# Patient Record
Sex: Male | Born: 1945 | Race: White | Hispanic: No | Marital: Married | Smoking: Former smoker
Health system: Southern US, Community
[De-identification: ages and names within clinical notes are randomized; demographics above are authoritative.]

## PROBLEM LIST (undated history)

## (undated) DIAGNOSIS — H269 Unspecified cataract: Secondary | ICD-10-CM

## (undated) DIAGNOSIS — R911 Solitary pulmonary nodule: Secondary | ICD-10-CM

## (undated) DIAGNOSIS — J189 Pneumonia, unspecified organism: Secondary | ICD-10-CM

## (undated) DIAGNOSIS — J439 Emphysema, unspecified: Secondary | ICD-10-CM

## (undated) DIAGNOSIS — E785 Hyperlipidemia, unspecified: Secondary | ICD-10-CM

## (undated) DIAGNOSIS — D126 Benign neoplasm of colon, unspecified: Secondary | ICD-10-CM

## (undated) DIAGNOSIS — N529 Male erectile dysfunction, unspecified: Secondary | ICD-10-CM

## (undated) DIAGNOSIS — I739 Peripheral vascular disease, unspecified: Secondary | ICD-10-CM

## (undated) DIAGNOSIS — I10 Essential (primary) hypertension: Secondary | ICD-10-CM

## (undated) HISTORY — PX: COLONOSCOPY: SHX174

## (undated) HISTORY — DX: Hyperlipidemia, unspecified: E78.5

## (undated) HISTORY — DX: Male erectile dysfunction, unspecified: N52.9

## (undated) HISTORY — DX: Emphysema, unspecified: J43.9

## (undated) HISTORY — DX: Peripheral vascular disease, unspecified: I73.9

## (undated) HISTORY — DX: Unspecified cataract: H26.9

## (undated) HISTORY — DX: Essential (primary) hypertension: I10

## (undated) HISTORY — DX: Pneumonia, unspecified organism: J18.9

## (undated) HISTORY — DX: Benign neoplasm of colon, unspecified: D12.6

## (undated) HISTORY — DX: Solitary pulmonary nodule: R91.1

---

## 2001-02-07 ENCOUNTER — Encounter: Admission: RE | Admit: 2001-02-07 | Discharge: 2001-03-16 | Payer: Self-pay | Admitting: Internal Medicine

## 2002-06-14 ENCOUNTER — Emergency Department (HOSPITAL_COMMUNITY): Admission: EM | Admit: 2002-06-14 | Discharge: 2002-06-14 | Payer: Self-pay

## 2002-06-19 ENCOUNTER — Ambulatory Visit (HOSPITAL_COMMUNITY): Admission: RE | Admit: 2002-06-19 | Discharge: 2002-06-19 | Payer: Self-pay | Admitting: Vascular Surgery

## 2002-06-19 ENCOUNTER — Encounter: Payer: Self-pay | Admitting: Vascular Surgery

## 2002-06-21 ENCOUNTER — Inpatient Hospital Stay (HOSPITAL_COMMUNITY): Admission: RE | Admit: 2002-06-21 | Discharge: 2002-06-26 | Payer: Self-pay | Admitting: Vascular Surgery

## 2002-06-21 ENCOUNTER — Encounter: Payer: Self-pay | Admitting: Vascular Surgery

## 2002-06-21 ENCOUNTER — Encounter (INDEPENDENT_AMBULATORY_CARE_PROVIDER_SITE_OTHER): Payer: Self-pay | Admitting: *Deleted

## 2002-06-22 ENCOUNTER — Encounter: Payer: Self-pay | Admitting: Vascular Surgery

## 2002-08-17 HISTORY — PX: ABDOMINAL AORTIC ANEURYSM REPAIR: SHX42

## 2006-08-30 ENCOUNTER — Ambulatory Visit: Payer: Self-pay | Admitting: Internal Medicine

## 2006-09-08 ENCOUNTER — Ambulatory Visit: Payer: Self-pay | Admitting: Internal Medicine

## 2007-11-22 ENCOUNTER — Ambulatory Visit (HOSPITAL_BASED_OUTPATIENT_CLINIC_OR_DEPARTMENT_OTHER): Admission: RE | Admit: 2007-11-22 | Discharge: 2007-11-22 | Payer: Self-pay | Admitting: Otolaryngology

## 2007-11-22 ENCOUNTER — Encounter (INDEPENDENT_AMBULATORY_CARE_PROVIDER_SITE_OTHER): Payer: Self-pay | Admitting: Otolaryngology

## 2009-09-05 DIAGNOSIS — H9190 Unspecified hearing loss, unspecified ear: Secondary | ICD-10-CM | POA: Insufficient documentation

## 2009-09-05 DIAGNOSIS — N200 Calculus of kidney: Secondary | ICD-10-CM | POA: Insufficient documentation

## 2009-09-05 DIAGNOSIS — M5136 Other intervertebral disc degeneration, lumbar region: Secondary | ICD-10-CM | POA: Insufficient documentation

## 2009-09-05 DIAGNOSIS — M1990 Unspecified osteoarthritis, unspecified site: Secondary | ICD-10-CM | POA: Insufficient documentation

## 2009-09-05 DIAGNOSIS — E119 Type 2 diabetes mellitus without complications: Secondary | ICD-10-CM | POA: Insufficient documentation

## 2009-09-20 DIAGNOSIS — E538 Deficiency of other specified B group vitamins: Secondary | ICD-10-CM | POA: Insufficient documentation

## 2010-04-22 ENCOUNTER — Ambulatory Visit: Payer: Self-pay | Admitting: Cardiovascular Disease

## 2010-12-30 NOTE — Op Note (Signed)
NAMERONON, FERGER               ACCOUNT NO.:  [id number]   MEDICAL RECORD NO.:  [id number]          PATIENT TYPE:  AMB   LOCATION:  DSC                          FACILITY:  [facility]   PHYSICIAN:  Christopher E. Ezzard Standing, M.D.DATE OF BIRTH:  Jun 28, 1946   DATE OF PROCEDURE:  11/22/2007  DATE OF DISCHARGE:                               OPERATIVE REPORT   PREOPERATIVE DIAGNOSIS:  Squamous cell carcinoma, left lateral tongue.   POSTOPERATIVE DIAGNOSIS:  Squamous cell carcinoma, left lateral tongue.   OPERATION:  Wide excision of squamous cell carcinoma, left lateral  tongue, with CO2 laser.   SURGEON:  Narda Bonds, MD   ANESTHESIA:  General endotracheal.   COMPLICATIONS:  None.   CLINICAL NOTE:  Geoffrey Wood is a 65 year old gentleman who apparently  had abnormality noted on the left lateral tongue, noted by his dentist.  He was seen by Dr. Monia Pouch, who did a wide biopsy of this which revealed  well-differentiated squamous cell carcinoma with clear anterosuperior  margins, but a questionable positive posterior margin.  On examination,  Geoffrey Wood has a well-healed scar on the left lateral tongue, but has some  superficial areas of leukoplakia posterior to the scar in the more  posterior tongue.  He had a few small areas leukoplakia just above the  scar on the left lateral tongue.  He is taken to operating room at this  time for a wide excision of previous biopsy site as well as excision of  remaining of leukoplakia and adenopathy in the posterior left lateral  tongue.   DESCRIPTION OF PROCEDURE:  After adequate endotracheal anesthesia, the  left lateral tongue was evaluated with the microscope and the  abnormalities were marked out with a marking pen and then using CO2  laser at 5 and 6 watts, the mucosa down to the muscle layer was excised,  encompassing the previous scar tissue as well as the area of leukoplakia  posterior to the scar tissue.  A small suture was used to mark the  anterior portion of the excision site and a longer suture was used to  mark the posterior portion of the excision site.  Specimen was sent to  Pathology.  Hemostasis was obtained with electrocautery.  He was awoken  from anesthesia and transferred to the recovery room postop doing well.   DISPOSITION:  Geoffrey Wood is discharged home later this morning on amoxicillin  500 mg b.i.d. for 1 week, Tylenol and Vicodin p.r.n. pain as well as  topical anesthetic for pain control.  We will have him follow up in my  office in 7-10 days for recheck and to review pathology.           ______________________________  Kristine Garbe Ezzard Standing, M.D.     CEN/MEDQ  D:  11/22/2007  T:  11/22/2007  Job:  [id number]   cc:   Dora Sims, M.D.  Redge Gainer. Perini, M.D.

## 2011-01-02 NOTE — Op Note (Signed)
NAMECULLAN, LAUNER                           ACCOUNT NO.:  [id number]   MEDICAL RECORD NO.:  [id number]                   PATIENT TYPE:  INP   LOCATION:  NA                                   FACILITY:  [facility]   PHYSICIAN:  Di Kindle. Edilia Bo, M.D.        DATE OF BIRTH:  03-Apr-1946   DATE OF PROCEDURE:  06/21/2002  DATE OF DISCHARGE:                                 OPERATIVE REPORT   PREOPERATIVE DIAGNOSES:  Inflammatory abdominal aortic aneurysm.   POSTOPERATIVE DIAGNOSES:  Inflammatory abdominal aortic aneurysm.   OPERATION PERFORMED:  Repair of infrarenal inflammatory abdominal aortic  aneurysm with aortobi-iliac bypass graft (16 x 8 graft).   SURGEON:  Di Kindle. Edilia Bo, M.D.   ASSISTANT:  Maple Mirza, P.A.   ANESTHESIA:  General.   INDICATIONS FOR PROCEDURE:  The patient is a 65 year old gentleman who  presented to the emergency department with abdominal pain.  CAT scan showed  evidence of an inflammatory aneurysm but no evidence of leak.  He underwent  an arteriogram and preoperative cardiac evaluation and elective repair was  scheduled.  The patient was not felt to be a good candidate for endovascular  repair as the neck was fairly short and the patient was quite young.  The  procedure and potential complications were discussed with the patient in  detail preoperatively.   DESCRIPTION OF PROCEDURE:  The patient was taken to the operating room and  received a general anesthetic. A Swann-Ganz catheter and arterial line had  been placed by anesthesia.  The abdomen and groins were then prepped and  draped in the usual sterile fashion.  The abdomen was entered through a  midline incision and upon exploration, no other intra-abdominal pathology  was noted with the exception of the inflammatory aneurysm.  I reflected the  transverse colon superiorly and the small bowel to the right.  The duodenum  was carefully mobilized off of the inflammatory aneurysm.   Dissection was  difficult.  In dissecting out to the neck of the aneurysm, the inflammatory  component of the aneurysm was actually stuck to the inferior border of the  left renal vein making dissection difficult.  I was able to eventually  dissect this off and identify the neck clearly.  The vein was fully  mobilized and controlled with a blue vessel loop.  Distally both common  iliac arteries were exposed and umbilical tapes were passed around them such  that clamps could be placed.  The patient was then heparinized with 9000  units of IV heparin.  He also received 25 gm of mannitol.  Of note the  majority of the inflammatory component was quite firm.  There was one area  that was somewhat soft and to be safe, we did sent a frozen section which  showed no evidence of malignancy.  This was on the lateral aspect of the  aorta.   Next the common iliac arteries were clamped  and the infrarenal aorta  clamped.  I then Bovied through this thick inflammatory component of his  aneurysm.  This was approximately 2 cm thick.  The aneurysm was then opened  and lumbars were oversewn with 2-0 silk ties.  The aneurysm was divided  circumferentially proximally.  Distally, the right common iliac artery was  divided for anastomosis.  I then preserved the sympathetic plexus on the  left so I could tunnel the graft underneath this to the left common iliac  artery.  The graft was cut to the appropriate length and using a felt cuff,  the graft was sewn end-to-end to the infrarenal aorta using continuous 3-0  Prolene suture.  At completion, the anastomosis was tested and was  hemostatic.  The right limb of the graft was then pulled down for  anastomosis to the right common iliac artery.  The graft was cut to the  appropriate length, spatulated and sewn end-to-end to the artery using  continuous 5-0 Prolene suture.  Prior to completing the anastomosis, the  vessels were backbled and flushed and the anastomosis  completed.  Flow was  re-established to the right leg which the patient tolerated from the  hemodynamic standpoint.  Next, the graft was brought down beneath the  sympathetic plexus for anastomosis to the left common iliac artery which was  divided circumferentially.  The graft was cut to the appropriate length,  spatulated and sewn end-to-end to the left common iliac artery using  continuous 5-0 Prolene suture.  Prior to completing the anastomosis, the  vessels were backbled and flushed appropriately and the anastomosis was  completed.  Flow was re-established to the left leg which the patient  tolerated from the hemodynamic standpoint.  There were good femoral pulses  at the completion.  Hemostasis was obtained in the wounds.  The anastomosis  were hemostatic.  The thick inflammatory peel was closed over the aortic  graft with a running 2-0 Vicryl.  The retroperitoneal tissue was closed with  running 2-0 Vicryl.  The abdominal contents were returned to their normal  position and then the fascial layer was closed with two interrupted #1 PDS  sutures.  The skin was closed with staples.  Sterile dressing was applied.  The patient tolerated the procedure well and was transferred to the recovery  room in satisfactory condition.  All sponge and needle counts were correct.                                               Di Kindle. Edilia Bo, M.D.    CSD/MEDQ  D:  06/21/2002  T:  06/21/2002  Job:  [id number]   cc:   Loraine Leriche A. Perini, M.D.

## 2011-01-02 NOTE — Consult Note (Signed)
NAMEGENEVIEVE, RITZEL                           ACCOUNT NO.:  [id number]   MEDICAL RECORD NO.:  [id number]                   PATIENT TYPE:  EMS   LOCATION:  ED                                   FACILITY[facility]  [facility]   PHYSICIAN:  Di Kindle. Edilia Bo, M.D.        DATE OF BIRTH:  [birth year]   DATE OF CONSULTATION:  06/14/2002  DATE OF DISCHARGE:                                   CONSULTATION   VASCULAR SURGERY CONSULTATION:   REASON FOR CONSULTATION:  Inflammatory abdominal aortic aneurysm with back  pain.   HISTORY:  The patient is a pleasant 65 year old gentleman who has had  chronic low back pain since he was a child.  He apparently has significant  degenerative disk disease.  Over the last month his back pain has gradually  increased.  This morning he noted that his pain had also continued to  increase and was now radiating somewhat to his scrotum.  He has had no  abdominal pain.  He has had kidney stones in the past but did not note any  hematuria.  The pain is fairly constant and is gradually alleviated some.  He has had no associated symptoms.  Specifically he denies any nausea,  vomiting, diarrhea, constipation, melena, hematochezia, or other GI  symptoms.   The patient was seen in the emergency department at [facility] and a CAT  scan was obtained which showed an inflammatory abdominal aortic aneurysm.  There was no evidence of leak.   PAST MEDICAL HISTORY:  The patient's past medical history is significant for  type 2 diabetes (diet controlled), and hyperlipidemia.  He denies any  history of hypertension, history of previous myocardial infarction, or  history of congestive heart failure.   FAMILY HISTORY:  There is no history of aneurysmal disease that he is aware  of.  His mother died with congestive heart failure at age 78.  He is unaware  of any other history of premature cardiovascular disease.   SOCIAL HISTORY:  He works as a Transport planner and remains quite  active.  He  plays golf 2-3 times a week.  He remains fairly active.  He is married.  He  does not have any children.  He smokes a pack per day of cigarettes and has  been smoking for over 40 years.   ALLERGIES:  The patient has no known drug allergies.   MEDICATIONS:  1. Lipitor 10 mg p.o. q.d.  2. Aspirin 325 mg p.o. q.d.  3. Niacin 500 mg p.o. q.i.d.  4. Vitamin E 400 international units p.o. q.d.  5. Vitamin C 250 mg p.o. q.d.   REVIEW OF SYSTEMS:  The patient has had no recent weight loss, weight gain,  or problems with his appetite.  He has had no fever, night sweats, or  chills.  He has had no chest pain, chest pressure, shortness of breath,  orthopnea or dyspnea on exertion.  He  has had no history of bronchitis,  asthma, or wheezing.  He does have a history of kidney stones in the past  but has had no dysuria or frequency.  He has had no history of arthritis.  He does have degenerative joint disease as described above.  He has had no  history of claudication, rest pain, or nonhealing ulcers.  He has had no  history of DVT or pulmonary embolus.  He has had no headaches, seizures, or  blackouts.   PHYSICAL EXAMINATION:  GENERAL: The patient is in no obvious distress.  VITAL SIGNS: Temperature is 96.8, blood pressure 148/94, heart rate 94,  respiratory rate 20.  NECK: There is no cervical lymphadenopathy.  I do not detect any carotid  bruits.  LUNGS: Clear bilaterally to auscultation.  CARDIAC: Regular rate and rhythm.  ABDOMEN: Soft and nontender.  His aneurysm is palpable and nontender.  EXTREMITIES: He has palpable femoral, popliteal, dorsalis pedis and  posterior tibial pulses bilaterally.  There is no evidence of atheroembolic  disease.  He has no significant lower extremity swelling.  NEUROLOGIC: Nonfocal.   DIAGNOSTIC STUDIES:  I reviewed his CT scan which does show a small  inflammatory aneurysm.  When I measure the aneurysm, the maximum luminal  diameter is 3.0  cm.  If I include the inflammatory peel, the aneurysm  measures 5 cm in maximum diameter.  There is no evidence of leak.  He  appears to have an adequate length neck for endovascular repair of his  aneurysm if this is considered.  No other intra-abdominal pathology is  noted.  There is no hydronephrosis.   EKG shows a normal sinus rhythm.  Chest x-ray is unremarkable on my  interpretation.   LABORATORY EVALUATION:  No blood in the urine.  White count 7.7, H&H 14.3  and 40.8, platelet count 286.  Prothrombin time is 13.3 seconds with an INR  of 1.0.  PTT is 32 seconds.  Sodium 139, potassium 3.9, chloride 107 CO2 27,  glucose 136, BUN 9, creatinine 0.9.  AST and ALT are normal.  Alk phos is 94  and normal.  Total bili 0.06 and normal.  Amylase is 67, lipase is 50 - all  of which within normal limits.   IMPRESSION:  The patient does have a small infrarenal inflammatory abdominal  aortic aneurysm.  I explained to the patient and his wife that these  aneurysms do typically tend to be more likely to cause symptoms.  On the  other hand they are also less likely to rupture.  This aneurysm is fairly  small but given that this may be causing symptoms I think it does need to be  addressed electively.  There is currently no evidence of it leaking and I do  not think it needs to be repaired urgently.  I plan on proceeding with a  Cardiolite and arteriogram to plan elective repair.  We discussed the  options of operative repair versus an endovascular repair.  Specifically we  addressed the advantages and disadvantages of each.  Based on his CAT scan  it appears that he could potentially be a candidate for endovascular repair  although this will depend on the results of his arteriogram.  He tends to be  leaning in that direction.  He does understand that this would require  lifelong followup as there is a small risk of continued aneurysm enlargement with endovascular repair.  Given the inflammatory  nature of the aneurysm  however, I think this  would be reasonable.  We will make further  recommendations pending the results of his Cardiolite and arteriogram and  plan elective repair in the near future.  He knows to call immediately if he  has any increasing abdominal symptoms or back pain.  I have given him a  prescription for 30 Darvocet.                                               Di Kindle. Edilia Bo, M.D.    CSD/MEDQ  D:  06/14/2002  T:  06/14/2002  Job:  [id number]   cc:   Loraine Leriche A. Perini, M.D.   [facility]

## 2011-01-02 NOTE — Discharge Summary (Signed)
Geoffrey Wood, Geoffrey Wood                           ACCOUNT NO.:  [id number]   MEDICAL RECORD NO.:  [id number]                   PATIENT TYPE:  INP   LOCATION:  2037                                 FACILITY:  [facility]   PHYSICIAN:  Geoffrey Wood, M.D.        DATE OF BIRTH:  1946/04/23   DATE OF ADMISSION:  06/21/2002  DATE OF DISCHARGE:  06/26/2002                                 DISCHARGE SUMMARY   ADMISSION DIAGNOSIS:  Inflammatory abdominal aortic aneurysm.   DISCHARGE DIAGNOSES:  1. Inflammatory abdominal aortic aneurysm.  2. History of diet controlled diabetes mellitus.  3. Hyperlipidemia.  4. 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   PROCEDURE:  Repair of infrarenal inflammatory abdominal aortic aneurysm with  an aorto bi-iliac 16 x 8 bypass graft.   HISTORY OF PRESENT ILLNESS:  The patient is a 65 year old white male who  presented to the ER at [facility] approximately one week prior to  this admission complaining of abdominal pain which radiated into his back. A  CT scan was performed at that time which revealed an inflammatory abdominal  aortic aneurysm with no evidence of leakage. He was seen at that time by Dr.  Waverly Ferrari. The aneurysm measures around 5 cm in maximum  diameter.  It was felt that despite the small size of the aneurysm, it could  potentially be causing  his symptoms. It was felt that  he would benefit  from surgical  repair electively.  It was initially felt that he might be a  candidate for intravascular stent grafting, but upon further evaluation, it  was noted that the neck of the aneurysm was fairly short, therefore, making  him a poor candidate. He was referred for cardiac clearance and was seen at  [facility]. He was cleared to proceed with operative  intervention.   HOSPITAL COURSE:  He was admitted to [facility] on June 21, 2002, and was taken to the operating room where he underwent repair   of his  abdominal aortic aneurysm as described above. He tolerated the procedure  well and was transferred to the [facility] in stable condition. He was extubated  after surgery. He was kept n.p.o. initially until his bowel function began  to improve. He had  an  NG tube  overnight the first night postoperatively  but then this was removed on postoperative day #1. His pain control was well  maintained on a PCA pump.   By postoperative day #2 he was able to be transferred to the floor. Overall  he has done well. By postoperative day #2 he was  passing flatus. By  postoperative day #3 he had a small bowel movement. At that time he was  started on a clear liquid diet. He has tolerated this well and has continued  to have good bowel function, and by postoperative day #4, he has been  advanced to a soft regular  diet.   Overall his abdominal incision is healing well. His abdomen has remained  soft, nontender and nondistended. He has maintained good peripheral pulses  postoperatively. His potassium was noted to be a little low on postoperative  day #4 at 2.6. This is currently being replaced and will be reevaluated with  a basic metabolic panel on the day of discharge.   Otherwise he has done well. He has remained afebrile and vital signs have  been stable. He is ambulating in the halls without difficulty. He has been  weaned off supplemental oxygen and is maintaining oxygen sats of 95% on room  air. His IV fluids have been discontinued. He has been switched to p.o. pain  medication and this is controlling his pain quite well. It is felt that if  he continues to remain stable and tolerates a soft diet well he will be  ready for discharge home on June 26, 2002.   DISCHARGE MEDICATIONS:  1. Tylox 1 to 2 q.4h. p.r.n. pain.  2. Enteric coated aspirin 325 mg q.d.  3. Lipitor 10 mg q.d.  4. Vitamin E 400 IU q.d.  5. Niacin 2000 mg q.d.  6. Vitamin C 250 mg q.d.   DISCHARGE INSTRUCTIONS:  He is  to refrain from driving, heavy lifting or  strenuous activity. He may continue daily walking using his incentive  spirometer. He is asked to shower daily and clean his incisions in soap and  water.   FOLLOW UP:  He will be seen in the CVTS office in one week for staple  removal with one of the nurses. He will then follow up with Dr. Waverly Ferrari in three weeks and have ankle-brachial indices repeated at that  time. He is reminded to call  our office if he experiences any fever greater  than 101, purulent drainage from the incision or redness of the incision  site.     Coral Ceo, P.A.                        Geoffrey Wood, M.D.    GC/MEDQ  D:  06/25/2002  T:  06/26/2002  Job:  [id number]   cc:   Loraine Leriche A. Perini, M.D.   [facility]

## 2011-01-02 NOTE — Op Note (Signed)
NAMEMARON, STANZIONE                           ACCOUNT NO.:  [id number]   MEDICAL RECORD NO.:  [id number]                   PATIENT TYPE:  OIB   LOCATION:  2899                                 FACILITY:  [facility]   PHYSICIAN:  Di Kindle. Edilia Bo, M.D.        DATE OF BIRTH:  1945/12/04   DATE OF PROCEDURE:  06/19/2002  DATE OF DISCHARGE:                                 OPERATIVE REPORT   PREOPERATIVE DIAGNOSES:  Inflammatory abdominal aortic aneurysm.   POSTOPERATIVE DIAGNOSES:  Inflammatory abdominal aortic aneurysm.   OPERATION PERFORMED:  1. Aortogram.  2. Bilateral iliac arteriogram.  3. Bilateral lower extremity runoff.   SURGEON:  Di Kindle. Edilia Bo, M.D.   ANESTHESIA:  Local with sedation.   DESCRIPTION OF PROCEDURE:  The patient was taken to the operating room and  sedated with 1 mg of Versed and 50 mcg of fentanyl.  Both groins were  prepped and draped in the usual sterile fashion.  After the skin was  infiltrated with 1% lidocaine, the right common femoral artery was  cannulated and a guidewire introduced into the infrarenal aorta under  fluoroscopic control.  A 5 French sheath was passed over the wire and the  dilator was removed.  A marker pigtail catheter was positioned at the L1  vertebral body and flush aortogram obtained.  A lateral projection was also  obtained.  The catheter was then repositioned about the aortic bifurcation  and oblique iliac projections were obtained.  Bilateral lower extremity  runoff films were then obtained.   FINDINGS:  There were single renal arteries bilaterally and no significant  renal artery stenosis identified.  The superior mesenteric artery was widely  patent as was the inferior mesenteric artery.  There was an AP angulation of  the neck and on the lateral projection it appears that the length of the  neck from the lowest renal artery to the beginning of the aneurysm was  approximately 2 cm.  It only looks to be  approximately 1 cm on the AP  projection; however, on the lateral projection because of the AP angulation  of the neck, the neck is actually slightly longer.  The aneurysm ends at the  bifurcation.  Both common iliac, external iliac and hypogastric arteries are  widely patent.  The common femoral, superficial femoral and deep femoral  arteries are patent bilaterally.  The popliteal arteries are patent and  there is three-vessel runoff bilaterally.   CONCLUSION:  Infrarenal abdominal aortic aneurysm.  The size of the aneurysm  could not be determined by the study.  No evidence of significant aortoiliac  or infrainguinal arterial occlusive disease.  Widely patent celiac axis,  SMA, and inferior mesenteric arteries.  Di Kindle. Edilia Bo, M.D.   CSD/MEDQ  D:  06/19/2002  T:  06/19/2002  Job:  [id number]

## 2011-02-09 ENCOUNTER — Other Ambulatory Visit: Payer: Self-pay | Admitting: *Deleted

## 2011-02-09 MED ORDER — RAMIPRIL 5 MG PO CAPS: 5.0000 mg | ORAL_CAPSULE | Freq: Every day | ORAL | Status: DC

## 2011-02-09 NOTE — Telephone Encounter (Signed)
Fax received from pharmacy. Refill completed. Jodette Ruthmary Occhipinti RN  

## 2011-04-13 ENCOUNTER — Encounter: Payer: Self-pay | Admitting: Cardiovascular Disease

## 2011-04-21 ENCOUNTER — Encounter: Payer: Self-pay | Admitting: Cardiovascular Disease

## 2011-04-21 ENCOUNTER — Ambulatory Visit (INDEPENDENT_AMBULATORY_CARE_PROVIDER_SITE_OTHER): Payer: 59 | Admitting: Cardiovascular Disease

## 2011-04-21 VITALS — BP 118/72 | HR 63 | Ht 70.0 in | Wt 197.6 lb

## 2011-04-21 DIAGNOSIS — E785 Hyperlipidemia, unspecified: Secondary | ICD-10-CM | POA: Insufficient documentation

## 2011-04-21 DIAGNOSIS — I10 Essential (primary) hypertension: Secondary | ICD-10-CM

## 2011-04-21 NOTE — Assessment & Plan Note (Addendum)
Stable.  We'll continue with the same medications. We'll recheck his fasting lipid profile in one year unless it is checked by general medical Dr.

## 2011-04-21 NOTE — Assessment & Plan Note (Signed)
Stable. Continue his current meds

## 2011-04-21 NOTE — Progress Notes (Signed)
Hardie Shackleton Date of Birth  January 17, 1946 [facility] / [facility] [address]     [address] [place], [place]  [address] [phone]  Fax  [phone]  History of Present Illness:  Geoffrey Wood is a 65 year old gentleman with a history of hypertension, hyperlipidemia, and peripheral vascular disease. He's done well since I saw him a year ago. Unfortunately, he continues to smoke. He's not had any episodes of chest pain or shortness of breath.  Current Outpatient Prescriptions on File Prior to Visit  Medication Sig Dispense Refill  . Ascorbic Acid (VITAMIN C PO) Take by mouth daily.        Marland Kitchen aspirin 81 MG tablet Take 81 mg by mouth daily.        Marland Kitchen atorvastatin (LIPITOR) 40 MG tablet Take 80 mg by mouth daily.       . Cholecalciferol (VITAMIN D PO) Take by mouth daily.        . metFORMIN (GLUMETZA) 1000 MG (MOD) 24 hr tablet Take 1,000 mg by mouth daily with breakfast.        . niacin 500 MG tablet Take 500 mg by mouth 3 (three) times daily.        . Omega-3-acid Ethyl Esters (LOVAZA PO) Take by mouth 2 (two) times daily.        . ramipril (ALTACE) 5 MG capsule Take 1 capsule (5 mg total) by mouth daily.  90 capsule  0    No Known Allergies  Past Medical History  Diagnosis Date  . Hypertension   . Peripheral vascular disease   . Erectile dysfunction   . Hyperlipidemia   . Diabetes mellitus   . Emphysema     Past Surgical History  Procedure Date  . Abdominal aortic aneurysm repair 2004    History  Smoking status  . Current Everyday Smoker  Smokeless tobacco  . Not on file    History  Alcohol Use  . Yes    Family History  Problem Relation Age of Onset  . Heart failure Mother   . Diabetes Mother   . Cancer Father     lung    Reviw of Systems:  Reviewed in the HPI.  All other systems are negative.  Physical Exam: BP 118/72  Pulse 63  Ht 5\' 10"  (1.778 m)  Wt 197 lb 9.6 oz (89.631 kg)  BMI 28.35 kg/m2 The patient is alert and  oriented x 3.  The mood and affect are normal.   Skin: warm and dry.  Color is normal.    HEENT:   the sclera are nonicteric.  The mucous membranes are moist.  The carotids are 2+ without bruits.  There is no thyromegaly.  There is no JVD.    Lungs: clear.  The chest wall is non tender.    Heart: regular rate with a normal S1 and S2.  There are no murmurs, gallops, or rubs. The PMI is not displaced.     Abdomen: good bowel sounds.  There is no guarding or rebound.  There is no hepatosplenomegaly or tenderness.  There are no masses.   Extremities:  no clubbing, cyanosis, or edema.  The legs are without rashes.  The distal pulses are intact.   Neuro:  Cranial nerves II - XII are intact.  Motor and sensory functions are intact.    The gait is normal.  ECG: Normal sinus rhythm.  Assessment / Plan:

## 2011-05-12 LAB — BASIC METABOLIC PANEL
BUN: 8
Calcium: 9.3
GFR calc non Af Amer: >60
Glucose, Bld: 211 — ABNORMAL HIGH
Potassium: 4.1

## 2011-09-11 ENCOUNTER — Encounter: Payer: Self-pay | Admitting: Internal Medicine

## 2011-12-02 ENCOUNTER — Encounter: Payer: Self-pay | Admitting: Internal Medicine

## 2011-12-10 ENCOUNTER — Ambulatory Visit (AMBULATORY_SURGERY_CENTER): Payer: 59 | Admitting: *Deleted

## 2011-12-10 ENCOUNTER — Encounter: Payer: Self-pay | Admitting: Internal Medicine

## 2011-12-10 VITALS — Ht 72.0 in | Wt 200.0 lb

## 2011-12-10 DIAGNOSIS — Z1211 Encounter for screening for malignant neoplasm of colon: Secondary | ICD-10-CM

## 2011-12-10 MED ORDER — PEG-KCL-NACL-NASULF-NA ASC-C 100 G PO SOLR: ORAL | Status: DC

## 2011-12-24 ENCOUNTER — Encounter: Payer: Self-pay | Admitting: Internal Medicine

## 2011-12-24 ENCOUNTER — Ambulatory Visit (AMBULATORY_SURGERY_CENTER): Payer: 59 | Admitting: Internal Medicine

## 2011-12-24 VITALS — BP 136/77 | HR 65 | Temp 97.5°F | Resp 19 | Ht 72.0 in | Wt 200.0 lb

## 2011-12-24 DIAGNOSIS — D126 Benign neoplasm of colon, unspecified: Secondary | ICD-10-CM

## 2011-12-24 DIAGNOSIS — K648 Other hemorrhoids: Secondary | ICD-10-CM

## 2011-12-24 DIAGNOSIS — Z860100 Personal history of colon polyps, unspecified: Secondary | ICD-10-CM | POA: Insufficient documentation

## 2011-12-24 DIAGNOSIS — Z1211 Encounter for screening for malignant neoplasm of colon: Secondary | ICD-10-CM

## 2011-12-24 DIAGNOSIS — K5730 Diverticulosis of large intestine without perforation or abscess without bleeding: Secondary | ICD-10-CM

## 2011-12-24 MED ORDER — SODIUM CHLORIDE 0.9 % IV SOLN: 500.0000 mL | INTRAVENOUS | Status: DC

## 2011-12-24 NOTE — Op Note (Signed)
 [facility] [address] [place], [place]  [address]  COLONOSCOPY PROCEDURE REPORT  PATIENT:  Geoffrey Wood, Geoffrey Wood  MR#:  [id number] BIRTHDATE:  [birth year], 66 yrs. old  GENDER:  male ENDOSCOPIST:  Iva Boop, MD, [facility]  PROCEDURE DATE:  12/24/2011 PROCEDURE:  Colonoscopy with biopsy and snare polypectomy ASA CLASS:  Class II INDICATIONS:  surveillance and high-risk screening, history of pre-cancerous (adenomatous) colon polyps 4 adenomas largest 12 mm 2004 1 diminutive adenoma 2005 no polyps 2008 MEDICATIONS:   These medications were titrated to patient response per physician's verbal order, Fentanyl 50 mcg IV, Versed 8 mg IV  DESCRIPTION OF PROCEDURE:   After the risks benefits and alternatives of the procedure were thoroughly explained, informed consent was obtained.  Digital rectal exam was performed and revealed no abnormalities and normal prostate.   The LB [id number] endoscope was introduced through the anus and advanced to the cecum, which was identified by both the appendix and ileocecal valve, without limitations.  The quality of the prep was excellent, using MoviPrep.  The instrument was then slowly withdrawn as the colon was fully examined. <<PROCEDUREIMAGES>>  FINDINGS:  Four polyps were found. Cecum (2 mm cold biopsy removal), transverse (4mm cold snare not recovered), sigmoid (2 4-5 mm cold snare).  Moderate diverticulosis was found in the sigmoid colon.  This was otherwise a normal examination of the colon. Includes right colon retroflexion.   Retroflexed views in the rectum revealed internal hemorrhoids.    The time to cecum = 4:40 minutes. The scope was then withdrawn in 13:34 minutes from the cecum and the procedure completed. COMPLICATIONS:  None ENDOSCOPIC IMPRESSION: 1) Four polyps removed, all diminutive. One not recovered. 2) Moderate diverticulosis in the sigmoid colon 3) Internal hemorrhoids 4) Otherwise normal examination,  excellent prep 5) Personal history of adenomatous polyp removal  REPEAT EXAM:  In for Colonoscopy, pending biopsy results.  Iva Boop, MD, [facility]  CC:  The Patient and Rodrigo Ran, MD  n. Rosalie Doctor:   Iva Boop at 12/24/2011 12:33 PM  Rae Lips, [phone]

## 2011-12-24 NOTE — Patient Instructions (Signed)
YOU HAD AN ENDOSCOPIC PROCEDURE TODAY AT THE [facility]: Refer to the procedure report that was given to you for any specific questions about what was found during the examination.  If the procedure report does not answer your questions, please call your gastroenterologist to clarify.  If you requested that your care partner not be given the details of your procedure findings, then the procedure report has been included in a sealed envelope for you to review at your convenience later.  YOU SHOULD EXPECT: Some feelings of bloating in the abdomen. Passage of more gas than usual.  Walking can help get rid of the air that was put into your GI tract during the procedure and reduce the bloating. If you had a lower endoscopy (such as a colonoscopy or flexible sigmoidoscopy) you may notice spotting of blood in your stool or on the toilet paper. If you underwent a bowel prep for your procedure, then you may not have a normal bowel movement for a few days.  DIET: Your first meal following the procedure should be a light meal and then it is ok to progress to your normal diet.  A half-sandwich or bowl of soup is an example of a good first meal.  Heavy or fried foods are harder to digest and may make you feel nauseous or bloated.  Likewise meals heavy in dairy and vegetables can cause extra gas to form and this can also increase the bloating.  Drink plenty of fluids but you should avoid alcoholic beverages for 24 hours.  ACTIVITY: Your care partner should take you home directly after the procedure.  You should plan to take it easy, moving slowly for the rest of the day.  You can resume normal activity the day after the procedure however you should NOT DRIVE or use heavy machinery for 24 hours (because of the sedation medicines used during the test).    SYMPTOMS TO REPORT IMMEDIATELY: A gastroenterologist can be reached at any hour.  During normal business hours, 8:30 AM to 5:00 PM Monday through Friday,  call [phone].  After hours and on weekends, please call the GI answering service at [phone] who will take a message and have the physician on call contact you.   Following lower endoscopy (colonoscopy or flexible sigmoidoscopy):  Excessive amounts of blood in the stool  Significant tenderness or worsening of abdominal pains  Swelling of the abdomen that is new, acute  Fever of 100F or higher  Following upper endoscopy (EGD)  Vomiting of blood or coffee ground material  New chest pain or pain under the shoulder blades  Painful or persistently difficult swallowing  New shortness of breath  Fever of 100F or higher  Black, tarry-looking stools  FOLLOW UP: If any biopsies were taken you will be contacted by phone or by letter within the next 1-3 weeks.  Call your gastroenterologist if you have not heard about the biopsies in 3 weeks.  Our staff will call the home number listed on your records the next business day following your procedure to check on you and address any questions or concerns that you may have at that time regarding the information given to you following your procedure. This is a courtesy call and so if there is no answer at the home number and we have not heard from you through the emergency physician on call, we will assume that you have returned to your regular daily activities without incident.  SIGNATURES/CONFIDENTIALITY: You and/or your care   partner have signed paperwork which will be entered into your electronic medical record.  These signatures attest to the fact that that the information above on your After Visit Summary has been reviewed and is understood.  Full responsibility of the confidentiality of this discharge information lies with you and/or your care-partner.  

## 2011-12-24 NOTE — Progress Notes (Signed)
Patient did not have preoperative order for IV antibiotic SSI prophylaxis. (G8918)  Patient did not experience any of the following events: a burn prior to discharge; a fall within the facility; wrong site/side/patient/procedure/implant event; or a hospital transfer or hospital admission upon discharge from the facility. (G8907)  

## 2011-12-25 ENCOUNTER — Telehealth: Payer: Self-pay

## 2011-12-25 NOTE — Telephone Encounter (Signed)
  Follow up Call-  Call back number 12/24/2011  Post procedure Call Back phone  # [phone]  Permission to leave phone message Yes     Patient questions:  Do you have a fever, pain , or abdominal swelling? no Pain Score  0 *  Have you tolerated food without any problems? yes  Have you been able to return to your normal activities? yes  Do you have any questions about your discharge instructions: Diet   no Medications  no Follow up visit  no  Do you have questions or concerns about your Care? no  Actions: * If pain score is 4 or above: No action needed, pain <4.

## 2011-12-28 LAB — GLUCOSE, CAPILLARY

## 2011-12-31 ENCOUNTER — Encounter: Payer: Self-pay | Admitting: Internal Medicine

## 2011-12-31 NOTE — Progress Notes (Signed)
Quick Note:  2 diminutive adenomas Repeat colonoscopy 5 years about 12/2016 ______

## 2012-06-20 ENCOUNTER — Ambulatory Visit (INDEPENDENT_AMBULATORY_CARE_PROVIDER_SITE_OTHER): Payer: 59 | Admitting: Cardiovascular Disease

## 2012-06-20 ENCOUNTER — Encounter: Payer: Self-pay | Admitting: Cardiovascular Disease

## 2012-06-20 VITALS — BP 126/88 | HR 76 | Ht 72.0 in | Wt 202.0 lb

## 2012-06-20 DIAGNOSIS — I10 Essential (primary) hypertension: Secondary | ICD-10-CM

## 2012-06-20 MED ORDER — RAMIPRIL 5 MG PO CAPS: 5.0000 mg | ORAL_CAPSULE | Freq: Every day | ORAL | Status: DC

## 2012-06-20 NOTE — Assessment & Plan Note (Signed)
Geoffrey Wood is doing well.  His BP has been well controlled.  I have asked him to exercise more regularly.  Will see him in a month.

## 2012-06-20 NOTE — Patient Instructions (Signed)
Your physician wants you to follow-up in: 1 year  You will receive a reminder letter in the mail two months in advance. If you don't receive a letter, please call our office to schedule the follow-up appointment.   Your physician recommends that you return for a FASTING lipid profile: 1 year   

## 2012-06-20 NOTE — Progress Notes (Signed)
Geoffrey Wood Date of Birth  October 23, 1945 [facility] [address]     [address] [place], [place]  [address] [phone]  Fax  [phone]  Problem List 1. HTN 2. Hyperlipidemia 3. PVD 4. Diabetes Mellitus   History of Present Illness:  Geoffrey Wood is a 66 year old gentleman with a history of hypertension, hyperlipidemia, and peripheral vascular disease. He's done well since I saw him a year ago. Unfortunately, he continues to smoke. He's not had any episodes of chest pain or shortness of breath.  Playing lots of golf.  He still works - Building services engineer.  He still smokes - he's trying the new E-cigarettes.   Current Outpatient Prescriptions on File Prior to Visit  Medication Sig Dispense Refill  . aspirin 81 MG tablet Take 81 mg by mouth daily.        . Cholecalciferol (VITAMIN D PO) Take 1,000 Units by mouth daily.       . Cyanocobalamin (VITAMIN B 12 PO) Take 500 mcg by mouth daily.       Marland Kitchen LIPITOR 80 MG tablet Take 1 tablet by mouth Daily.      Marland Kitchen LOVAZA 1 G capsule Take 1 capsule by mouth 4 times daily.      . metFORMIN (GLUMETZA) 1000 MG (MOD) 24 hr tablet Take 1,000 mg by mouth 2 (two) times daily before lunch and supper.       . niacin 500 MG tablet Take 500 mg by mouth 4 (four) times daily.        Current Facility-Administered Medications on File Prior to Visit  Medication Dose Route Frequency Provider Last Rate Last Dose  . 0.9 %  sodium chloride infusion  500 mL Intravenous Continuous Iva Boop, MD        No Known Allergies  Past Medical History  Diagnosis Date  . Hypertension   . Peripheral vascular disease   . Erectile dysfunction   . Hyperlipidemia   . Diabetes mellitus   . Emphysema   . Adenomatous colon polyp     Past Surgical History  Procedure Date  . Abdominal aortic aneurysm repair 2004  . Colonoscopy multiple since 2004    History  Smoking status  . Current Every Day Smoker -- 1.0 packs/day    Smokeless tobacco  . Never Used    History  Alcohol Use  . Yes    Comment: not even 1 drink a week    Family History  Problem Relation Age of Onset  . Heart failure Mother   . Diabetes Mother   . Cancer Father     lung    Reviw of Systems:  Reviewed in the HPI.  All other systems are negative.  Physical Exam: BP 126/88  Pulse 76  Ht 6' (1.829 m)  Wt 202 lb (91.627 kg)  BMI 27.40 kg/m2  SpO2 96% The patient is alert and oriented x 3.  The mood and affect are normal.   Skin: warm and dry.  Color is normal.    HEENT:   the sclera are nonicteric.  The mucous membranes are moist.  The carotids are 2+ without bruits.  There is no thyromegaly.  There is no JVD.    Lungs: clear.  The chest wall is non tender.    Heart: regular rate with a normal S1 and S2.  There are no murmurs, gallops, or rubs. The PMI is not displaced.     Abdomen: good bowel  sounds.  There is no guarding or rebound.  There is no hepatosplenomegaly or tenderness.  There are no masses.   Extremities:  no clubbing, cyanosis, or edema.  The legs are without rashes.  The distal pulses are intact.   Neuro:  Cranial nerves II - XII are intact.  Motor and sensory functions are intact.    The gait is normal.  ECG:  Assessment / Plan:

## 2013-02-23 DIAGNOSIS — N529 Male erectile dysfunction, unspecified: Secondary | ICD-10-CM | POA: Insufficient documentation

## 2013-06-09 ENCOUNTER — Other Ambulatory Visit: Payer: Self-pay | Admitting: Dermatology

## 2013-06-20 ENCOUNTER — Encounter: Payer: Self-pay | Admitting: Cardiovascular Disease

## 2013-06-29 ENCOUNTER — Ambulatory Visit (INDEPENDENT_AMBULATORY_CARE_PROVIDER_SITE_OTHER): Payer: 59 | Admitting: Cardiovascular Disease

## 2013-06-29 ENCOUNTER — Encounter: Payer: Self-pay | Admitting: Cardiovascular Disease

## 2013-06-29 ENCOUNTER — Encounter (INDEPENDENT_AMBULATORY_CARE_PROVIDER_SITE_OTHER): Payer: Self-pay

## 2013-06-29 VITALS — BP 131/84 | HR 80 | Ht 72.0 in | Wt 192.8 lb

## 2013-06-29 DIAGNOSIS — Z23 Encounter for immunization: Secondary | ICD-10-CM

## 2013-06-29 DIAGNOSIS — I10 Essential (primary) hypertension: Secondary | ICD-10-CM

## 2013-06-29 MED ORDER — RAMIPRIL 5 MG PO CAPS: 5.0000 mg | ORAL_CAPSULE | Freq: Every day | ORAL | Status: DC

## 2013-06-29 NOTE — Progress Notes (Signed)
Hardie Shackleton Date of Birth  07-26-1946 [facility] [address]     [address] [place], [place]  [address] [phone]  Fax  [phone]  Problem List 1. HTN 2. Hyperlipidemia 3. PVD 4. Diabetes Mellitus   History of Present Illness:  Geoffrey Wood is a 67 year old gentleman with a history of hypertension, hyperlipidemia, and peripheral vascular disease. He's done well since I saw him a year ago. Unfortunately, he continues to smoke. He's not had any episodes of chest pain or shortness of breath.  Playing lots of golf.  He still works - Building services engineer.  He still smokes - he's trying the new E-cigarettes.   Nov. 13, 2014:  Not much new.  Still smoiking  Current Outpatient Prescriptions on File Prior to Visit  Medication Sig Dispense Refill  . aspirin 81 MG tablet Take 81 mg by mouth daily.        . Cholecalciferol (VITAMIN D PO) Take 1,000 Units by mouth daily.       . Cyanocobalamin (VITAMIN B 12 PO) Take 500 mcg by mouth daily.       Marland Kitchen LIPITOR 80 MG tablet Take 1 tablet by mouth Daily.      Marland Kitchen LOVAZA 1 G capsule Take 1 capsule by mouth 4 times daily.      . metFORMIN (GLUMETZA) 1000 MG (MOD) 24 hr tablet Take 1,000 mg by mouth 2 (two) times daily before lunch and supper.       . niacin 500 MG tablet Take 500 mg by mouth 4 (four) times daily.       . ramipril (ALTACE) 5 MG capsule Take 1 capsule (5 mg total) by mouth daily.  30 capsule  11   No current facility-administered medications on file prior to visit.    No Known Allergies  Past Medical History  Diagnosis Date  . Hypertension   . Peripheral vascular disease   . Erectile dysfunction   . Hyperlipidemia   . Diabetes mellitus   . Emphysema   . Adenomatous colon polyp     Past Surgical History  Procedure Laterality Date  . Abdominal aortic aneurysm repair  2004  . Colonoscopy  multiple since 2004    History  Smoking status  . Current Every Day Smoker -- 1.00  packs/day  Smokeless tobacco  . Never Used    History  Alcohol Use  . Yes    Comment: not even 1 drink a week    Family History  Problem Relation Age of Onset  . Heart failure Mother   . Diabetes Mother   . Cancer Father     lung    Reviw of Systems:  Reviewed in the HPI.  All other systems are negative.  Physical Exam: BP 131/84  Pulse 80  Ht 6' (1.829 m)  Wt 192 lb 12.8 oz (87.454 kg)  BMI 26.14 kg/m2 The patient is alert and oriented x 3.  The mood and affect are normal.   Skin: warm and dry.  Color is normal.    HEENT:   the sclera are nonicteric.  The mucous membranes are moist.  The carotids are 2+ without bruits.  There is no thyromegaly.  There is no JVD.    Lungs: clear.  The chest wall is non tender.    Heart: regular rate with a normal S1 and S2.  There are no murmurs, gallops, or rubs. The PMI is not displaced.  Abdomen: good bowel sounds.  There is no guarding or rebound.  There is no hepatosplenomegaly or tenderness.  There are no masses.   Extremities:  no clubbing, cyanosis, or edema.  The legs are without rashes.  The distal pulses are intact.   Neuro:  Cranial nerves II - XII are intact.  Motor and sensory functions are intact.    The gait is normal.  ECG: Nov. 13, 2014:  NSR at 63; LVH  Assessment / Plan:

## 2013-06-29 NOTE — Patient Instructions (Signed)
Your physician wants you to follow-up in: 1 year  You will receive a reminder letter in the mail two months in advance. If you don't receive a letter, please call our office to schedule the follow-up appointment.  Your physician recommends that you continue on your current medications as directed. Please refer to the Current Medication list given to you today.  

## 2013-06-29 NOTE — Assessment & Plan Note (Signed)
Geoffrey Wood is doing well.  Unfortunately, he is still smoking.   Will continue his current meds   Ill see him in 1 year

## 2013-08-28 ENCOUNTER — Other Ambulatory Visit: Payer: Self-pay | Admitting: Internal Medicine

## 2013-08-28 DIAGNOSIS — J438 Other emphysema: Secondary | ICD-10-CM

## 2013-08-29 ENCOUNTER — Other Ambulatory Visit: Payer: Self-pay | Admitting: Internal Medicine

## 2013-08-29 DIAGNOSIS — F17200 Nicotine dependence, unspecified, uncomplicated: Secondary | ICD-10-CM

## 2013-08-30 ENCOUNTER — Other Ambulatory Visit: Payer: 59

## 2013-08-30 ENCOUNTER — Inpatient Hospital Stay: Admission: RE | Admit: 2013-08-30 | Payer: 59 | Source: Ambulatory Visit

## 2013-09-05 ENCOUNTER — Ambulatory Visit
Admission: RE | Admit: 2013-09-05 | Discharge: 2013-09-05 | Disposition: A | Discharge status: Home / Self Care | Payer: No Typology Code available for payment source | Source: Ambulatory Visit | Attending: Internal Medicine | Admitting: Internal Medicine

## 2013-09-05 DIAGNOSIS — R918 Other nonspecific abnormal finding of lung field: Secondary | ICD-10-CM | POA: Insufficient documentation

## 2013-09-05 DIAGNOSIS — F17200 Nicotine dependence, unspecified, uncomplicated: Secondary | ICD-10-CM

## 2013-09-07 ENCOUNTER — Other Ambulatory Visit (HOSPITAL_COMMUNITY): Payer: Self-pay | Admitting: Internal Medicine

## 2013-09-07 DIAGNOSIS — R918 Other nonspecific abnormal finding of lung field: Secondary | ICD-10-CM

## 2013-09-20 ENCOUNTER — Encounter (HOSPITAL_COMMUNITY): Payer: 59

## 2013-10-02 ENCOUNTER — Encounter (HOSPITAL_COMMUNITY)
Admission: RE | Admit: 2013-10-02 | Discharge: 2013-10-02 | Disposition: A | Discharge status: Home / Self Care | Payer: 59 | Source: Ambulatory Visit | Attending: Internal Medicine | Admitting: Internal Medicine

## 2013-10-02 DIAGNOSIS — I7090 Unspecified atherosclerosis: Secondary | ICD-10-CM | POA: Insufficient documentation

## 2013-10-02 DIAGNOSIS — R918 Other nonspecific abnormal finding of lung field: Secondary | ICD-10-CM | POA: Insufficient documentation

## 2013-10-02 DIAGNOSIS — Z87891 Personal history of nicotine dependence: Secondary | ICD-10-CM | POA: Insufficient documentation

## 2013-10-02 DIAGNOSIS — J438 Other emphysema: Secondary | ICD-10-CM | POA: Insufficient documentation

## 2013-10-02 DIAGNOSIS — J984 Other disorders of lung: Secondary | ICD-10-CM | POA: Insufficient documentation

## 2013-10-02 LAB — GLUCOSE, CAPILLARY: GLUCOSE-CAPILLARY: 168 mg/dL — AB (ref 70–99)

## 2013-10-02 MED ORDER — FLUDEOXYGLUCOSE F - 18 (FDG) INJECTION
9.9000 | Freq: Once | INTRAVENOUS | Status: AC | PRN
  Administered 2013-10-02: 9.9 via INTRAVENOUS

## 2014-01-16 ENCOUNTER — Encounter: Payer: Self-pay | Admitting: Cardiovascular Disease

## 2014-01-16 ENCOUNTER — Ambulatory Visit (INDEPENDENT_AMBULATORY_CARE_PROVIDER_SITE_OTHER): Payer: 59 | Admitting: Cardiovascular Disease

## 2014-01-16 VITALS — BP 120/78 | HR 85 | Ht 72.0 in | Wt 192.8 lb

## 2014-01-16 DIAGNOSIS — E785 Hyperlipidemia, unspecified: Secondary | ICD-10-CM

## 2014-01-16 DIAGNOSIS — I10 Essential (primary) hypertension: Secondary | ICD-10-CM

## 2014-01-16 NOTE — Assessment & Plan Note (Signed)
Stable.  Followed by Dr. Joylene Draft

## 2014-01-16 NOTE — Progress Notes (Signed)
[name] Date of Birth  01-Mar-1946 [facility] [address]     [place], [place]  [address] [phone]  Fax  [phone]  Problem List 1. HTN 2. Hyperlipidemia 3. PVD 4. Diabetes Mellitus 5. AAA repair -  Gae Gallop, MD ( 2003)     History of Present Illness:  Argelio is a 68 year old gentleman with a history of hypertension, hyperlipidemia, and peripheral vascular disease. He's done well since I saw him a year ago. Unfortunately, he continues to smoke. He's not had any episodes of chest pain or shortness of breath.  Playing lots of golf.  He still works - Passenger transport manager.  He still smokes - he's trying the new E-cigarettes.   Nov. 13, 2014:  Not much new.  Still smoiking  January 16, 2014:  Ezana is doing well.  He has stopped smoking and is using an e-cig.  Starting an exercise program.   No CP so far.  Walking on the treadmill - up to 15-20 minutes a day.    Still working.       Current Outpatient Prescriptions on File Prior to Visit  Medication Sig Dispense Refill  . aspirin 81 MG tablet Take 81 mg by mouth daily.        . Cholecalciferol (VITAMIN D PO) Take 1,000 Units by mouth daily.       . Cyanocobalamin (VITAMIN B 12 PO) Take 500 mcg by mouth daily.       Marland Kitchen LIPITOR 80 MG tablet Take 1 tablet by mouth Daily.      Marland Kitchen LOVAZA 1 G capsule Take 1 capsule by mouth 4 times daily.      . metFORMIN (GLUMETZA) 1000 MG (MOD) 24 hr tablet Take 1,000 mg by mouth 2 (two) times daily before lunch and supper.       . NESINA 25 MG TABS Take 1 tablet by mouth daily.      . niacin 500 MG tablet Take 500 mg by mouth 4 (four) times daily.       . ramipril (ALTACE) 5 MG capsule Take 1 capsule (5 mg total) by mouth daily.  30 capsule  11  . ZETIA 10 MG tablet Take 1/2 tablet once a day       No current facility-administered medications on file prior to visit.    No Known Allergies  Past Medical History  Diagnosis Date   . Hypertension   . Peripheral vascular disease   . Erectile dysfunction   . Hyperlipidemia   . Diabetes mellitus   . Emphysema   . Adenomatous colon polyp     Past Surgical History  Procedure Laterality Date  . Abdominal aortic aneurysm repair  2004  . Colonoscopy  multiple since 2004    History  Smoking status  . Current Every Day Smoker -- 1.00 packs/day  Smokeless tobacco  . Never Used    History  Alcohol Use  . Yes    Comment: not even 1 drink a week    Family History  Problem Relation Age of Onset  . Heart failure Mother   . Diabetes Mother   . Cancer Father     lung    Reviw of Systems:  Reviewed in the HPI.  All other systems are negative.  Physical Exam: BP 120/78  Pulse 85  Ht 6' (1.829 m)  Wt 192 lb 12.8 oz (87.454 kg)  BMI 26.14 kg/m2  SpO2 95%  The patient is alert and oriented x 3.  The mood and affect are normal.   Skin: warm and dry.  Color is normal.    HEENT:   the sclera are nonicteric.  The mucous membranes are moist.  The carotids are 2+ without bruits.  There is no thyromegaly.  There is no JVD.    Lungs: clear.  The chest wall is non tender.    Heart: regular rate with a normal S1 and S2.  There are no murmurs, gallops, or rubs. The PMI is not displaced.     Abdomen: good bowel sounds.  There is no guarding or rebound.  There is no hepatosplenomegaly or tenderness.  There are no masses.   Extremities:  no clubbing, cyanosis, or edema.  The legs are without rashes.  The distal pulses are intact.   Neuro:  Cranial nerves II - XII are intact.  Motor and sensory functions are intact.    The gait is normal.  ECG: Nov. 13, 2014:  NSR at 54; LVH  Assessment / Plan:

## 2014-01-16 NOTE — Assessment & Plan Note (Signed)
His BP is well controlled.  He has stopped smoking.

## 2014-01-16 NOTE — Patient Instructions (Signed)
Your physician recommends that you continue on your current medications as directed. Please refer to the Current Medication list given to you today.  Your physician wants you to follow-up in: 1 year with Dr. Nahser.  You will receive a reminder letter in the mail two months in advance. If you don't receive a letter, please call our office to schedule the follow-up appointment.  

## 2014-01-22 ENCOUNTER — Encounter: Payer: Self-pay | Admitting: Pulmonary Disease

## 2014-01-22 ENCOUNTER — Ambulatory Visit (INDEPENDENT_AMBULATORY_CARE_PROVIDER_SITE_OTHER): Payer: 59 | Admitting: Pulmonary Disease

## 2014-01-22 VITALS — BP 120/84 | HR 76 | Temp 98.2°F | Ht 72.0 in | Wt 193.4 lb

## 2014-01-22 DIAGNOSIS — R918 Other nonspecific abnormal finding of lung field: Secondary | ICD-10-CM

## 2014-01-22 NOTE — Assessment & Plan Note (Signed)
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 He has had a recent PET scan with no increased metabolic activity, however nodules less than 10 mm that represent cancer often do not have uptake. I suspect these are either scars versus subpleural lymph nodes, but he will need surveillance per Fleischner criteria.   This calls for a followup CT in approximately 6 months from his previous, and the patient is agreeable to this approach. Apparently, this has RE been set up by his primary physician, and the patient will contact me otherwise. I will be happy to call him once I see the results of his study.  Finally, I have stressed to him the importance of total smoking cessation, and the patient is working on this.

## 2014-01-22 NOTE — Patient Instructions (Signed)
You will need to have a noncontrasted ct chest in august of this year to followup your "spots". Let me know if this is already scheduled, or if we need to schedule Will call you with results once I review the scan. Continue with total smoking cessation.

## 2014-01-22 NOTE — Progress Notes (Signed)
   Subjective:    Patient ID: Geoffrey Wood, male    DOB: 07-25-1946, 68 y.o.   MRN: [id number]  HPI The patient is a 68 year old male who I've been asked to see for management of pulmonary nodules. He has a long history of tobacco abuse, but did quit in February after having a surveillance CT chest. This showed multiple nodules in the right lung up to 9 mm, most of which were subpleural in nature. The patient subsequently had a PET scan which showed no increased metabolic activity. He denies any recent hemoptysis or unexplained weight loss, and his appetite has remained normal. He has never lived in the [place] or [place], and denies any TB exposure in the past. He thinks he has had a negative PPD before. He has never worked in a significant environment with Forensic psychologist.   Review of Systems  Constitutional: Negative for fever and unexpected weight change.  HENT: Positive for dental problem. Negative for congestion, ear pain, nosebleeds, postnasal drip, rhinorrhea, sinus pressure, sneezing, sore throat and trouble swallowing.   Eyes: Negative for redness and itching.  Respiratory: Positive for cough. Negative for chest tightness, shortness of breath and wheezing.   Cardiovascular: Negative for palpitations and leg swelling.  Gastrointestinal: Negative for nausea and vomiting.  Genitourinary: Negative for dysuria.  Musculoskeletal: Negative for joint swelling.  Skin: Negative for rash.  Neurological: Negative for headaches.  Hematological: Does not bruise/bleed easily.  Psychiatric/Behavioral: Negative for dysphoric mood. The patient is not nervous/anxious.        Objective:   Physical Exam Constitutional:  Well developed, no acute distress  HENT:  Nares patent without discharge  Oropharynx without exudate, palate and uvula are normal  Eyes:  Perrla, eomi, no scleral icterus  Neck:  No JVD, no TMG  Cardiovascular:  Normal rate, regular rhythm, no rubs or gallops.  No  murmurs        Intact distal pulses  Pulmonary :  Normal breath sounds, no stridor or respiratory distress   No rales, rhonchi, or wheezing  Abdominal:  Soft, nondistended, bowel sounds present.  No tenderness noted.   Musculoskeletal:  minimal lower extremity edema noted.  Lymph Nodes:  No cervical lymphadenopathy noted  Skin:  No cyanosis noted  Neurologic:  Alert, appropriate, moves all 4 extremities without obvious deficit.         Assessment & Plan:

## 2014-03-06 ENCOUNTER — Other Ambulatory Visit: Payer: Self-pay | Admitting: Internal Medicine

## 2014-03-06 DIAGNOSIS — R911 Solitary pulmonary nodule: Secondary | ICD-10-CM

## 2014-07-09 ENCOUNTER — Ambulatory Visit
Admission: RE | Admit: 2014-07-09 | Discharge: 2014-07-09 | Disposition: A | Discharge status: Home / Self Care | Payer: 59 | Source: Ambulatory Visit | Attending: Internal Medicine | Admitting: Internal Medicine

## 2014-07-09 DIAGNOSIS — R911 Solitary pulmonary nodule: Secondary | ICD-10-CM

## 2014-10-09 ENCOUNTER — Other Ambulatory Visit: Payer: Self-pay | Admitting: Cardiovascular Disease

## 2014-11-01 ENCOUNTER — Ambulatory Visit: Payer: Self-pay | Admitting: Cardiovascular Disease

## 2015-02-07 ENCOUNTER — Encounter: Payer: Self-pay | Admitting: Internal Medicine

## 2015-02-21 ENCOUNTER — Ambulatory Visit: Payer: Self-pay | Admitting: Cardiovascular Disease

## 2015-03-14 DIAGNOSIS — D692 Other nonthrombocytopenic purpura: Secondary | ICD-10-CM | POA: Insufficient documentation

## 2015-04-16 ENCOUNTER — Encounter: Payer: Self-pay | Admitting: Cardiovascular Disease

## 2015-04-16 ENCOUNTER — Ambulatory Visit (INDEPENDENT_AMBULATORY_CARE_PROVIDER_SITE_OTHER): Payer: PRIVATE HEALTH INSURANCE | Admitting: Cardiovascular Disease

## 2015-04-16 VITALS — BP 120/68 | HR 74 | Ht 72.0 in | Wt 195.8 lb

## 2015-04-16 DIAGNOSIS — E785 Hyperlipidemia, unspecified: Secondary | ICD-10-CM

## 2015-04-16 DIAGNOSIS — I10 Essential (primary) hypertension: Secondary | ICD-10-CM

## 2015-04-16 NOTE — Patient Instructions (Signed)
Medication Instructions:  Your physician recommends that you continue on your current medications as directed. Please refer to the Current Medication list given to you today.   Labwork: None Ordered   Testing/Procedures: None Ordered   Follow-Up: Your physician wants you to follow-up in: 1 year with Dr. Nahser.  You will receive a reminder letter in the mail two months in advance. If you don't receive a letter, please call our office to schedule the follow-up appointment.      

## 2015-04-16 NOTE — Progress Notes (Signed)
Geoffrey Wood Date of Birth  Feb 20, 1946 [facility] / [facility] [address]     Geoffrey Wood [place], [place]  [address] [phone]  Fax  [phone]  Problem List 1. HTN 2. Hyperlipidemia 3. PVD 4. Diabetes Mellitus 5. AAA repair -  Geoffrey Gallop, MD ( 2003)     History of Present Illness:  Geoffrey Wood is a 69 year old gentleman with a history of hypertension, hyperlipidemia, and peripheral vascular disease. He's done well since I saw him a year ago. Unfortunately, he continues to smoke. He's not had any episodes of chest pain or shortness of breath.  Playing lots of golf.  He still works - Geoffrey Wood.  He still smokes - he's trying the new E-cigarettes.   Nov. 13, 2014:  Not much new.  Still smoiking  January 16, 2014:  Geoffrey Wood is doing well.  He has stopped smoking and is using an e-cig.  Starting an exercise program.   No CP so far.  Walking on the treadmill - up to 15-20 minutes a day.    Still working.      Aug. 30, 2016  Still not smoking  Lipids followed by Geoffrey Wood No CP, no dyspne. Still works - Engineer, maintenance (IT) . Plays golf 3-4 days a week .    Current Outpatient Prescriptions on File Prior to Visit  Medication Sig Dispense Refill  . aspirin 81 MG tablet Take 81 mg by mouth daily.      . Cholecalciferol (VITAMIN D PO) Take 1,000 Units by mouth daily.     . Cyanocobalamin (VITAMIN B 12 PO) Take 500 mcg by mouth daily.     Marland Kitchen LIPITOR 80 MG tablet Take 1 tablet by mouth Daily.    Marland Kitchen LOVAZA 1 G capsule Take 1 capsule by mouth 4 times daily.    . metFORMIN (GLUMETZA) 1000 MG (MOD) 24 hr tablet Take 1,000 mg by mouth 2 (two) times daily before lunch and supper.     . niacin 500 MG tablet Take 500 mg by mouth 4 (four) times daily.     . ramipril (ALTACE) 5 MG capsule TAKE (1) CAPSULE DAILY. 30 capsule 6   No current facility-administered medications on file prior to visit.    No Known Allergies  Past  Medical History  Diagnosis Date  . Hypertension   . Peripheral vascular disease   . Erectile dysfunction   . Hyperlipidemia   . Diabetes mellitus   . Emphysema   . Adenomatous colon polyp     Past Surgical History  Procedure Laterality Date  . Abdominal aortic aneurysm repair  2004  . Colonoscopy  multiple since 2004    History  Smoking status  . Former Smoker -- 1.00 packs/day for 50 years  . Types: Cigarettes  . Quit date: 09/17/2013  Smokeless tobacco  . Never Used    Comment: pt is using e-cigs daily    History  Alcohol Use  . Yes    Comment: not even 1 drink a week    Family History  Problem Relation Age of Onset  . Heart failure Mother   . Diabetes Mother   . Cancer Father     lung    Reviw of Systems:  Reviewed in the HPI.  All other systems are negative.  Physical Exam: BP 120/68 mmHg  Pulse 74  Ht 6' (1.829 m)  Wt 88.814 kg (195 lb 12.8 oz)  BMI 26.55 kg/m2 The  patient is alert and oriented x 3.  The mood and affect are normal.   Skin: warm and dry.  Color is normal.    HEENT:   the sclera are nonicteric.  The mucous membranes are moist.  The carotids are 2+ without bruits.  There is no thyromegaly.  There is no JVD.    Lungs: clear.  The chest wall is non tender.    Heart: regular rate with a normal S1 and S2.  There are no murmurs, gallops, or rubs. The PMI is not displaced.     Abdomen: good bowel sounds.  There is no guarding or rebound.  There is no hepatosplenomegaly or tenderness.  There are no masses.   Extremities:  no clubbing, cyanosis, or edema.  The legs are without rashes.  The distal pulses are intact.   Neuro:  Cranial nerves II - XII are intact.  Motor and sensory functions are intact.    The gait is normal.  ECG: 04/16/2015:  Normal sinus rhythm at 74.  Assessment / Plan:   1. HTN-  blood pressure is well-controlled. Continue current medications. 2. Hyperlipidemia - his lipids are managed by his primary medical  doctor. Continue current medications. 3. PVD 4. Diabetes Mellitus 5. AAA repair -  Geoffrey Gallop, MD ( 2003)      Geoffrey Wood, Geoffrey Cheng, MD  04/16/2015 7:56 AM    [place] [address],  [place], [place]  [address] Pager [phone] Phone: [phone]; Fax: [phone]   [facility]  [address] [place],   [address] [phone]   Fax [phone]

## 2015-07-17 ENCOUNTER — Other Ambulatory Visit: Payer: Self-pay | Admitting: Internal Medicine

## 2015-07-17 DIAGNOSIS — R911 Solitary pulmonary nodule: Secondary | ICD-10-CM

## 2015-07-18 ENCOUNTER — Other Ambulatory Visit: Payer: Self-pay | Admitting: Internal Medicine

## 2015-07-18 DIAGNOSIS — R911 Solitary pulmonary nodule: Secondary | ICD-10-CM

## 2015-07-26 ENCOUNTER — Ambulatory Visit
Admission: RE | Admit: 2015-07-26 | Discharge: 2015-07-26 | Disposition: A | Discharge status: Home / Self Care | Payer: Managed Care, Other (non HMO) | Source: Ambulatory Visit | Attending: Internal Medicine | Admitting: Internal Medicine

## 2015-07-26 ENCOUNTER — Other Ambulatory Visit: Payer: PRIVATE HEALTH INSURANCE

## 2015-07-26 DIAGNOSIS — R911 Solitary pulmonary nodule: Secondary | ICD-10-CM

## 2015-11-28 ENCOUNTER — Other Ambulatory Visit: Payer: Self-pay | Admitting: Cardiovascular Disease

## 2016-04-02 ENCOUNTER — Encounter: Payer: Self-pay | Admitting: Cardiovascular Disease

## 2016-04-10 DIAGNOSIS — E1121 Type 2 diabetes mellitus with diabetic nephropathy: Secondary | ICD-10-CM | POA: Insufficient documentation

## 2016-04-10 DIAGNOSIS — R809 Proteinuria, unspecified: Secondary | ICD-10-CM | POA: Insufficient documentation

## 2016-04-16 ENCOUNTER — Ambulatory Visit (INDEPENDENT_AMBULATORY_CARE_PROVIDER_SITE_OTHER): Payer: Managed Care, Other (non HMO) | Admitting: Cardiovascular Disease

## 2016-04-16 ENCOUNTER — Encounter: Payer: Self-pay | Admitting: Cardiovascular Disease

## 2016-04-16 VITALS — BP 134/84 | HR 78 | Ht 72.0 in | Wt 198.0 lb

## 2016-04-16 DIAGNOSIS — Z9889 Other specified postprocedural states: Secondary | ICD-10-CM

## 2016-04-16 DIAGNOSIS — E785 Hyperlipidemia, unspecified: Secondary | ICD-10-CM

## 2016-04-16 DIAGNOSIS — I10 Essential (primary) hypertension: Secondary | ICD-10-CM

## 2016-04-16 DIAGNOSIS — Z8679 Personal history of other diseases of the circulatory system: Secondary | ICD-10-CM

## 2016-04-16 NOTE — Progress Notes (Signed)
Geoffrey Wood Date of Birth  08-Nov-1945 [facility] / [facility] [id number]     [place], [place]  [address] [phone]  Fax  [phone]  Problem List 1. HTN 2. Hyperlipidemia 3. PVD 4. Diabetes Mellitus 5. AAA repair -  Gae Gallop, MD ( 2003)     History of Present Illness:  Geoffrey Wood is a 70 year old gentleman with a history of hypertension, hyperlipidemia, and peripheral vascular disease. He's done well since I saw him a year ago. Unfortunately, he continues to smoke. He's not had any episodes of chest pain or shortness of breath.  Playing lots of golf.  He still works - Passenger transport manager.  He still smokes - he's trying the new E-cigarettes.   Nov. 13, 2014:  Not much new.  Still smoiking  January 16, 2014:  Geoffrey Wood is doing well.  He has stopped smoking and is using an e-cig.  Starting an exercise program.   No CP so far.  Walking on the treadmill - up to 15-20 minutes a day.    Still working.      Aug. 30, 2016  Still not smoking  Lipids followed by Perini No CP, no dyspnea. Still works - Engineer, maintenance (IT) . Plays golf 3-4 days a week .    Aug. 31, 2017: Geoffrey Wood is seen today for follow up of his HTN and hyperlipidemia Plays golf at the [employer] and [place]  Some shortness of breath walking up hills, no CP    Current Outpatient Prescriptions on File Prior to Visit  Medication Sig Dispense Refill  . Alogliptin Benzoate (NESINA) 25 MG TABS Take 25 mg by mouth daily.    Marland Kitchen aspirin 81 MG tablet Take 81 mg by mouth daily.      . Cholecalciferol (VITAMIN D PO) Take 1,000 Units by mouth daily.     . Cyanocobalamin (VITAMIN B 12 PO) Take 500 mcg by mouth daily.     Marland Kitchen ezetimibe (ZETIA) 10 MG tablet Take 5 mg by mouth daily. Pt takes 1/2 tablet by mouth daily    . LIPITOR 80 MG tablet Take 1 tablet by mouth Daily.    Marland Kitchen LOVAZA 1 G capsule Take 1 capsule by mouth 4 times daily.    . metFORMIN (GLUMETZA)  1000 MG (MOD) 24 hr tablet Take 1,000 mg by mouth 2 (two) times daily before lunch and supper.     . niacin 500 MG tablet Take 500 mg by mouth 4 (four) times daily.     . ramipril (ALTACE) 5 MG capsule TAKE (1) CAPSULE DAILY. 30 capsule 6   No current facility-administered medications on file prior to visit.     No Known Allergies  Past Medical History:  Diagnosis Date  . Adenomatous colon polyp   . Diabetes mellitus   . Emphysema   . Erectile dysfunction   . Hyperlipidemia   . Hypertension   . Peripheral vascular disease [facility])     Past Surgical History:  Procedure Laterality Date  . ABDOMINAL AORTIC ANEURYSM REPAIR  2004  . COLONOSCOPY  multiple since 2004    History  Smoking Status  . Former Smoker  . Packs/day: 1.00  . Years: 50.00  . Types: Cigarettes  . Quit date: 09/17/2013  Smokeless Tobacco  . Never Used    Comment: pt is using e-cigs daily    History  Alcohol Use  . Yes    Comment: not even 1 drink  a week    Family History  Problem Relation Age of Onset  . Heart failure Mother   . Diabetes Mother   . Cancer Father     lung    Reviw of Systems:  Reviewed in the HPI.  All other systems are negative.  Physical Exam: BP 134/84   Pulse 78   Ht 6' (1.829 m)   Wt 198 lb (89.8 kg)   BMI 26.85 kg/m  The patient is alert and oriented x 3.  The mood and affect are normal.   Skin: warm and dry.  Color is normal.    HEENT:   the sclera are nonicteric.  The mucous membranes are moist.  The carotids are 2+ without bruits.  There is no thyromegaly.  There is no JVD.    Lungs: clear.  The chest wall is non tender.    Heart: regular rate with a normal S1 and S2.  There are no murmurs, gallops, or rubs. The PMI is not displaced.     Abdomen: good bowel sounds.  There is no guarding or rebound.  There is no hepatosplenomegaly or tenderness.  There are no masses.   Extremities:  no clubbing, cyanosis, or edema.  The legs are without rashes.  The distal  pulses are intact.   Neuro:  Cranial nerves II - XII are intact.  Motor and sensory functions are intact.    The gait is normal.  ECG: Aug. 31, 2017:   NSR at 79.   Normal ECG  Assessment / Plan:   1. HTN-  blood pressure is well-controlled. Continue current medications. 2. Hyperlipidemia - his lipids are managed by his primary medical doctor. Continue current medications. 3. PVD 4. Diabetes Mellitus 5. AAA repair -  Gae Gallop, MD ( 2003)      Mertie Moores, MD  04/16/2016 8:15 AM    [address] [address],  [place], [place]  [address] Pager [phone] Phone: [phone]; Fax: [phone]   [facility]  [address] [place], [place]  [address] [phone]   Fax [phone]

## 2016-04-16 NOTE — Patient Instructions (Signed)
Medication Instructions:  Your physician recommends that you continue on your current medications as directed. Please refer to the Current Medication list given to you today.   Labwork: None Ordered   Testing/Procedures: None Ordered   Follow-Up: Your physician wants you to follow-up in: 1 year with Dr. Nahser.  You will receive a reminder letter in the mail two months in advance. If you don't receive a letter, please call our office to schedule the follow-up appointment.   If you need a refill on your cardiac medications before your next appointment, please call your pharmacy.   Thank you for choosing [facility]! Molli Gethers, RN [phone]    

## 2016-07-15 ENCOUNTER — Other Ambulatory Visit: Payer: Self-pay | Admitting: Internal Medicine

## 2016-07-15 DIAGNOSIS — R918 Other nonspecific abnormal finding of lung field: Secondary | ICD-10-CM

## 2016-07-23 ENCOUNTER — Other Ambulatory Visit: Payer: Managed Care, Other (non HMO)

## 2016-07-27 ENCOUNTER — Ambulatory Visit
Admission: RE | Admit: 2016-07-27 | Discharge: 2016-07-27 | Disposition: A | Discharge status: Home / Self Care | Payer: Managed Care, Other (non HMO) | Source: Ambulatory Visit | Attending: Internal Medicine | Admitting: Internal Medicine

## 2016-07-27 DIAGNOSIS — R918 Other nonspecific abnormal finding of lung field: Secondary | ICD-10-CM

## 2016-07-28 DIAGNOSIS — I77819 Aortic ectasia, unspecified site: Secondary | ICD-10-CM | POA: Insufficient documentation

## 2016-12-04 DIAGNOSIS — R079 Chest pain, unspecified: Secondary | ICD-10-CM | POA: Insufficient documentation

## 2016-12-16 ENCOUNTER — Other Ambulatory Visit: Payer: Self-pay | Admitting: Cardiovascular Disease

## 2017-04-21 ENCOUNTER — Other Ambulatory Visit: Payer: Self-pay | Admitting: Cardiovascular Disease

## 2017-05-04 ENCOUNTER — Encounter: Payer: Self-pay | Admitting: Internal Medicine

## 2017-06-29 ENCOUNTER — Other Ambulatory Visit: Payer: Self-pay | Admitting: Cardiovascular Disease

## 2017-08-23 ENCOUNTER — Encounter: Payer: Self-pay | Admitting: Cardiovascular Disease

## 2017-08-23 ENCOUNTER — Ambulatory Visit (INDEPENDENT_AMBULATORY_CARE_PROVIDER_SITE_OTHER): Payer: Managed Care, Other (non HMO) | Admitting: Cardiovascular Disease

## 2017-08-23 VITALS — BP 114/72 | HR 68 | Ht 72.0 in | Wt 185.8 lb

## 2017-08-23 DIAGNOSIS — E782 Mixed hyperlipidemia: Secondary | ICD-10-CM

## 2017-08-23 DIAGNOSIS — I10 Essential (primary) hypertension: Secondary | ICD-10-CM

## 2017-08-23 DIAGNOSIS — I119 Hypertensive heart disease without heart failure: Secondary | ICD-10-CM

## 2017-08-23 MED ORDER — RAMIPRIL 5 MG PO CAPS: 5.0000 mg | ORAL_CAPSULE | Freq: Every day | ORAL | 3 refills | Status: DC

## 2017-08-23 NOTE — Patient Instructions (Signed)
Medication Instructions:  Your physician recommends that you continue on your current medications as directed. Please refer to the Current Medication list given to you today.   Labwork: None Ordered   Testing/Procedures: Your physician has requested that you have an echocardiogram. Echocardiography is a painless test that uses sound waves to create images of your heart. It provides your doctor with information about the size and shape of your heart and how well your heart's chambers and valves are working. This procedure takes approximately one hour. There are no restrictions for this procedure.   Follow-Up: Your physician wants you to follow-up in: 1 year with Dr. Nahser. You will receive a reminder letter in the mail two months in advance. If you don't receive a letter, please call our office to schedule the follow-up appointment.   If you need a refill on your cardiac medications before your next appointment, please call your pharmacy.   Thank you for choosing [facility]! Michelle Swinyer, RN [phone]    

## 2017-08-23 NOTE — Progress Notes (Signed)
Geoffrey Wood Date of Birth  10-12-1945 [facility] / [facility] [address]     [place], [place]  [address] [phone]  Fax  [phone]  Problem List 1. HTN 2. Hyperlipidemia 3. PVD 4. Diabetes Mellitus 5. AAA repair -  Gae Gallop, MD ( 2003)     History of Present Illness:  Geoffrey Wood is a 72 year old gentleman with a history of hypertension, hyperlipidemia, and peripheral vascular disease. He's done well since I saw him a year ago. Unfortunately, he continues to smoke. He's not had any episodes of chest pain or shortness of breath.  Playing lots of golf.  He still works - Passenger transport manager.  He still smokes - he's trying the new E-cigarettes.   Nov. 13, 2014:  Not much new.  Still smoiking  January 16, 2014:  Geoffrey Wood is doing well.  He has stopped smoking and is using an e-cig.  Starting an exercise program.   No CP so far.  Walking on the treadmill - up to 15-20 minutes a day.    Still working.      Aug. 30, 2016  Still not smoking  Lipids followed by Perini No CP, no dyspnea. Still works - Engineer, maintenance (IT) . Plays golf 3-4 days a week .    Aug. 31, 2017: Geoffrey Wood is seen today for follow up of his HTN and hyperlipidemia Plays golf at the [place] and [place]  Some shortness of breath walking up hills, no CP   Jan. 7, 2019  Doing well Still smoking - wants to quit -    Current Outpatient Medications on File Prior to Visit  Medication Sig Dispense Refill  . Alogliptin Benzoate (NESINA) 25 MG TABS Take 25 mg by mouth daily.    [name] 62.5-25 MCG/INH AEPB Take 1 puff by mouth daily.    Marland Kitchen aspirin 81 MG tablet Take 81 mg by mouth daily.      . Cholecalciferol (VITAMIN D PO) Take 1,000 Units by mouth daily.     . Cyanocobalamin (VITAMIN B 12 PO) Take 500 mcg by mouth daily.     Marland Kitchen ezetimibe (ZETIA) 10 MG tablet Take 5 mg by mouth daily. Pt takes 1/2 tablet by mouth daily    . LIPITOR 80  MG tablet Take 1 tablet by mouth Daily.    Marland Kitchen LOVAZA 1 G capsule Take 1 capsule by mouth 4 times daily.    . metFORMIN (GLUMETZA) 1000 MG (MOD) 24 hr tablet Take 1,000 mg by mouth 2 (two) times daily before lunch and supper.     . niacin 500 MG tablet Take 500 mg by mouth 4 (four) times daily.     Marland Kitchen omeprazole (PRILOSEC) 20 MG capsule Take 1 capsule by mouth daily.    . ramipril (ALTACE) 5 MG capsule Take 1 capsule (5 mg total) daily by mouth. Please keep 08/23/17 appointment for further refills 30 capsule 1  . SYNJARDY 12.12-998 MG TABS Take 1 tablet by mouth 2 (two) times daily.     No current facility-administered medications on file prior to visit.     No Known Allergies  Past Medical History:  Diagnosis Date  . Adenomatous colon polyp   . Diabetes mellitus   . Emphysema   . Erectile dysfunction   . Hyperlipidemia   . Hypertension   . Peripheral vascular disease [facility])     Past Surgical History:  Procedure Laterality Date  . ABDOMINAL  AORTIC ANEURYSM REPAIR  2004  . COLONOSCOPY  multiple since 2004    Social History   Tobacco Use  Smoking Status Former Smoker  . Packs/day: 1.00  . Years: 50.00  . Pack years: 50.00  . Types: Cigarettes  . Last attempt to quit: 09/17/2013  . Years since quitting: 3.9  Smokeless Tobacco Never Used  Tobacco Comment   pt is using e-cigs daily    Social History   Substance and Sexual Activity  Alcohol Use Yes   Comment: not even 1 drink a week    Family History  Problem Relation Age of Onset  . Heart failure Mother   . Diabetes Mother   . Cancer Father        lung    Reviw of Systems:  Reviewed in the HPI.  All other systems are negative.  Physical Exam: Blood pressure 114/72, pulse 68, height 6' (1.829 m), weight 185 lb 12.8 oz (84.3 kg), SpO2 98 %.  GEN:  Well nourished, well developed in no acute distress HEENT: Normal NECK: No JVD; No carotid bruits LYMPHATICS: No lymphadenopathy CARDIAC: RR , no murmurs, rubs,  gallops RESPIRATORY:  Clear to auscultation without rales, wheezing or rhonchi  ABDOMEN: Soft, non-tender, non-distended.  Midline scar  MUSCULOSKELETAL:  No edema;  , + clubbing of fingers.   SKIN: Warm and dry NEUROLOGIC:  Alert and oriented x 3 .  ECG: August 23, 2017: Normal sinus rhythm at 60.  Normal EKG.  Assessment / Plan:   1. HTN-   -his blood pressure is well controlled.  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 Hyperlipidemia -    .  His lipid levels are followed by his primary medical doctor Perini .  Continue current medications.  3. PVD -status post abdominal aortic aneurysm repair in 2003.  4. Diabetes Mellitus 5. AAA repair -  Gae Gallop, MD ( 2003)   6.  Clubbing of fingers - will get an echo to eval pressures in his  RV .    Mertie Moores, MD  08/23/2017 8:58 AM    [place] [address],  [place], [place]  [address] Pager [phone] Phone: [phone]; Fax: [phone]

## 2017-08-26 ENCOUNTER — Other Ambulatory Visit: Payer: Self-pay

## 2017-08-26 ENCOUNTER — Ambulatory Visit (HOSPITAL_COMMUNITY): Payer: Managed Care, Other (non HMO) | Attending: Cardiology

## 2017-08-26 DIAGNOSIS — I119 Hypertensive heart disease without heart failure: Secondary | ICD-10-CM | POA: Diagnosis not present

## 2017-08-27 ENCOUNTER — Telehealth: Payer: Self-pay | Admitting: Cardiovascular Disease

## 2017-08-27 NOTE — Telephone Encounter (Signed)
Reviewed results of echo with patient who verbalized understanding. He thanked me for the call.

## 2017-08-27 NOTE — Telephone Encounter (Signed)
Follow Up:    Returning your call from today,concerning his Echo results.i.

## 2017-08-28 ENCOUNTER — Other Ambulatory Visit: Payer: Self-pay | Admitting: Internal Medicine

## 2017-08-28 DIAGNOSIS — I77810 Thoracic aortic ectasia: Secondary | ICD-10-CM

## 2017-08-30 ENCOUNTER — Other Ambulatory Visit: Payer: Self-pay | Admitting: Internal Medicine

## 2017-08-30 DIAGNOSIS — I77810 Thoracic aortic ectasia: Secondary | ICD-10-CM

## 2017-09-02 ENCOUNTER — Encounter: Payer: Self-pay | Admitting: Internal Medicine

## 2017-09-06 ENCOUNTER — Ambulatory Visit
Admission: RE | Admit: 2017-09-06 | Discharge: 2017-09-06 | Disposition: A | Discharge status: Home / Self Care | Payer: Managed Care, Other (non HMO) | Source: Ambulatory Visit | Attending: Internal Medicine | Admitting: Internal Medicine

## 2017-09-06 DIAGNOSIS — I77810 Thoracic aortic ectasia: Secondary | ICD-10-CM

## 2017-09-06 MED ORDER — IOPAMIDOL (ISOVUE-370) INJECTION 76%
75.0000 mL | Freq: Once | INTRAVENOUS | Status: AC | PRN
  Administered 2017-09-06: 75 mL via INTRAVENOUS

## 2017-10-26 ENCOUNTER — Ambulatory Visit (AMBULATORY_SURGERY_CENTER): Payer: Self-pay

## 2017-10-26 ENCOUNTER — Other Ambulatory Visit: Payer: Self-pay

## 2017-10-26 VITALS — Ht 72.0 in | Wt 187.2 lb

## 2017-10-26 DIAGNOSIS — Z86010 Personal history of colonic polyps: Secondary | ICD-10-CM

## 2017-10-26 NOTE — Progress Notes (Signed)
Denies allergies to eggs or soy products. Denies complication of anesthesia or sedation. Denies use of weight loss medication. Denies use of O2.   Emmi instructions declined.  

## 2017-10-27 ENCOUNTER — Encounter: Payer: Self-pay | Admitting: Internal Medicine

## 2017-11-09 ENCOUNTER — Ambulatory Visit (AMBULATORY_SURGERY_CENTER): Payer: Managed Care, Other (non HMO) | Admitting: Internal Medicine

## 2017-11-09 ENCOUNTER — Encounter: Payer: Self-pay | Admitting: Internal Medicine

## 2017-11-09 ENCOUNTER — Other Ambulatory Visit: Payer: Self-pay

## 2017-11-09 VITALS — BP 124/73 | HR 58 | Temp 97.8°F | Resp 14 | Ht 72.0 in | Wt 187.0 lb

## 2017-11-09 DIAGNOSIS — Z86010 Personal history of colonic polyps: Secondary | ICD-10-CM | POA: Diagnosis present

## 2017-11-09 MED ORDER — SODIUM CHLORIDE 0.9 % IV SOLN
500.0000 mL | Freq: Once | INTRAVENOUS | Status: DC
Start: 2017-11-09 — End: 2020-12-04

## 2017-11-09 NOTE — Patient Instructions (Addendum)
No polyps today but given your history of polyps you should be considered for a repeat colonoscopy in 5 years - 2024.  I appreciate the opportunity to care for you. Gatha Mayer, MD, FACG      YOU HAD AN ENDOSCOPIC PROCEDURE TODAY AT [facility]:   Refer to the procedure report that was given to you for any specific questions about what was found during the examination.  If the procedure report does not answer your questions, please call your gastroenterologist to clarify.  If you requested that your care partner not be given the details of your procedure findings, then the procedure report has been included in a sealed envelope for you to review at your convenience later.  YOU SHOULD EXPECT: Some feelings of bloating in the abdomen. Passage of more gas than usual.  Walking can help get rid of the air that was put into your GI tract during the procedure and reduce the bloating. If you had a lower endoscopy (such as a colonoscopy or flexible sigmoidoscopy) you may notice spotting of blood in your stool or on the toilet paper. If you underwent a bowel prep for your procedure, you may not have a normal bowel movement for a few days.  Please Note:  You might notice some irritation and congestion in your nose or some drainage.  This is from the oxygen used during your procedure.  There is no need for concern and it should clear up in a day or so.  SYMPTOMS TO REPORT IMMEDIATELY:   Following lower endoscopy (colonoscopy or flexible sigmoidoscopy):  Excessive amounts of blood in the stool  Significant tenderness or worsening of abdominal pains  Swelling of the abdomen that is new, acute  Fever of 100F or higher    For urgent or emergent issues, a gastroenterologist can be reached at any hour by calling [phone].   DIET:  We do recommend a small meal at first, but then you may proceed to your regular diet.  Drink plenty of fluids but you should avoid alcoholic  beverages for 24 hours.  ACTIVITY:  You should plan to take it easy for the rest of today and you should NOT DRIVE or use heavy machinery until tomorrow (because of the sedation medicines used during the test).    FOLLOW UP: Our staff will call the number listed on your records the next business day following your procedure to check on you and address any questions or concerns that you may have regarding the information given to you following your procedure. If we do not reach you, we will leave a message.  However, if you are feeling well and you are not experiencing any problems, there is no need to return our call.  We will assume that you have returned to your regular daily activities without incident.  If any biopsies were taken you will be contacted by phone or by letter within the next 1-3 weeks.  Please call us at [phone] if you have not heard about the biopsies in 3 weeks.    SIGNATURES/CONFIDENTIALITY: You and/or your care partner have signed paperwork which will be entered into your electronic medical record.  These signatures attest to the fact that that the information above on your After Visit Summary has been reviewed and is understood.  Full responsibility of the confidentiality of this discharge information lies with you and/or your care-partner.    Handout was given to your care partner on Diverticulosis. Your  blood sugar was 158 in the recovery room. You may resume your current medications today. Repeat surveillance colonoscopy in 5 years. Please call if any questions or concerns.

## 2017-11-09 NOTE — Progress Notes (Signed)
No problems noted in the recovery room. maw 

## 2017-11-09 NOTE — Progress Notes (Signed)
Pt's states no medical or surgical changes since previsit or office visit. 

## 2017-11-09 NOTE — Progress Notes (Signed)
Spontaneous respirations throughout. VSS. Resting comfortably. To PACU on room air. Report to  RN. 

## 2017-11-09 NOTE — Op Note (Signed)
[facility] Patient Name: Geoffrey Wood Procedure Date: 11/09/2017 9:37 AM MRN: [id number] Endoscopist: Gatha Mayer , MD Age: 72 Referring MD:  Date of Birth: 1946-03-11 Gender: Male Account #: [id number] Procedure:                Colonoscopy Indications:              Surveillance: Personal history of adenomatous                            polyps on last colonoscopy > 5 years ago Medicines:                Propofol per Anesthesia, Monitored Anesthesia Care Procedure:                Pre-Anesthesia Assessment:                           - Prior to the procedure, a History and Physical                            was performed, and patient medications and                            allergies were reviewed. The patient's tolerance of                            previous anesthesia was also reviewed. The risks                            and benefits of the procedure and the sedation                            options and risks were discussed with the patient.                            All questions were answered, and informed consent                            was obtained. Prior Anticoagulants: The patient has                            taken no previous anticoagulant or antiplatelet                            agents. ASA Grade Assessment: III - A patient with                            severe systemic disease. After reviewing the risks                            and benefits, the patient was deemed in                            satisfactory condition to undergo the procedure.  After obtaining informed consent, the colonoscope                            was passed under direct vision. Throughout the                            procedure, the patient's blood pressure, pulse, and                            oxygen saturations were monitored continuously. The                            Colonoscope was introduced through the anus and   advanced to the the cecum, identified by                            appendiceal orifice and ileocecal valve. The                            colonoscopy was performed without difficulty. The                            patient tolerated the procedure well. The quality                            of the bowel preparation was good. The bowel                            preparation used was Miralax. The ileocecal valve,                            appendiceal orifice, and rectum were photographed. Scope In: 9:45:13 AM Scope Out: 9:56:56 AM Scope Withdrawal Time: 0 hours 9 minutes 24 seconds  Total Procedure Duration: 0 hours 11 minutes 43 seconds  Findings:                 The perianal and digital rectal examinations were                            normal. Pertinent negatives include normal prostate                            (size, shape, and consistency).                           Multiple diverticula were found in the sigmoid                            colon.                           The exam was otherwise without abnormality on                            direct and retroflexion views. Complications:  No immediate complications. Estimated Blood Loss:     Estimated blood loss: none. Impression:               - Diverticulosis in the sigmoid colon.                           - The examination was otherwise normal on direct                            and retroflexion views.                           - No specimens collected. Recommendation:           - Patient has a contact number available for                            emergencies. The signs and symptoms of potential                            delayed complications were discussed with the                            patient. Return to normal activities tomorrow.                            Written discharge instructions were provided to the                            patient.                           - Resume previous diet.                            - Continue present medications.                           - Repeat colonoscopy in 5 years for surveillance.                            does not need annual hemocccults in between Gatha Mayer, MD 11/09/2017 10:01:24 AM This report has been signed electronically.

## 2017-11-10 ENCOUNTER — Telehealth: Payer: Self-pay | Admitting: *Deleted

## 2017-11-10 NOTE — Telephone Encounter (Signed)
  Follow up Call-  Call back number 11/09/2017  Post procedure Call Back phone  # [phone] ext [phone]  Permission to leave phone message Yes  Some recent data might be hidden     Patient questions:  Do you have a fever, pain , or abdominal swelling? No. Pain Score  0 *  Have you tolerated food without any problems? Yes.    Have you been able to return to your normal activities? Yes.    Do you have any questions about your discharge instructions: Diet   No. Medications  No. Follow up visit  No.  Do you have questions or concerns about your Care? No.  Actions: * If pain score is 4 or above: No action needed, pain <4.

## 2017-11-29 ENCOUNTER — Other Ambulatory Visit (HOSPITAL_COMMUNITY): Payer: Self-pay | Admitting: Internal Medicine

## 2017-11-29 DIAGNOSIS — R911 Solitary pulmonary nodule: Secondary | ICD-10-CM

## 2017-12-07 ENCOUNTER — Encounter (HOSPITAL_COMMUNITY): Payer: Managed Care, Other (non HMO)

## 2017-12-13 ENCOUNTER — Encounter (HOSPITAL_COMMUNITY)
Admission: RE | Admit: 2017-12-13 | Discharge: 2017-12-13 | Disposition: A | Discharge status: Home / Self Care | Payer: Managed Care, Other (non HMO) | Source: Ambulatory Visit | Attending: Internal Medicine | Admitting: Internal Medicine

## 2017-12-13 DIAGNOSIS — R911 Solitary pulmonary nodule: Secondary | ICD-10-CM | POA: Diagnosis present

## 2017-12-13 LAB — GLUCOSE, CAPILLARY
Glucose-Capillary: 193 mg/dL — ABNORMAL HIGH (ref 65–99)
Glucose-Capillary: 172 mg/dL — ABNORMAL HIGH (ref 65–99)

## 2017-12-13 MED ORDER — FLUDEOXYGLUCOSE F - 18 (FDG) INJECTION
9.2800 | Freq: Once | INTRAVENOUS | Status: AC | PRN
  Administered 2017-12-13: 9.28 via INTRAVENOUS

## 2018-03-22 DIAGNOSIS — IMO0001 Reserved for inherently not codable concepts without codable children: Secondary | ICD-10-CM | POA: Insufficient documentation

## 2018-06-30 ENCOUNTER — Other Ambulatory Visit: Payer: Self-pay | Admitting: Internal Medicine

## 2018-06-30 DIAGNOSIS — R911 Solitary pulmonary nodule: Secondary | ICD-10-CM

## 2018-09-20 ENCOUNTER — Ambulatory Visit
Admission: RE | Admit: 2018-09-20 | Discharge: 2018-09-20 | Disposition: A | Discharge status: Home / Self Care | Payer: Managed Care, Other (non HMO) | Source: Ambulatory Visit | Attending: Internal Medicine | Admitting: Internal Medicine

## 2018-09-20 DIAGNOSIS — R911 Solitary pulmonary nodule: Secondary | ICD-10-CM

## 2018-09-20 MED ORDER — IOPAMIDOL (ISOVUE-300) INJECTION 61%
75.0000 mL | Freq: Once | INTRAVENOUS | Status: AC | PRN
  Administered 2018-09-20: 75 mL via INTRAVENOUS

## 2018-09-22 ENCOUNTER — Encounter: Payer: Self-pay | Admitting: Cardiovascular Disease

## 2018-09-22 NOTE — Telephone Encounter (Signed)
This encounter was created in error - please disregard.

## 2018-09-22 NOTE — Telephone Encounter (Signed)
New message   Patient's wife is wanting to see if patient can get a prescription for anxiety. Please call to discuss.

## 2018-09-22 NOTE — Telephone Encounter (Signed)
Follow up   Please disregard previous message in error

## 2018-11-10 ENCOUNTER — Telehealth: Payer: Self-pay

## 2018-11-10 NOTE — Telephone Encounter (Signed)
Pt would like to have a virtual visit for his appt.

## 2018-11-14 ENCOUNTER — Telehealth: Payer: Self-pay | Admitting: Cardiovascular Disease

## 2018-11-14 NOTE — Telephone Encounter (Signed)
Spoke with patient who said that he knows how to [place] webex; that he has used it before.  Confirmed all demographics, e-mail and My Chart activation.

## 2018-11-14 NOTE — Telephone Encounter (Signed)
YOUR CARDIOLOGY TEAM HAS ARRANGED FOR AN E-VISIT FOR YOUR APPOINTMENT - PLEASE REVIEW IMPORTANT INFORMATION BELOW SEVERAL DAYS PRIOR TO YOUR APPOINTMENT  Due to the recent COVID-19 pandemic, we are transitioning in-person office visits to tele-medicine visits in an effort to decrease unnecessary exposure to our patients and staff. Medicare and most insurances are covering these visits without a copay needed. You will need a working email and a smartphone or computer with a camera and microphone. For patients that do not have these items, we can still complete the visit using a telephone but do prefer video when possible. If possible, we also ask that you have a blood pressure cuff and scale at home to measure your blood pressure, heart rate and weight prior to your scheduled appointment. Patients with clinical needs that need an in-person evaluation and testing will still be able to come to the office if absolutely necessary. If you have any questions, feel free to call our office.     DOWNLOADING THE SOFTWARE  Download the [employer] WebEx app to enable video and telephone visits with your [facility] Provider.   Instructions for downloading [employer] WebEx: - Go to https://www.webex.com/downloads.html and follow the instructions, or download the app on your smartphone ([employer] Webex Meetings). - If you have technical difficulties with downloading WebEx, please call WebEx at [phone]. - Once the app is downloaded (can be done on either mobile or desktop computer), go to Settings in the upper left hand corner.  Be sure that camera and audio are enabled.  - You will receive an email message with a link to the meeting with a time to join for your tele-health visit.  - Please download the app and have settings configured prior to the appointment time.      2-3 DAYS BEFORE YOUR APPOINTMENT  One of our staff will call you to confirm that you have been able to set up your WebEx account. We will remind  you check your blood pressure, heart rate and weight prior to your scheduled appointment. If you have an Apple Watch or Kardia, please upload any pertinent ECG strips the day before or morning of your appointment to MyChart. Our staff will also make sure you have reviewed the consent and agree to move forward with your scheduled tele-health visit.    THE DAY OF YOUR APPOINTMENT  Approximately 15-20 minutes prior to your scheduled appointment, you will receive an e-mail directly from one of our staff member's [email or web address] e-mail accounts inviting you to join a WebEx meeting.  Please do not reply to that email - simply join the WebEx meeting.  Upon joining, a member of the office staff will speak with you initially through the WebEx platform to confirm medications, vital signs for the day and any symptoms you may be experiencing.  Please have this information available prior to the time of visit start.      CONSENT FOR TELE-HEALTH VISIT - PLEASE RVIEW  I hereby voluntarily request, consent and authorize [facility] and its employed or contracted physicians, physician assistants, nurse practitioners or other licensed health care professionals (the Practitioner), to provide me with telemedicine health care services (the "Services") as deemed necessary by the treating Practitioner. I acknowledge and consent to receive the Services by the Practitioner via telemedicine. I understand that the telemedicine visit will involve communicating with the Practitioner through live audiovisual communication technology and the disclosure of certain medical information by electronic transmission. I acknowledge that I have been given the opportunity to   request an in-person assessment or other available alternative prior to the telemedicine visit and am voluntarily participating in the telemedicine visit.  I understand that I have the right to withhold or withdraw my consent to the use of telemedicine in the course of  my care at any time, without affecting my right to future care or treatment, and that the Practitioner or I may terminate the telemedicine visit at any time. I understand that I have the right to inspect all information obtained and/or recorded in the course of the telemedicine visit and may receive copies of available information for a reasonable fee.  I understand that some of the potential risks of receiving the Services via telemedicine include:  . Delay or interruption in medical evaluation due to technological equipment failure or disruption; . Information transmitted may not be sufficient (e.g. poor resolution of images) to allow for appropriate medical decision making by the Practitioner; and/or  . In rare instances, security protocols could fail, causing a breach of personal health information.  Furthermore, I acknowledge that it is my responsibility to provide information about my medical history, conditions and care that is complete and accurate to the best of my ability. I acknowledge that Practitioner's advice, recommendations, and/or decision may be based on factors not within their control, such as incomplete or inaccurate data provided by me or distortions of diagnostic images or specimens that may result from electronic transmissions. I understand that the practice of medicine is not an exact science and that Practitioner makes no warranties or guarantees regarding treatment outcomes. I acknowledge that I will receive a copy of this consent concurrently upon execution via email to the email address I last provided but may also request a printed copy by calling the office of [facility].    I understand that my insurance will be billed for this visit.   I have read or had this consent read to me. . I understand the contents of this consent, which adequately explains the benefits and risks of the Services being provided via telemedicine.  . I have been provided ample opportunity to ask  questions regarding this consent and the Services and have had my questions answered to my satisfaction. . I give my informed consent for the services to be provided through the use of telemedicine in my medical care  By participating in this telemedicine visit I agree to the above.  

## 2018-11-14 NOTE — Telephone Encounter (Signed)
Pt has given consent to have a Virtual visit with Dr. Acie Fredrickson on 11/16/2018 at 4pm. Pt has been advised to have his BP, HR, and weight ready for appt.

## 2018-11-16 ENCOUNTER — Other Ambulatory Visit: Payer: Self-pay

## 2018-11-16 ENCOUNTER — Encounter: Payer: Self-pay | Admitting: Cardiovascular Disease

## 2018-11-16 ENCOUNTER — Telehealth (INDEPENDENT_AMBULATORY_CARE_PROVIDER_SITE_OTHER): Payer: Managed Care, Other (non HMO) | Admitting: Cardiovascular Disease

## 2018-11-16 VITALS — BP 127/76 | HR 74 | Ht 72.0 in | Wt 174.2 lb

## 2018-11-16 DIAGNOSIS — I2510 Atherosclerotic heart disease of native coronary artery without angina pectoris: Secondary | ICD-10-CM | POA: Diagnosis not present

## 2018-11-16 DIAGNOSIS — I2584 Coronary atherosclerosis due to calcified coronary lesion: Secondary | ICD-10-CM | POA: Diagnosis not present

## 2018-11-16 DIAGNOSIS — E782 Mixed hyperlipidemia: Secondary | ICD-10-CM | POA: Diagnosis not present

## 2018-11-16 DIAGNOSIS — I10 Essential (primary) hypertension: Secondary | ICD-10-CM | POA: Diagnosis not present

## 2018-11-16 DIAGNOSIS — Z87891 Personal history of nicotine dependence: Secondary | ICD-10-CM

## 2018-11-16 MED ORDER — RAMIPRIL 5 MG PO CAPS: 5.0000 mg | ORAL_CAPSULE | Freq: Every day | ORAL | 3 refills | Status: DC

## 2018-11-16 NOTE — Patient Instructions (Signed)
Medication Instructions:  Your physician recommends that you continue on your current medications as directed. Please refer to the Current Medication list given to you today. A refill of your Ramipril was sent to your pharmacy   Lab work: None Ordered    Testing/Procedures: None Ordered   Follow-Up: At [facility], you and your health needs are our priority.  As part of our continuing mission to provide you with exceptional heart care, we have created designated Provider Care Teams.  These Care Teams include your primary Cardiologist (physician) and Advanced Practice Providers (APPs -  Physician Assistants and Nurse Practitioners) who all work together to provide you with the care you need, when you need it. You will need a follow up appointment in:  6 months.  Please call our office 2 months in advance to schedule this appointment.  You may see Mertie Moores, MD or one of the following Advanced Practice Providers on your designated Care Team: Richardson Dopp, PA-C [place], [place] . Daune Perch, NP

## 2018-11-16 NOTE — Progress Notes (Signed)
Virtual Visit via Video Note    Evaluation Performed:  Follow-up visit  This visit type was conducted due to national recommendations for restrictions regarding the COVID-19 Pandemic (e.g. social distancing).  This format is felt to be most appropriate for this patient at this time.  All issues noted in this document were discussed and addressed.  No physical exam was performed (except for noted visual exam findings with Video Visits).  Please refer to the patient's chart (MyChart message for video visits and phone note for telephone visits) for the patient's consent to telehealth for [facility].  Date:  11/16/2018   ID:  Geoffrey Wood, DOB 03-08-1946, MRN [id number]  Patient Location:  Home   Provider location:   [place], [place], [place]   PCP:  Crist Infante, MD  Cardiologist:  Mertie Moores, MD  Electrophysiologist:  None   Chief Complaint:  Follow up HTN, hyperlipidemia, diabetes mellitus and area abdominal aortic aneurysm repair.  History of Present Illness:    Geoffrey Wood is a 73 y.o. male who presents via audio/video conferencing for a telehealth visit today.    Luc is a 73 year old gentleman with a history of hypertension, hyperlipidemia and peripheral vascular disease.  He has a history of abdominal aortic aneurysm repair by Dr. Gae Gallop.   Has stopped smoking 1 year ago.  He enjoys playing golf.  Walks 1.5 miles a day  Watching his diet  Had a CT chest  - showed 3 V Coronary calcification Walks every day without angina   Has is lipids managed by Dr. Joylene Draft.   November 29, 2017 Chol =  125 HDL =  35 LDL  =  60 Trigs  = 152   The patient does not have symptoms concerning for COVID-19 infection (fever, chills, cough, or new shortness of breath).    Prior CV studies:   The following studies were reviewed today:    Past Medical History:  Diagnosis Date  . Adenomatous colon polyp   . Cataract   . Diabetes mellitus   . Emphysema   .  Emphysema of lung ([place])   . Erectile dysfunction   . Hyperlipidemia   . Hypertension   . Peripheral vascular disease [facility])    Past Surgical History:  Procedure Laterality Date  . ABDOMINAL AORTIC ANEURYSM REPAIR  2004  . COLONOSCOPY  multiple since 2004     Current Meds  Medication Sig  . ANORO ELLIPTA 62.5-25 MCG/INH AEPB Take 1 puff by mouth daily.  Marland Kitchen aspirin 81 MG tablet Take 81 mg by mouth daily.    . Cholecalciferol (VITAMIN D PO) Take 1,000 Units by mouth daily.   . Cyanocobalamin (VITAMIN B 12 PO) Take 500 mcg by mouth daily.   Marland Kitchen ezetimibe (ZETIA) 10 MG tablet Take 10 mg by mouth daily.   Marland Kitchen LIPITOR 80 MG tablet Take 1 tablet by mouth Daily.  . niacin 500 MG tablet Take 500 mg by mouth 4 (four) times daily.   Marland Kitchen omeprazole (PRILOSEC) 20 MG capsule Take 1 capsule by mouth 2 (two) times a week.   Glory Rosebush VERIO test strip   . ramipril (ALTACE) 5 MG capsule Take 1 capsule (5 mg total) by mouth daily. Please keep 08/23/17 appointment for further refills  . SYNJARDY 12.12-998 MG TABS Take 1 tablet by mouth 2 (two) times daily.   Current Facility-Administered Medications for the 11/16/18 encounter (Telemedicine) with , Wonda Cheng, MD  Medication  . 0.9 %  sodium  chloride infusion     Allergies:   Patient has no known allergies.   Social History   Tobacco Use  . Smoking status: Smoker, Current Status Unknown    Packs/day: 1.00    Years: 50.00    Pack years: 50.00    Types: Cigarettes    Last attempt to quit: 09/17/2013    Years since quitting: 5.1  . Smokeless tobacco: Never Used  Substance Use Topics  . Alcohol use: Yes    Comment: not even 1 drink a week  . Drug use: No     Family Hx: The patient's family history includes Cancer in his father; Diabetes in his mother; Heart failure in his mother. There is no history of Colon cancer, Esophageal cancer, Liver cancer, Pancreatic cancer, Rectal cancer, or Stomach cancer.  ROS:   Please see the history of present  illness.     All other systems reviewed and are negative.   Labs/Other Tests and Data Reviewed:    Recent Labs: No results found for requested labs within last 8760 hours.   Recent Lipid Panel No results found for: CHOL, TRIG, HDL, CHOLHDL, LDLCALC, LDLDIRECT  Wt Readings from Last 3 Encounters:  11/16/18 174 lb 3.2 oz (79 kg)  11/09/17 187 lb (84.8 kg)  10/26/17 187 lb 3.2 oz (84.9 kg)     Objective:    Vital Signs:  BP 127/76   Pulse 74   Ht 6' (1.829 m)   Wt 174 lb 3.2 oz (79 kg)   BMI 23.63 kg/m    General:   Appears healthy,   NAD HEENT:   No obvious JVD or lymphadenopathy Resp:   Normal work of breathing,   resp rate is normal  CV :   BP and HR are normal ,  No edema Abd:   No abdomina swelling , Ext:   No clubbing, cyanosis, or edema  Neuro:   Alert and oriented x 3.   No obvious motor deficits Skin : no obvious rashes    ASSESSMENT & PLAN:    1.  CAD  : Bayani has a long history of cigarette smoking and has known peripheral vascular disease.  A CT scan recently revealed three-vessel coronary artery calcifications.  He is not having any episodes of chest pain or shortness of breath.  The CT scan also revealed severe COPD.  Given his age and the fact that he has severe COPD I am inclined to wait and see if he has episodes of angina.  I am quite sure that he would have three-vessel coronary artery disease but at present he is not having any symptoms.  We will continue medical therapy.  2.  Hyperlipidemia.  He had his last lipids drawn at Dr. Silvestre Mesi office.  His lipids look well controlled.  Continue current medications.  3.  Essential hypertension: The patient has well-controlled blood pressure.  Continue current medications    COVID-19 Education: The signs and symptoms of COVID-19 were discussed with the patient and how to seek care for testing (follow up with PCP or arrange E-visit).  The importance of social distancing was discussed today.  Patient Risk:    After full review of this patient's clinical status, I feel that they are at least moderate risk at this time.  Time:   Today, I have spent  25  minutes with the patient with telehealth technology discussing CAD, symptoms of angian,  PVD, healthy diet .     Medication Adjustments/Labs and Tests Ordered:  Current medicines are reviewed at length with the patient today.  Concerns regarding medicines are outlined above.  Tests Ordered: No orders of the defined types were placed in this encounter.  Medication Changes: No orders of the defined types were placed in this encounter.   Disposition:  Follow up in 6 month(s)  Signed, Mertie Moores, MD  11/16/2018 4:01 PM    [facility]

## 2019-05-23 ENCOUNTER — Other Ambulatory Visit: Payer: Self-pay

## 2019-05-23 ENCOUNTER — Ambulatory Visit (INDEPENDENT_AMBULATORY_CARE_PROVIDER_SITE_OTHER): Payer: Managed Care, Other (non HMO) | Admitting: Cardiovascular Disease

## 2019-05-23 ENCOUNTER — Encounter: Payer: Self-pay | Admitting: Cardiovascular Disease

## 2019-05-23 VITALS — BP 130/82 | HR 74 | Ht 72.0 in | Wt 173.0 lb

## 2019-05-23 DIAGNOSIS — I2584 Coronary atherosclerosis due to calcified coronary lesion: Secondary | ICD-10-CM | POA: Diagnosis not present

## 2019-05-23 DIAGNOSIS — I2510 Atherosclerotic heart disease of native coronary artery without angina pectoris: Secondary | ICD-10-CM | POA: Diagnosis not present

## 2019-05-23 DIAGNOSIS — I10 Essential (primary) hypertension: Secondary | ICD-10-CM | POA: Diagnosis not present

## 2019-05-23 DIAGNOSIS — E782 Mixed hyperlipidemia: Secondary | ICD-10-CM

## 2019-05-23 NOTE — Progress Notes (Signed)
Geoffrey Wood Date of Birth  07/24/1946 [facility] / [facility] [id number] [address]     [place], [place]  [address] [phone]  Fax  [phone]  Problem List 1. HTN 2. Hyperlipidemia 3. PVD 4. Diabetes Mellitus 5. AAA repair -  Gae Gallop, MD ( 2003)     History of Present Illness:  Geoffrey Wood is a 73 year old gentleman with a history of hypertension, hyperlipidemia, and peripheral vascular disease. He's done well since I saw him a year ago. Unfortunately, he continues to smoke. He's not had any episodes of chest pain or shortness of breath.  Playing lots of golf.  He still works - Passenger transport manager.  He still smokes - he's trying the new E-cigarettes.   Nov. 13, 2014:  Not much new.  Still smoiking  January 16, 2014:  Geoffrey Wood is doing well.  He has stopped smoking and is using an e-cig.  Starting an exercise program.   No CP so far.  Walking on the treadmill - up to 15-20 minutes a day.    Still working.      Aug. 30, 2016  Still not smoking  Lipids followed by Perini No CP, no dyspnea. Still works - Engineer, maintenance (IT) . Plays golf 3-4 days a week .    Aug. 31, 2017: Geoffrey Wood is seen today for follow up of his HTN and hyperlipidemia Plays golf at the [employer]  Some shortness of breath walking up hills, no CP   Jan. 7, 2019  Doing well Still smoking - wants to quit -   May 23, 2019:  Geoffrey Wood seen today for a follow-up visit. We had a telemedicine visit on November 16, 2018.  His lipids have been well controlled and are managed by Dr. Joylene Draft.      He has had a CT scan which revealed three-vessel coronary artery calcifications.  He is not having any episodes of chest discomfort.  Has quit smoking 6 ago.   Is starting  to exercise - 1.5 miles a day .  Some hills  No CP    Current Outpatient Medications on File Prior to Visit  Medication Sig Dispense Refill  . ANORO ELLIPTA 62.5-25  MCG/INH AEPB Take 1 puff by mouth daily.    Marland Kitchen aspirin 81 MG tablet Take 81 mg by mouth daily.      . Cholecalciferol (VITAMIN D PO) Take 1,000 Units by mouth daily.     . Cyanocobalamin (VITAMIN B 12 PO) Take 500 mcg by mouth daily.     Marland Kitchen ezetimibe (ZETIA) 10 MG tablet Take 10 mg by mouth daily.     Marland Kitchen LIPITOR 80 MG tablet Take 1 tablet by mouth Daily.    . niacin 500 MG tablet Take 500 mg by mouth 4 (four) times daily.     Marland Kitchen omeprazole (PRILOSEC) 20 MG capsule Take 1 capsule by mouth 2 (two) times a week.     Glory Rosebush VERIO test strip     . ramipril (ALTACE) 5 MG capsule Take 1 capsule (5 mg total) by mouth daily. 90 capsule 3  . SYNJARDY 12.12-998 MG TABS Take 1 tablet by mouth 2 (two) times daily.     Current Facility-Administered Medications on File Prior to Visit  Medication Dose Route Frequency Provider Last Rate Last Dose  . 0.9 %  sodium chloride infusion  500 mL Intravenous Once Gatha Mayer, MD  No Known Allergies  Past Medical History:  Diagnosis Date  . Adenomatous colon polyp   . Cataract   . Diabetes mellitus   . Emphysema   . Emphysema of lung ([place])   . Erectile dysfunction   . Hyperlipidemia   . Hypertension   . Peripheral vascular disease [facility])     Past Surgical History:  Procedure Laterality Date  . ABDOMINAL AORTIC ANEURYSM REPAIR  2004  . COLONOSCOPY  multiple since 2004    Social History   Tobacco Use  Smoking Status Smoker, Current Status Unknown  . Packs/day: 1.00  . Years: 50.00  . Pack years: 50.00  . Types: Cigarettes  . Last attempt to quit: 09/17/2013  . Years since quitting: 5.6  Smokeless Tobacco Never Used    Social History   Substance and Sexual Activity  Alcohol Use Yes   Comment: not even 1 drink a week    Family History  Problem Relation Age of Onset  . Heart failure Mother   . Diabetes Mother   . Cancer Father        lung  . Colon cancer Neg Hx   . Esophageal cancer Neg Hx   . Liver cancer Neg Hx   .  Pancreatic cancer Neg Hx   . Rectal cancer Neg Hx   . Stomach cancer Neg Hx     Reviw of Systems:  Reviewed in the HPI.  All other systems are negative.  Physical Exam: Blood pressure 130/82, pulse 74, height 6' (1.829 m), weight 173 lb (78.5 kg), SpO2 98 %.  GEN:  Elderly male,  NAD  HEENT: Normal NECK: No JVD; No carotid bruits LYMPHATICS: No lymphadenopathy CARDIAC: RRR , no murmurs, rubs, gallops RESPIRATORY:  Clear to auscultation without rales, wheezing or rhonchi  ABDOMEN: Soft, non-tender, non-distended MUSCULOSKELETAL:  No edema; No deformity  SKIN: Warm and dry NEUROLOGIC:  Alert and oriented x 3   ECG: May 23, 2019: Normal sinus rhythm at 74 beats a minute.  Voltage criteria for left ventricular hypertrophy.  Assessment / Plan:   1. HTN-   -blood pressure is well controlled.  Continue current medications.  2. Hyperlipidemia -    .  Overall lipids look great.  Continue niacin and acetamide and atorvastatin 80 mg a day.  3. PVD -status post abdominal aortic aneurysm repair in 2003.  4.  Coronary artery calcifications: Geoffrey Wood has documented three-vessel coronary artery calcifications.  He has stopped smoking as of 6 months ago and is started exercising.  He still does not have any symptoms.  His EKG does not show any acute changes.  We had a long discussion about further work-up.  At this point he is comfortable continuing to exercise and continuing with conservative management.  If he starts to have any angina we will work him up further for the ischemia including coronary CT angiogram versus stress testing versus cath.   5. AAA repair -  Gae Gallop, MD ( 2003)      Mertie Moores, MD  05/23/2019 9:54 AM    [facility] [place],  [place], [place]  [address] Pager [phone] Phone: [phone]; Fax: [phone]

## 2019-05-23 NOTE — Patient Instructions (Signed)
Medication Instructions:  Your provider recommends that you continue on your current medications as directed. Please refer to the Current Medication list given to you today.    Labwork: None  Testing/Procedures: None  Follow-Up: Your provider wants you to follow-up in: 1 year with Dr. Acie Fredrickson. You will receive a reminder letter in the mail two months in advance. If you don't receive a letter, please call our office to schedule the follow-up appointment.    Any Other Special Instructions Will Be Listed Below (If Applicable).     If you need a refill on your cardiac medications before your next appointment, please call your pharmacy.

## 2019-08-14 ENCOUNTER — Other Ambulatory Visit: Payer: Self-pay | Admitting: Internal Medicine

## 2019-08-14 DIAGNOSIS — R911 Solitary pulmonary nodule: Secondary | ICD-10-CM

## 2019-09-09 ENCOUNTER — Ambulatory Visit: Payer: Medicare Other | Attending: Internal Medicine

## 2019-09-09 DIAGNOSIS — Z23 Encounter for immunization: Secondary | ICD-10-CM | POA: Insufficient documentation

## 2019-09-09 NOTE — Progress Notes (Signed)
   Covid-19 Vaccination Clinic  Name:  Geoffrey Wood    MRN: [id number] DOB: September 08, 1945  09/09/2019  [name] was observed post Covid-19 immunization for 15 minutes without incidence. He was provided with Vaccine Information Sheet and instruction to access the V-Safe system.   [name] was instructed to call 911 with any severe reactions post vaccine: Marland Kitchen Difficulty breathing  . Swelling of your face and throat  . A fast heartbeat  . A bad rash all over your body  . Dizziness and weakness    Immunizations Administered    Name Date Dose VIS Date Route   Pfizer COVID-19 Vaccine 09/09/2019 11:30 AM 0.3 mL 07/28/2019 Intramuscular   Manufacturer: [place]   Lot: [id number]   [place]: [id number]

## 2019-09-26 ENCOUNTER — Ambulatory Visit: Payer: Self-pay

## 2019-09-30 ENCOUNTER — Ambulatory Visit: Payer: Medicare Other | Attending: Internal Medicine

## 2019-09-30 DIAGNOSIS — Z23 Encounter for immunization: Secondary | ICD-10-CM | POA: Insufficient documentation

## 2019-09-30 NOTE — Progress Notes (Signed)
   Covid-19 Vaccination Clinic  Name:  Geoffrey Wood    MRN: [id number] DOB: 16-Jul-1946  09/30/2019  [name] was observed post Covid-19 immunization for 15 minutes without incidence. He was provided with Vaccine Information Sheet and instruction to access the V-Safe system.   [name] was instructed to call 911 with any severe reactions post vaccine: Marland Kitchen Difficulty breathing  . Swelling of your face and throat  . A fast heartbeat  . A bad rash all over your body  . Dizziness and weakness    Immunizations Administered    Name Date Dose VIS Date Route   Pfizer COVID-19 Vaccine 09/30/2019 10:13 AM 0.3 mL 07/28/2019 Intramuscular   Manufacturer: [place]   Lot: [id number]   [place]: [id number]

## 2019-10-23 ENCOUNTER — Ambulatory Visit
Admission: RE | Admit: 2019-10-23 | Discharge: 2019-10-23 | Disposition: A | Discharge status: Home / Self Care | Payer: Medicare Other | Source: Ambulatory Visit | Attending: Internal Medicine | Admitting: Internal Medicine

## 2019-10-23 DIAGNOSIS — R911 Solitary pulmonary nodule: Secondary | ICD-10-CM

## 2019-10-27 ENCOUNTER — Other Ambulatory Visit: Payer: Self-pay | Admitting: Internal Medicine

## 2019-10-30 ENCOUNTER — Other Ambulatory Visit: Payer: Self-pay | Admitting: Internal Medicine

## 2019-10-30 DIAGNOSIS — R911 Solitary pulmonary nodule: Secondary | ICD-10-CM

## 2020-02-13 DIAGNOSIS — R634 Abnormal weight loss: Secondary | ICD-10-CM | POA: Insufficient documentation

## 2020-03-25 ENCOUNTER — Other Ambulatory Visit: Payer: Medicare Other

## 2020-04-12 ENCOUNTER — Other Ambulatory Visit: Payer: Self-pay | Admitting: Internal Medicine

## 2020-04-12 DIAGNOSIS — R911 Solitary pulmonary nodule: Secondary | ICD-10-CM

## 2020-04-16 ENCOUNTER — Ambulatory Visit
Admission: RE | Admit: 2020-04-16 | Discharge: 2020-04-16 | Disposition: A | Discharge status: Home / Self Care | Payer: Medicare Other | Source: Ambulatory Visit | Attending: Internal Medicine | Admitting: Internal Medicine

## 2020-04-16 ENCOUNTER — Other Ambulatory Visit: Payer: Self-pay

## 2020-04-16 DIAGNOSIS — R911 Solitary pulmonary nodule: Secondary | ICD-10-CM

## 2020-04-25 ENCOUNTER — Ambulatory Visit (INDEPENDENT_AMBULATORY_CARE_PROVIDER_SITE_OTHER): Payer: Medicare Other | Admitting: Otolaryngology

## 2020-05-02 ENCOUNTER — Encounter (INDEPENDENT_AMBULATORY_CARE_PROVIDER_SITE_OTHER): Payer: Self-pay | Admitting: Otolaryngology

## 2020-05-02 ENCOUNTER — Ambulatory Visit (INDEPENDENT_AMBULATORY_CARE_PROVIDER_SITE_OTHER): Payer: Medicare Other | Admitting: Otolaryngology

## 2020-05-02 ENCOUNTER — Other Ambulatory Visit (INDEPENDENT_AMBULATORY_CARE_PROVIDER_SITE_OTHER): Payer: Self-pay

## 2020-05-02 ENCOUNTER — Other Ambulatory Visit: Payer: Self-pay

## 2020-05-02 ENCOUNTER — Other Ambulatory Visit (HOSPITAL_COMMUNITY)
Admission: RE | Admit: 2020-05-02 | Discharge: 2020-05-02 | Disposition: A | Discharge status: Home / Self Care | Payer: Medicare Other | Source: Ambulatory Visit | Attending: Otolaryngology | Admitting: Otolaryngology

## 2020-05-02 VITALS — Temp 97.2°F

## 2020-05-02 DIAGNOSIS — H6121 Impacted cerumen, right ear: Secondary | ICD-10-CM | POA: Diagnosis not present

## 2020-05-02 DIAGNOSIS — K118 Other diseases of salivary glands: Secondary | ICD-10-CM

## 2020-05-02 DIAGNOSIS — D490 Neoplasm of unspecified behavior of digestive system: Secondary | ICD-10-CM | POA: Insufficient documentation

## 2020-05-02 NOTE — Progress Notes (Signed)
HPI: Geoffrey Wood is a 74 y.o. male who presents for evaluation of a new right parotid mass that developed over the past few months.  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  He has had no mouth or throat symptoms since then.  He has had some slight discomfort in the right ear secondary to wax. He has history of COPD and type 2 diabetes but is otherwise healthy. He takes a baby aspirin. Denies any cardiac disease.  Past Medical History:  Diagnosis Date  . Adenomatous colon polyp   . Cataract   . Diabetes mellitus   . Emphysema   . Emphysema of lung ([place])   . Erectile dysfunction   . Hyperlipidemia   . Hypertension   . Peripheral vascular disease [facility])    Past Surgical History:  Procedure Laterality Date  . ABDOMINAL AORTIC ANEURYSM REPAIR  2004  . COLONOSCOPY  multiple since 2004   Social History   Socioeconomic History  . Marital status: Married    Spouse name: Not on file  . Number of children: Not on file  . Years of education: Not on file  . Highest education level: Not on file  Occupational History  . Occupation: Scientist, clinical (histocompatibility and immunogenetics): Designer, television/film set  Tobacco Use  . Smoking status: Smoker, Current Status Unknown    Packs/day: 1.00    Years: 50.00    Pack years: 50.00    Types: Cigarettes    Last attempt to quit: 09/17/2013    Years since quitting: 6.6  . Smokeless tobacco: Never Used  Vaping Use  . Vaping Use: Never used  Substance and Sexual Activity  . Alcohol use: Yes    Comment: not even 1 drink a week  . Drug use: No  . Sexual activity: Not on file  Other Topics Concern  . Not on file  Social History Narrative  . Not on file   Social Determinants of Health   Financial Resource Strain:   . Difficulty of Paying Living Expenses: Not on file  Food  Insecurity:   . Worried About Charity fundraiser in the Last Year: Not on file  . Ran Out of Food in the Last Year: Not on file  Transportation Needs:   . Lack of Transportation (Medical): Not on file  . Lack of Transportation (Non-Medical): Not on file  Physical Activity:   . Days of Exercise per Week: Not on file  . Minutes of Exercise per Session: Not on file  Stress:   . Feeling of Stress : Not on file  Social Connections:   . Frequency of Communication with Friends and Family: Not on file  . Frequency of Social Gatherings with Friends and Family: Not on file  . Attends Religious Services: Not on file  . Active Member of Clubs or Organizations: Not on file  . Attends Archivist Meetings: Not on file  . Marital Status: Not on file   Family History  Problem Relation Age of Onset  . Heart failure Mother   . Diabetes Mother   . Cancer Father        lung  . Colon cancer Neg Hx   . Esophageal cancer Neg Hx   . Liver cancer Neg Hx   .  Pancreatic cancer Neg Hx   . Rectal cancer Neg Hx   . Stomach cancer Neg Hx    No Known Allergies Prior to Admission medications   Medication Sig Start Date End Date Taking? Authorizing Provider  ANORO ELLIPTA 62.5-25 MCG/INH AEPB Take 1 puff by mouth daily. 08/20/17  Yes [provider]  aspirin 81 MG tablet Take 81 mg by mouth daily.     Yes [provider]  Cholecalciferol (VITAMIN D PO) Take 1,000 Units by mouth daily.    Yes [provider]  Cyanocobalamin (VITAMIN B 12 PO) Take 500 mcg by mouth daily.    Yes [provider]  ezetimibe (ZETIA) 10 MG tablet Take 10 mg by mouth daily.    Yes [provider]  LIPITOR 80 MG tablet Take 1 tablet by mouth Daily. 11/04/11  Yes [provider]  niacin 500 MG tablet Take 500 mg by mouth 4 (four) times daily.    Yes [provider]  omeprazole (PRILOSEC) 20 MG capsule Take 1 capsule by mouth 2 (two) times a week.  06/02/17  Yes  [provider]  ONETOUCH VERIO test strip  07/12/18  Yes [provider]  ramipril (ALTACE) 5 MG capsule Take 1 capsule (5 mg total) by mouth daily. 11/16/18  Yes Nahser, Wonda Cheng, MD  SYNJARDY 12.12-998 MG TABS Take 1 tablet by mouth 2 (two) times daily. 06/29/17  Yes [provider]     Positive ROS: Otherwise negative  All other systems have been reviewed and were otherwise negative with the exception of those mentioned in the HPI and as above.  Physical Exam: Constitutional: Alert, well-appearing, no acute distress Ears: External ears without lesions or tenderness.  He has some impacted cerumen in the right ear canal that was removed in the office using curettes.  The right ear canal and TM are otherwise clear with no signs of infection or swelling.  Right ear discomfort was better after cleaning the wax from the right ear canal.. Nasal: External nose without lesions.. Clear nasal passages. Oral: Lips and gums without lesions. Tongue and palate mucosa without lesions. Posterior oropharynx clear.  Tongue was normal to examination with no evidence of recurrent cancer. Neck: No palpable adenopathy or masses.  There was no palpable adenopathy in the neck.  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  Fine-needle aspirate was performed in the office today after discussion with the patient.  Area was injected with 1 cc of Xylocaine with epinephrine prior to FNA. Respiratory: Breathing comfortably  Skin: No facial/neck lesions or rash noted.  FNA  Date/Time: 05/02/2020 6:51 PM Performed by: Rozetta Nunnery, MD Authorized by: Rozetta Nunnery, MD   Consent:    Consent obtained:  Verbal   Consent given by:  Patient   Risks discussed:  Bleeding and pain   Alternatives discussed:  No  treatment Pre-procedure details:    Indications: neoplasm of uncertain origin of the neck   Anesthesia:    Anesthesia method:  Local infiltration   Local anesthetic:  1% Xylocaine with epi   Amount:  0.5 cc Procedure Details:    Needle:  21 G Post-procedure details:    Patient tolerance of procedure:  Tolerated well, no immediate complications Comments:     Needle aspirate of a right parotid mass  was obtained in the office and sent on slides to pathology. Cerumen impaction removal  Date/Time: 05/02/2020 6:55 PM Performed by: Rozetta Nunnery, MD Authorized by: Rozetta Nunnery, MD   Consent:    Consent obtained:  Verbal   Consent given by:  Patient   Risks discussed:  Pain and bleeding Procedure details:    Location:  R ear   Procedure type: curette and forceps   Post-procedure details:    Inspection:  TM intact and canal normal   Hearing quality:  Improved   Patient tolerance of procedure:  Tolerated well, no immediate complications     Assessment: Right parotid or preauricular mass.  No neck adenopathy palpable. Right cerumen impaction  Plan: Fine-needle aspirate of the right parotid mass was obtained in the office.  Patient had a previous history of right scalp skin cancer removed approximately 3 years ago. CT scan of the right neck and right parotid region was ordered in the office today.  Radene Journey, MD

## 2020-05-03 LAB — CYTOLOGY - NON PAP

## 2020-05-07 ENCOUNTER — Telehealth (INDEPENDENT_AMBULATORY_CARE_PROVIDER_SITE_OTHER): Payer: Self-pay | Admitting: Otolaryngology

## 2020-05-07 ENCOUNTER — Other Ambulatory Visit (INDEPENDENT_AMBULATORY_CARE_PROVIDER_SITE_OTHER): Payer: Self-pay

## 2020-05-07 DIAGNOSIS — C07 Malignant neoplasm of parotid gland: Secondary | ICD-10-CM

## 2020-05-07 NOTE — Telephone Encounter (Signed)
Called patient concerning results of his fine-needle aspirate of the right parotid mass.  This showed malignant cells.  Probably related to skin cancer removed a little over a year ago from his right forehead.  His wife states that this was removed in June of last year instead of 2 to 3 years ago when Markeith thought it was removed. Discussed with both of them that he will require surgery and probably postop radiation therapy. He apparently has appointments to see his cardiologist as well as a pulmonary doctor and would recommend having this performed in the next couple of weeks as these appointments will need to be completed prior to scheduling surgery. Also discussed with him concerning scheduling a PET scan and we will plan on scheduling this hopefully in the next week or 2.  Patient has a CT scan already scheduled for September 30th.

## 2020-05-08 ENCOUNTER — Other Ambulatory Visit (INDEPENDENT_AMBULATORY_CARE_PROVIDER_SITE_OTHER): Payer: Self-pay

## 2020-05-08 DIAGNOSIS — C07 Malignant neoplasm of parotid gland: Secondary | ICD-10-CM

## 2020-05-15 ENCOUNTER — Other Ambulatory Visit: Payer: Self-pay

## 2020-05-15 ENCOUNTER — Ambulatory Visit (HOSPITAL_COMMUNITY)
Admission: RE | Admit: 2020-05-15 | Discharge: 2020-05-15 | Disposition: A | Discharge status: Home / Self Care | Payer: Medicare Other | Source: Ambulatory Visit | Attending: Otolaryngology | Admitting: Otolaryngology

## 2020-05-15 DIAGNOSIS — C07 Malignant neoplasm of parotid gland: Secondary | ICD-10-CM | POA: Insufficient documentation

## 2020-05-15 LAB — GLUCOSE, CAPILLARY: Glucose-Capillary: 149 mg/dL — ABNORMAL HIGH (ref 70–99)

## 2020-05-15 MED ORDER — FLUDEOXYGLUCOSE F - 18 (FDG) INJECTION
9.7000 | Freq: Once | INTRAVENOUS | Status: AC | PRN
  Administered 2020-05-15: 9.7 via INTRAVENOUS

## 2020-05-16 ENCOUNTER — Ambulatory Visit
Admission: RE | Admit: 2020-05-16 | Discharge: 2020-05-16 | Disposition: A | Discharge status: Home / Self Care | Payer: Medicare Other | Source: Ambulatory Visit | Attending: Otolaryngology | Admitting: Otolaryngology

## 2020-05-16 DIAGNOSIS — D490 Neoplasm of unspecified behavior of digestive system: Secondary | ICD-10-CM

## 2020-05-16 MED ORDER — IOPAMIDOL (ISOVUE-300) INJECTION 61%
75.0000 mL | Freq: Once | INTRAVENOUS | Status: AC | PRN
  Administered 2020-05-16: 75 mL via INTRAVENOUS

## 2020-05-17 ENCOUNTER — Telehealth (INDEPENDENT_AMBULATORY_CARE_PROVIDER_SITE_OTHER): Payer: Self-pay

## 2020-05-28 ENCOUNTER — Encounter: Payer: Self-pay | Admitting: Cardiovascular Disease

## 2020-05-28 NOTE — Progress Notes (Signed)
Geoffrey Wood Date of Birth  [birth year] [facility] / [facility] [address]     [place], [place]  [address] [phone]  Fax  [phone]  Problem List 1. HTN 2. Hyperlipidemia 3. PVD 4. Diabetes Mellitus 5. AAA repair -  Geoffrey Gallop, MD ( 2003)     Prior Notes:   Geoffrey Wood is a 74 year old gentleman with a history of hypertension, hyperlipidemia, and peripheral vascular disease. He's done well since I saw him a year ago. Unfortunately, he continues to smoke. He's not had any episodes of chest pain or shortness of breath.  Playing lots of golf.  He still works - Passenger transport manager.  He still smokes - he's trying the new E-cigarettes.   Nov. 13, 2014:  Not much new.  Still smoiking  January 16, 2014:  Geoffrey Wood is doing well.  He has stopped smoking and is using an e-cig.  Starting an exercise program.   No CP so far.  Walking on the treadmill - up to 15-20 minutes a day.    Still working.      Aug. 30, 2016  Still not smoking  Lipids followed by Perini No CP, no dyspnea. Still works - Engineer, maintenance (IT) . Plays golf 3-4 days a week .    Aug. 31, 2017: Geoffrey Wood is seen today for follow up of his HTN and hyperlipidemia Plays golf at the [employer]  Some shortness of breath walking up hills, no CP   Jan. 7, 2019  Doing well Still smoking - wants to quit -   May 23, 2019:  Geoffrey Wood seen today for a follow-up visit. We had a telemedicine visit on November 16, 2018.  His lipids have been well controlled and are managed by Geoffrey Wood.      He has had a CT scan which revealed three-vessel coronary artery calcifications.  He is not having any episodes of chest discomfort.  Has quit smoking 6 ago.   Is starting  to exercise - 1.5 miles a day .  Some hills  No CP   Oct. 13, 2021:  Geoffrey Wood is seen today for follow up visit He has 3 V Coronary artery calcifications. Has not been having angina.    Has been walking  Has developed a Right paratid cancer.  Surgery at [facility] .   Is going for surgery on Mon. Oct. 18.  Has stopped smoking   No CP or dyspnea. Plays golf, walks on occasion  Has labs / physical with Geoffrey Wood in January  Lipids managed by Geoffrey Wood  Current Outpatient Medications on File Prior to Visit  Medication Sig Dispense Refill  . ANORO ELLIPTA 62.5-25 MCG/INH AEPB Take 1 puff by mouth daily.    Marland Kitchen aspirin 81 MG tablet Take 81 mg by mouth daily.      . Cholecalciferol (VITAMIN D PO) Take 1,000 Units by mouth daily.     . Cyanocobalamin (VITAMIN B 12 PO) Take 500 mcg by mouth daily.     Marland Kitchen ezetimibe (ZETIA) 10 MG tablet Take 10 mg by mouth daily.     Marland Kitchen LIPITOR 80 MG tablet Take 1 tablet by mouth Daily.    . niacin 500 MG tablet Take 500 mg by mouth 4 (four) times daily.     Geoffrey Wood VERIO test strip     . ramipril (ALTACE) 5 MG capsule Take 1 capsule (5 mg total) by mouth daily. [place]  capsule 3  . SYNJARDY 12.12-998 MG TABS Take 1 tablet by mouth 2 (two) times daily.     Current Facility-Administered Medications on File Prior to Visit  Medication Dose Route Frequency Provider Last Rate Last Admin  . 0.9 %  sodium chloride infusion  500 mL Intravenous Once Geoffrey Mayer, MD        No Known Allergies  Past Medical History:  Diagnosis Date  . Adenomatous colon polyp   . Cataract   . Diabetes mellitus   . Emphysema   . Emphysema of lung (Geoffrey Wood)   . Erectile dysfunction   . Hyperlipidemia   . Hypertension   . Peripheral vascular disease Geoffrey Wood)     Past Surgical History:  Procedure Laterality Date  . ABDOMINAL AORTIC ANEURYSM REPAIR  2004  . COLONOSCOPY  multiple since 2004    Social History   Tobacco Use  Smoking Status Smoker, Current Status Unknown  . Packs/day: 1.00  . Years: 50.00  . Pack years: 50.00  . Types: Cigarettes  . Last attempt to quit: 09/17/2013  . Years since quitting: 6.7  Smokeless Tobacco Never Used    Social  History   Substance and Sexual Activity  Alcohol Use Yes   Comment: not even 1 drink a week    Family History  Problem Relation Age of Onset  . Heart failure Mother   . Diabetes Mother   . Cancer Father        lung  . Colon cancer Neg Hx   . Esophageal cancer Neg Hx   . Liver cancer Neg Hx   . Pancreatic cancer Neg Hx   . Rectal cancer Neg Hx   . Stomach cancer Neg Hx     Reviw of Systems:  Reviewed in the HPI.  All other systems are negative.   Physical Exam: Blood pressure 108/60, pulse 83, height 6' (1.829 m), weight 163 lb 9.6 oz (74.2 kg), SpO2 93 %.  GEN:  Well nourished, well developed in no acute distress HEENT: moderately enlarged right  parotid gland.   Due to cancer  NECK: No JVD; No carotid bruits LYMPHATICS: No lymphadenopathy CARDIAC: RRR , no murmurs, rubs, gallops RESPIRATORY:  Clear to auscultation without rales, wheezing or rhonchi  ABDOMEN: Soft, non-tender, non-distended MUSCULOSKELETAL:  No edema; No deformity  SKIN: Warm and dry NEUROLOGIC:  Alert and oriented x 3   ECG:  Oct. 13 2021 NSR at 83.   Nonspecific ST changs in the inferior leads.   No significant changes from previous ECG    Assessment / Plan:   1. HTN-   BP is well controlled.   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  Geoffrey Wood at [facility].    He has no angina .  He is at low risk for CV complications.  He may hold his ASA for 5-7 days prior to surgery  ( He has already stopped his ASA in preparation for this surgery )   2. Hyperlipidemia -    .managed by Geoffrey Wood .  3. PVD -status post abdominal aortic aneurysm repair in 2003.   4.  Coronary artery calcifications: [name] has documented three-vessel coronary artery calcifications.  He has stopped smoking as of 6 months ago and is started exercising.   No angina.  No dyspnea   5. AAA repair -  Geoffrey Gallop, MD ( 2003)    I will see him in 1 year  Geoffrey Moores, MD  05/29/2020  9:23 AM    [place],  [place], [place]  [address] Pager [phone] Phone: [phone]; Fax: [phone]

## 2020-05-29 ENCOUNTER — Encounter: Payer: Self-pay | Admitting: Cardiovascular Disease

## 2020-05-29 ENCOUNTER — Other Ambulatory Visit: Payer: Self-pay

## 2020-05-29 ENCOUNTER — Ambulatory Visit (INDEPENDENT_AMBULATORY_CARE_PROVIDER_SITE_OTHER): Payer: Medicare Other | Admitting: Cardiovascular Disease

## 2020-05-29 VITALS — BP 108/60 | HR 83 | Ht 72.0 in | Wt 163.6 lb

## 2020-05-29 DIAGNOSIS — I2510 Atherosclerotic heart disease of native coronary artery without angina pectoris: Secondary | ICD-10-CM

## 2020-05-29 DIAGNOSIS — E782 Mixed hyperlipidemia: Secondary | ICD-10-CM | POA: Diagnosis not present

## 2020-05-29 DIAGNOSIS — I10 Essential (primary) hypertension: Secondary | ICD-10-CM

## 2020-05-29 DIAGNOSIS — I2584 Coronary atherosclerosis due to calcified coronary lesion: Secondary | ICD-10-CM

## 2020-05-29 NOTE — Patient Instructions (Signed)
Medication Instructions:  No changes *If you need a refill on your cardiac medications before your next appointment, please call your pharmacy*   Lab Work: none If you have labs (blood work) drawn today and your tests are completely normal, you will receive your results only by: MyChart Message (if you have MyChart) OR A paper copy in the mail If you have any lab test that is abnormal or we need to change your treatment, we will call you to review the results.   Testing/Procedures: none   Follow-Up: At [facility], you and your health needs are our priority.  As part of our continuing mission to provide you with exceptional heart care, we have created designated Provider Care Teams.  These Care Teams include your primary Cardiologist (physician) and Advanced Practice Providers (APPs -  Physician Assistants and Nurse Practitioners) who all work together to provide you with the care you need, when you need it.   Your next appointment:   12 month(s)  The format for your next appointment:   In Person  Provider:   You may see Philip Nahser, MD or one of the following Advanced Practice Providers on your designated Care Team:   Scott Weaver, PA-C Vin Bhagat, PA-C   Other Instructions   

## 2020-06-06 ENCOUNTER — Institutional Professional Consult (permissible substitution): Payer: Medicare Other | Admitting: Pulmonary Disease

## 2020-06-10 ENCOUNTER — Other Ambulatory Visit (HOSPITAL_COMMUNITY): Payer: Self-pay | Admitting: Internal Medicine

## 2020-06-10 ENCOUNTER — Other Ambulatory Visit: Payer: Self-pay

## 2020-06-10 ENCOUNTER — Ambulatory Visit (HOSPITAL_COMMUNITY)
Admission: RE | Admit: 2020-06-10 | Discharge: 2020-06-10 | Disposition: A | Discharge status: Home / Self Care | Payer: Medicare Other | Source: Ambulatory Visit | Attending: Internal Medicine | Admitting: Internal Medicine

## 2020-06-10 DIAGNOSIS — M7989 Other specified soft tissue disorders: Secondary | ICD-10-CM

## 2020-06-10 DIAGNOSIS — M79669 Pain in unspecified lower leg: Secondary | ICD-10-CM | POA: Insufficient documentation

## 2020-06-10 NOTE — Progress Notes (Signed)
Lower extremity venous bilateral study completed.  Preliminary results relayed to medical assistant for [place], MD.   See CV Proc for preliminary results report.   Darlin Coco, RDMS

## 2020-06-11 ENCOUNTER — Other Ambulatory Visit (HOSPITAL_COMMUNITY): Payer: Self-pay

## 2020-06-12 ENCOUNTER — Encounter: Payer: Self-pay | Admitting: Hematology

## 2020-06-12 ENCOUNTER — Telehealth: Payer: Self-pay | Admitting: Hematology

## 2020-06-12 ENCOUNTER — Ambulatory Visit (HOSPITAL_COMMUNITY)
Admission: RE | Admit: 2020-06-12 | Discharge: 2020-06-12 | Disposition: A | Discharge status: Home / Self Care | Payer: Medicare Other | Source: Ambulatory Visit | Attending: Internal Medicine | Admitting: Internal Medicine

## 2020-06-12 ENCOUNTER — Other Ambulatory Visit: Payer: Self-pay

## 2020-06-12 DIAGNOSIS — D649 Anemia, unspecified: Secondary | ICD-10-CM | POA: Diagnosis present

## 2020-06-12 LAB — TYPE AND SCREEN: ABO/RH(D): A POS

## 2020-06-12 LAB — PREPARE RBC (CROSSMATCH)

## 2020-06-12 LAB — ABO/RH: ABO/RH(D): A POS

## 2020-06-12 MED ORDER — FUROSEMIDE 10 MG/ML IJ SOLN
20.0000 mg | Freq: Once | INTRAMUSCULAR | Status: AC
  Administered 2020-06-12: 20 mg via INTRAVENOUS

## 2020-06-12 MED ORDER — SODIUM CHLORIDE 0.9% IV SOLUTION: Freq: Once | INTRAVENOUS | Status: DC

## 2020-06-12 MED ORDER — FUROSEMIDE 10 MG/ML IJ SOLN
INTRAMUSCULAR | Status: AC
  Administered 2020-06-12: 20 mg via INTRAVENOUS
  Filled 2020-06-12: qty 2

## 2020-06-12 NOTE — Telephone Encounter (Signed)
Received a new hem referral from [facility] for anemia and thrombocytopenia. Mr. Self has been scheduled to see Dr. Irene Limbo on 11/16 at 1pm. Letter mailed to the pt and referring office notified.

## 2020-06-13 LAB — TYPE AND SCREEN
Antibody Screen: NEGATIVE
Unit division: 0
Unit division: 0

## 2020-06-13 LAB — BPAM RBC
Blood Product Expiration Date: 202111182359
Blood Product Expiration Date: 202111182359
ISSUE DATE / TIME: 202110271020
ISSUE DATE / TIME: 202110271316
Unit Type and Rh: 6200
Unit Type and Rh: 6200

## 2020-06-14 ENCOUNTER — Encounter (HOSPITAL_COMMUNITY): Payer: Self-pay | Admitting: Emergency Medicine

## 2020-06-14 ENCOUNTER — Other Ambulatory Visit: Payer: Self-pay

## 2020-06-14 ENCOUNTER — Emergency Department (HOSPITAL_COMMUNITY)
Admission: EM | Admit: 2020-06-14 | Discharge: 2020-06-14 | Disposition: A | Discharge status: Home / Self Care | Payer: Medicare Other | Attending: Emergency Medicine | Admitting: Emergency Medicine

## 2020-06-14 DIAGNOSIS — F17210 Nicotine dependence, cigarettes, uncomplicated: Secondary | ICD-10-CM | POA: Diagnosis not present

## 2020-06-14 DIAGNOSIS — E119 Type 2 diabetes mellitus without complications: Secondary | ICD-10-CM | POA: Insufficient documentation

## 2020-06-14 DIAGNOSIS — Z79899 Other long term (current) drug therapy: Secondary | ICD-10-CM | POA: Diagnosis not present

## 2020-06-14 DIAGNOSIS — I10 Essential (primary) hypertension: Secondary | ICD-10-CM | POA: Diagnosis not present

## 2020-06-14 DIAGNOSIS — D649 Anemia, unspecified: Secondary | ICD-10-CM | POA: Insufficient documentation

## 2020-06-14 DIAGNOSIS — D696 Thrombocytopenia, unspecified: Secondary | ICD-10-CM | POA: Diagnosis present

## 2020-06-14 LAB — BASIC METABOLIC PANEL
Anion gap: 10 (ref 5–15)
BUN: 13 mg/dL (ref 8–23)
CO2: 22 mmol/L (ref 22–32)
Calcium: 8.5 mg/dL — ABNORMAL LOW (ref 8.9–10.3)
Chloride: 99 mmol/L (ref 98–111)
Creatinine, Ser: 0.77 mg/dL (ref 0.61–1.24)
GFR, Estimated: >60 mL/min (ref >60)
Glucose, Bld: 138 mg/dL — ABNORMAL HIGH (ref 70–99)
Potassium: 4.1 mmol/L (ref 3.5–5.1)
Sodium: 131 mmol/L — ABNORMAL LOW (ref 135–145)

## 2020-06-14 LAB — CBC WITH DIFFERENTIAL/PLATELET
Abs Immature Granulocytes: 0.21 10*3/uL — ABNORMAL HIGH (ref 0.00–0.07)
Basophils Absolute: 0.1 10*3/uL (ref 0.0–0.1)
Basophils Relative: 1 %
Eosinophils Absolute: 0.1 10*3/uL (ref 0.0–0.5)
Eosinophils Relative: 2 %
HCT: 29.9 % — ABNORMAL LOW (ref 39.0–52.0)
Hemoglobin: 9.7 g/dL — ABNORMAL LOW (ref 13.0–17.0)
Immature Granulocytes: 4 %
Lymphocytes Relative: 23 %
Lymphs Abs: 1.3 10*3/uL (ref 0.7–4.0)
MCH: 26.4 pg (ref 26.0–34.0)
MCHC: 32.4 g/dL (ref 30.0–36.0)
MCV: 81.3 fL (ref 80.0–100.0)
Monocytes Absolute: 0.5 10*3/uL (ref 0.1–1.0)
Monocytes Relative: 8 %
Neutro Abs: 3.5 10*3/uL (ref 1.7–7.7)
Neutrophils Relative %: 62 %
Platelets: 70 10*3/uL — ABNORMAL LOW (ref 150–400)
RBC: 3.68 MIL/uL — ABNORMAL LOW (ref 4.22–5.81)
RDW: 28 % — ABNORMAL HIGH (ref 11.5–15.5)
WBC: 5.6 10*3/uL (ref 4.0–10.5)
nRBC: 8.9 % — ABNORMAL HIGH (ref 0.0–0.2)

## 2020-06-14 LAB — TYPE AND SCREEN
ABO/RH(D): A POS
Antibody Screen: NEGATIVE

## 2020-06-14 NOTE — ED Triage Notes (Signed)
Patient sent by PCP, platelets found to be 26 today. Denies complaints. Hx salivary gland cancer.

## 2020-06-14 NOTE — ED Provider Notes (Signed)
[place] DEPT Provider Note   CSN: [id number] Arrival date & time: 06/14/20  1321     History Chief Complaint  Patient presents with  . Abnormal Lab    Geoffrey Wood is a 74 y.o. male.  Patient sent in by primary care office for concern for low platelets.  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  Patient on October 27 received a blood transfusion for anemia.  He is reported to have low platelets of 25,000 that is why he was sent in.  He has follow-up with hematology oncology here on November 16.  Patient without any specific complaints.  Nothing new or worse.  Patient without any bleeding problems.  Does not feel lightheaded or feeling like he is got a passout.        Past Medical History:  Diagnosis Date  . Adenomatous colon polyp   . Cataract   . Diabetes mellitus   . Emphysema   . Emphysema of lung ([place])   . Erectile dysfunction   . Hyperlipidemia   . Hypertension   . Peripheral vascular disease [facility])     Patient Active Problem List   Diagnosis Date Noted  . Coronary artery calcification 11/16/2018  . Pulmonary nodules 01/22/2014  . Personal history of adenomatous colonic polyps 12/24/2011  . HTN (hypertension) 04/21/2011  . Hyperlipidemia 04/21/2011    Past Surgical History:  Procedure Laterality Date  . ABDOMINAL AORTIC ANEURYSM REPAIR  2004  . COLONOSCOPY  multiple since 2004       Family History  Problem Relation Age of Onset  . Heart failure Mother   . Diabetes Mother   . Cancer Father        lung  . Colon cancer Neg Hx   . Esophageal cancer Neg Hx   . Liver cancer Neg Hx   . Pancreatic cancer Neg Hx   . Rectal cancer Neg Hx   . Stomach cancer Neg Hx     Social History   Tobacco Use  . Smoking status: Smoker, Current Status Unknown    Packs/day: 1.00    Years: 50.00    Pack years: 50.00    Types: Cigarettes    Last attempt to quit: 09/17/2013    Years since  quitting: 6.7  . Smokeless tobacco: Never Used  Vaping Use  . Vaping Use: Never used  Substance Use Topics  . Alcohol use: Yes    Comment: not even 1 drink a week  . Drug use: No    Home Medications Prior to Admission medications   Medication Sig Start Date End Date Taking? Authorizing Provider  acetaminophen (TYLENOL) 325 MG tablet Take 325 mg by mouth every 6 (six) hours as needed for mild pain.   Yes [provider]  amoxicillin-clavulanate (AUGMENTIN) 875-125 MG tablet Take 1 tablet by mouth 2 (two) times daily. 06/10/20  Yes [provider]  ANORO ELLIPTA 62.5-25 MCG/INH AEPB Take 1 puff by mouth daily. 08/20/17  Yes [provider]  carboxymethylcellulose (REFRESH PLUS) 0.5 % SOLN Place 1 drop into the right eye in the morning, at noon, in the evening, and at bedtime. 06/05/20 07/05/20 Yes [provider]  celecoxib (CELEBREX) 200 MG capsule Take 200 mg by mouth in the morning and at bedtime. 06/05/20  Yes [provider]  Cholecalciferol (VITAMIN D PO) Take 1,000 Units by mouth daily.    Yes [provider]  Cyanocobalamin (VITAMIN B 12 PO)  Take 500 mcg by mouth daily.    Yes [provider]  erythromycin ophthalmic ointment Place 1 application into the right eye at bedtime. 06/05/20  Yes [provider]  ezetimibe (ZETIA) 10 MG tablet Take 10 mg by mouth daily.    Yes [provider]  LIPITOR 80 MG tablet Take 40 tablets by mouth Daily.  11/04/11  Yes [provider]  Melatonin 10 MG TABS Take 10 mg by mouth at bedtime as needed (sleep).   Yes [provider]  niacin 500 MG tablet Take 500 mg by mouth 4 (four) times daily.    Yes [provider]  oxyCODONE (OXY IR/ROXICODONE) 5 MG immediate release tablet Take 5 mg by mouth 2 (two) times daily as needed for pain. 06/05/20  Yes [provider]  Saccharomyces boulardii (PROBIOTIC) 250 MG CAPS Take 250 mg by mouth daily.    Yes [provider]  SYNJARDY 12.12-998 MG TABS Take 1 tablet by mouth daily.  06/29/17  Yes [provider]  ONETOUCH VERIO test strip  07/12/18   [provider]  ramipril (ALTACE) 5 MG capsule Take 1 capsule (5 mg total) by mouth daily. 11/16/18   Nahser, Wonda Cheng, MD    Allergies    Patient has no known allergies.  Review of Systems   Review of Systems  Constitutional: Negative for chills and fever.  HENT: Negative for rhinorrhea and sore throat.   Eyes: Negative for visual disturbance.  Respiratory: Negative for cough and shortness of breath.   Cardiovascular: Negative for chest pain and leg swelling.  Gastrointestinal: Negative for abdominal pain, diarrhea, nausea and vomiting.  Genitourinary: Negative for dysuria.  Musculoskeletal: Negative for back pain and neck pain.  Skin: Negative for rash.  Neurological: Positive for facial asymmetry and weakness. Negative for dizziness, light-headedness and headaches.  Hematological: Does not bruise/bleed easily.  Psychiatric/Behavioral: Negative for confusion.    Physical Exam Updated Vital Signs BP 127/67   Pulse 66   Temp 98 F (36.7 C)   Resp 20   SpO2 96%   Physical Exam Vitals and nursing note reviewed.  Constitutional:      General: He is not in acute distress.    Appearance: Normal appearance. He is well-developed. He is not ill-appearing.  HENT:     Head: Normocephalic and atraumatic.  Eyes:     Extraocular Movements: Extraocular movements intact.     Conjunctiva/sclera: Conjunctivae normal.     Pupils: Pupils are equal, round, and reactive to light.  Cardiovascular:     Rate and Rhythm: Normal rate and regular rhythm.     Heart sounds: No murmur heard.   Pulmonary:     Effort: Pulmonary effort is normal. No respiratory distress.     Breath sounds: Normal breath sounds.  Abdominal:     Palpations: Abdomen is soft.     Tenderness: There is no abdominal tenderness.  Musculoskeletal:         General: No swelling. Normal range of motion.     Cervical back: Normal range of motion and neck supple.  Skin:    General: Skin is warm and dry.  Neurological:     Mental Status: He is alert and oriented to person, place, and time.     Cranial Nerves: Cranial nerve deficit present.     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     ED Results / Procedures / Treatments   Labs (all  labs ordered are listed, but only abnormal results are displayed) Labs Reviewed  CBC WITH DIFFERENTIAL/PLATELET - Abnormal; Notable for the following components:      Result Value   RBC 3.68 (*)    Hemoglobin 9.7 (*)    HCT 29.9 (*)    RDW 28.0 (*)    Platelets 70 (*)    nRBC 8.9 (*)    Abs Immature Granulocytes 0.21 (*)    All other components within normal limits  BASIC METABOLIC PANEL - Abnormal; Notable for the following components:   Sodium 131 (*)    Glucose, Bld 138 (*)    Calcium 8.5 (*)    All other components within normal limits  TYPE AND SCREEN    EKG None  Radiology No results found.  Procedures Procedures (including critical care time)  Medications Ordered in ED Medications - No data to display  ED Course  I have reviewed the triage vital signs and the nursing notes.  Pertinent labs & imaging results that were available during my care of the patient were reviewed by me and considered in my medical decision making (see chart for details).    MDM Rules/Calculators/A&P                          Lab work-up here patient platelet count is 70,000.  Hemoglobin 9.7.  No indications for platelet transfusion or packed red blood cell transfusion at this time.  But will need close follow-up.  BC differential without any significant abnormal findings.  Patient will keep their appointment with hematology oncology for November 16.  Follow back up with their primary care provider to have platelets followed closely.  No  indication for transfusion currently patient in no acute distress.   Final Clinical Impression(s) / ED Diagnoses Final diagnoses:  Anemia, unspecified type  Thrombocytopenia [facility])    Rx / DC Orders ED Discharge Orders    None       Fredia Sorrow, MD 06/14/20 1750

## 2020-06-14 NOTE — Discharge Instructions (Addendum)
Platelet count came in the 70,000 today.  Hemoglobin was 9.7.  No indications for blood transfusion at this time.  But close follow-up would be important.  Keep your appointment with hematology oncology scheduled for November 16.  Return for any new or worse symptoms or any bleeding problems.

## 2020-06-17 ENCOUNTER — Telehealth: Payer: Self-pay | Admitting: Hematology

## 2020-06-17 NOTE — Telephone Encounter (Signed)
I received a call from the pt's wife to r/s his hem appt w/Dr. Irene Limbo. I cld and spoke to Mrs. Hora and rescheduled Mr. Vegh to see Dr. Irene Limbo on 11/2 at [place] to arrive 20 minutes early.

## 2020-06-18 ENCOUNTER — Inpatient Hospital Stay: Payer: Medicare Other

## 2020-06-18 ENCOUNTER — Other Ambulatory Visit: Payer: Self-pay

## 2020-06-18 ENCOUNTER — Inpatient Hospital Stay: Payer: Medicare Other | Attending: Hematology | Admitting: Hematology

## 2020-06-18 VITALS — BP 116/66 | HR 72 | Temp 97.0°F | Resp 18 | Ht 72.0 in | Wt 160.6 lb

## 2020-06-18 DIAGNOSIS — C07 Malignant neoplasm of parotid gland: Secondary | ICD-10-CM | POA: Diagnosis not present

## 2020-06-18 DIAGNOSIS — D649 Anemia, unspecified: Secondary | ICD-10-CM

## 2020-06-18 DIAGNOSIS — D696 Thrombocytopenia, unspecified: Secondary | ICD-10-CM | POA: Diagnosis present

## 2020-06-18 LAB — CBC WITH DIFFERENTIAL/PLATELET
Abs Immature Granulocytes: 0.11 10*3/uL — ABNORMAL HIGH (ref 0.00–0.07)
Basophils Absolute: 0 10*3/uL (ref 0.0–0.1)
Basophils Relative: 1 %
Eosinophils Absolute: 0.1 10*3/uL (ref 0.0–0.5)
Eosinophils Relative: 3 %
HCT: 34.1 % — ABNORMAL LOW (ref 39.0–52.0)
Hemoglobin: 11 g/dL — ABNORMAL LOW (ref 13.0–17.0)
Immature Granulocytes: 2 %
Lymphocytes Relative: 33 %
Lymphs Abs: 1.8 10*3/uL (ref 0.7–4.0)
MCH: 25.9 pg — ABNORMAL LOW (ref 26.0–34.0)
MCHC: 32.3 g/dL (ref 30.0–36.0)
MCV: 80.2 fL (ref 80.0–100.0)
Monocytes Absolute: 0.4 10*3/uL (ref 0.1–1.0)
Monocytes Relative: 7 %
Neutro Abs: 3 10*3/uL (ref 1.7–7.7)
Neutrophils Relative %: 54 %
Platelets: 62 10*3/uL — ABNORMAL LOW (ref 150–400)
RBC: 4.25 MIL/uL (ref 4.22–5.81)
RDW: 27.6 % — ABNORMAL HIGH (ref 11.5–15.5)
WBC: 5.5 10*3/uL (ref 4.0–10.5)
nRBC: 6 % — ABNORMAL HIGH (ref 0.0–0.2)

## 2020-06-18 LAB — CMP (CANCER CENTER ONLY)
ALT: 23 U/L (ref 0–44)
AST: 17 U/L (ref 15–41)
Albumin: 3.3 g/dL — ABNORMAL LOW (ref 3.5–5.0)
Alkaline Phosphatase: 85 U/L (ref 38–126)
Anion gap: 7 (ref 5–15)
BUN: 16 mg/dL (ref 8–23)
CO2: 26 mmol/L (ref 22–32)
Calcium: 9.3 mg/dL (ref 8.9–10.3)
Chloride: 99 mmol/L (ref 98–111)
Creatinine: 0.78 mg/dL (ref 0.61–1.24)
GFR, Estimated: >60 mL/min (ref >60)
Glucose, Bld: 108 mg/dL — ABNORMAL HIGH (ref 70–99)
Potassium: 4.2 mmol/L (ref 3.5–5.1)
Sodium: 132 mmol/L — ABNORMAL LOW (ref 135–145)
Total Bilirubin: 1.3 mg/dL — ABNORMAL HIGH (ref 0.3–1.2)
Total Protein: 7.7 g/dL (ref 6.5–8.1)

## 2020-06-18 LAB — SAMPLE TO BLOOD BANK

## 2020-06-18 LAB — LACTATE DEHYDROGENASE: LDH: 293 U/L — ABNORMAL HIGH (ref 98–192)

## 2020-06-18 LAB — PLATELET BY CITRATE

## 2020-06-18 LAB — VITAMIN B12: Vitamin B-12: 1374 pg/mL — ABNORMAL HIGH (ref 180–914)

## 2020-06-18 LAB — IMMATURE PLATELET FRACTION: Immature Platelet Fraction: 35.9 % — ABNORMAL HIGH (ref 1.2–8.6)

## 2020-06-18 NOTE — Progress Notes (Signed)
HEMATOLOGY/ONCOLOGY CONSULTATION NOTE  Date of Service: 06/18/2020  Patient Care Team: Crist Infante, MD as PCP - General (Internal Medicine) Nahser, Wonda Cheng, MD as PCP - Cardiology (Cardiology)  CHIEF COMPLAINTS/PURPOSE OF CONSULTATION:  Anemia and Thrombocytopenia  HISTORY OF PRESENTING ILLNESS:   Geoffrey Wood is a wonderful 74 y.o. male who has been referred to [place] by Caprice Beaver, AG-NP for evaluation and management of anemia and thrombocytopenia. Pt is accompanied today by his wife. The pt reports that he is doing well overall.   The pt reports that he was not told of his thrombocytopenia prior to his Parotidectomy. Pt first noticed enlargement of his parotid gland in August of this year. 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 They plan on grafting skin over the previously excised area, then completing radiation therapy for positive margins. Pt was started on Celebrex post-op and has already discontinued. He was diagnosed with double pneumonia last week when increased shortness of breath was observed. Pt was given a Z Pack and Augmentin. He has been completing these medications as prescribed.   Prior to the Parotidectomy Dr. Joylene Draft lowered his Lipitor and Synjardy dosages by half. He denies any other medication changes prior to his surgery. Pt was previously told about a Vitamin B12 deficiency and was placed on a B12 supplement by Dr. Joylene Draft.   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   Most recent lab results (06/14/2020) of CBC is as follows: all values are WNL except for RBC at 3.68, Hgb at 9.7, HCT at 29.9, RDW at 28.0, PLT at 70.K, nRBC at 8.9, Abs Immature Granulocytes at 0.21K, Sodium at 131, Glucose at 138, Calcium at 8.5. 05/31/2020 Hgb at 11.0, PLT at 41K.  On review of systems, pt reports delicate skin, bruising and denies  bloody/black stools, hematuria, nose bleeds, gum bleeds and any other symptoms.   On PMHx the pt reports Diabetes, Emphysema, HLD, HTN, Peripheral vascular disease, Tongue cancer, Skin cancer. On Social Hx the pt reports that he was a previous 50+ year smoker. He drinks alcohol a few times per year. On Family Hx the pt reports no known bleeding or blood clotting disorders.   MEDICAL HISTORY:  Past Medical History:  Diagnosis Date  . Adenomatous colon polyp   . Cataract   . Diabetes mellitus   . Emphysema   . Emphysema of lung ([place])   . Erectile dysfunction   . Hyperlipidemia   . Hypertension   . Peripheral vascular disease ([place])     SURGICAL HISTORY: Past Surgical History:  Procedure Laterality Date  . ABDOMINAL AORTIC ANEURYSM REPAIR  2004  . COLONOSCOPY  multiple since 2004    SOCIAL HISTORY: Social History   Socioeconomic History  . Marital status: Married    Spouse name: Not on file  . Number of children: Not on file  . Years of education: Not on file  . Highest education level: Not on file  Occupational History  . Occupation: Scientist, clinical (histocompatibility and immunogenetics): Designer, television/film set  Tobacco Use  . Smoking status: Smoker, Current Status Unknown    Packs/day: 1.00    Years: 50.00    Pack years: 50.00    Types: Cigarettes    Last attempt to quit: 09/17/2013    Years since quitting: 6.7  . Smokeless tobacco: Never Used  Vaping  Use  . Vaping Use: Never used  Substance and Sexual Activity  . Alcohol use: Yes    Comment: not even 1 drink a week  . Drug use: No  . Sexual activity: Not on file  Other Topics Concern  . Not on file  Social History Narrative  . Not on file   Social Determinants of Health   Financial Resource Strain:   . Difficulty of Paying Living Expenses: Not on file  Food Insecurity:   . Worried About Charity fundraiser in the Last Year: Not on file  . Ran Out of Food in the Last Year: Not on file  Transportation Needs:   . Lack of  Transportation (Medical): Not on file  . Lack of Transportation (Non-Medical): Not on file  Physical Activity:   . Days of Exercise per Week: Not on file  . Minutes of Exercise per Session: Not on file  Stress:   . Feeling of Stress : Not on file  Social Connections:   . Frequency of Communication with Friends and Family: Not on file  . Frequency of Social Gatherings with Friends and Family: Not on file  . Attends Religious Services: Not on file  . Active Member of Clubs or Organizations: Not on file  . Attends Archivist Meetings: Not on file  . Marital Status: Not on file  Intimate Partner Violence:   . Fear of Current or Ex-Partner: Not on file  . Emotionally Abused: Not on file  . Physically Abused: Not on file  . Sexually Abused: Not on file    FAMILY HISTORY: Family History  Problem Relation Age of Onset  . Heart failure Mother   . Diabetes Mother   . Cancer Father        lung  . Colon cancer Neg Hx   . Esophageal cancer Neg Hx   . Liver cancer Neg Hx   . Pancreatic cancer Neg Hx   . Rectal cancer Neg Hx   . Stomach cancer Neg Hx     ALLERGIES:  has No Known Allergies.  MEDICATIONS:  Current Outpatient Medications  Medication Sig Dispense Refill  . acetaminophen (TYLENOL) 325 MG tablet Take 325 mg by mouth every 6 (six) hours as needed for mild pain.    Marland Kitchen amoxicillin-clavulanate (AUGMENTIN) 875-125 MG tablet Take 1 tablet by mouth 2 (two) times daily.    [name] 62.5-25 MCG/INH AEPB Take 1 puff by mouth daily.    . carboxymethylcellulose (REFRESH PLUS) 0.5 % SOLN Place 1 drop into the right eye in the morning, at noon, in the evening, and at bedtime.    . celecoxib (CELEBREX) 200 MG capsule Take 200 mg by mouth in the morning and at bedtime.    . Cholecalciferol (VITAMIN D PO) Take 1,000 Units by mouth daily.     . Cyanocobalamin (VITAMIN B 12 PO) Take 500 mcg by mouth daily.     Marland Kitchen erythromycin ophthalmic ointment Place 1 application into the  right eye at bedtime.    Marland Kitchen ezetimibe (ZETIA) 10 MG tablet Take 10 mg by mouth daily.     Marland Kitchen LIPITOR 80 MG tablet Take 40 tablets by mouth Daily.     . Melatonin 10 MG TABS Take 10 mg by mouth at bedtime as needed (sleep).    . niacin 500 MG tablet Take 500 mg by mouth 4 (four) times daily.     Glory Rosebush VERIO test strip     . oxyCODONE (  OXY IR/ROXICODONE) 5 MG immediate release tablet Take 5 mg by mouth 2 (two) times daily as needed for pain.    . ramipril (ALTACE) 5 MG capsule Take 1 capsule (5 mg total) by mouth daily. 90 capsule 3  . Saccharomyces boulardii (PROBIOTIC) 250 MG CAPS Take 250 mg by mouth daily.    Marland Kitchen SYNJARDY 12.12-998 MG TABS Take 1 tablet by mouth daily.      Current Facility-Administered Medications  Medication Dose Route Frequency Provider Last Rate Last Admin  . 0.9 %  sodium chloride infusion  500 mL Intravenous Once Gatha Mayer, MD        REVIEW OF SYSTEMS:    10 Point review of Systems was done is negative except as noted above.  PHYSICAL EXAMINATION: ECOG PERFORMANCE STATUS: 1 - Symptomatic but completely ambulatory  . Vitals:   06/18/20 1321  BP: 116/66  Pulse: 72  Resp: 18  Temp: (!) 97 F (36.1 C)  SpO2: 98%   Filed Weights   06/18/20 1321  Weight: 160 lb 9.6 oz (72.8 kg)   .Body mass index is 21.78 kg/m.  GENERAL:alert, in no acute distress and comfortable SKIN: no acute rashes, no significant lesions EYES: conjunctiva are pink and non-injected, sclera anicteric OROPHARYNX: MMM, no exudates, no oropharyngeal erythema or ulceration NECK: supple, no JVD LYMPH:  no palpable lymphadenopathy in the cervical, axillary or inguinal regions LUNGS: clear to auscultation b/l with normal respiratory effort HEART: regular rate & rhythm ABDOMEN:  normoactive bowel sounds , non tender, not distended. Extremity: no pedal edema PSYCH: alert & oriented x 3 with fluent speech NEURO: no focal motor/sensory deficits  LABORATORY DATA:  I have reviewed  the data as listed  . CBC Latest Ref Rng & Units 06/18/2020 06/14/2020 11/22/2007  WBC 4.0 - 10.5 K/uL 5.5 5.6 -  Hemoglobin 13.0 - 17.0 g/dL 11.0(L) 9.7(L) 13.5 POINT OF CARE RESULT  Hematocrit 39 - 52 % 34.1(L) 29.9(L) -  Platelets 150 - 400 K/uL 62(L) 70(L) -   . CBC    Component Value Date/Time   WBC 5.5 06/18/2020 1508   RBC 4.25 06/18/2020 1508   HGB 11.0 (L) 06/18/2020 1508   HCT 33.3 (L) 06/18/2020 1508   HCT 34.1 (L) 06/18/2020 1508   PLT 62 (L) 06/18/2020 1508   MCV 80.2 06/18/2020 1508   MCH 25.9 (L) 06/18/2020 1508   MCHC 32.3 06/18/2020 1508   RDW 27.6 (H) 06/18/2020 1508   LYMPHSABS 1.8 06/18/2020 1508   MONOABS 0.4 06/18/2020 1508   EOSABS 0.1 06/18/2020 1508   BASOSABS 0.0 06/18/2020 1508    . CMP Latest Ref Rng & Units 06/18/2020 06/14/2020 11/21/2007  Glucose 70 - 99 mg/dL 108(H) 138(H) 211(H)  BUN 8 - 23 mg/dL 16 13 8   Creatinine 0.61 - 1.24 mg/dL 0.78 0.77 0.83  Sodium 135 - 145 mmol/L 132(L) 131(L) 137  Potassium 3.5 - 5.1 mmol/L 4.2 4.1 4.1  Chloride 98 - 111 mmol/L 99 99 102  CO2 22 - 32 mmol/L 26 22 28   Calcium 8.9 - 10.3 mg/dL 9.3 8.5(L) 9.3  Total Protein 6.5 - 8.1 g/dL 7.7 - -  Total Bilirubin 0.3 - 1.2 mg/dL 1.3(H) - -  Alkaline Phos 38 - 126 U/L 85 - -  AST 15 - 41 U/L 17 - -  ALT 0 - 44 U/L 23 - -     RADIOGRAPHIC STUDIES: I have personally reviewed the radiological images as listed and agreed with the findings in the  report. VAS [place] LOWER EXTREMITY VENOUS (DVT)  Result Date: 06/10/2020  Lower Venous DVTStudy Indications: Swelling.  Risk Factors: Cancer Right parotid cancer. Comparison Study: No prior studies Performing Technologist: Darlin Coco  Examination Guidelines: A complete evaluation includes B-mode imaging, spectral Doppler, color Doppler, and power Doppler as needed of all accessible portions of each vessel. Bilateral testing is considered an integral part of a complete examination. Limited examinations for reoccurring indications  may be performed as noted. The reflux portion of the exam is performed with the patient in reverse Trendelenburg.  +---------+---------------+---------+-----------+----------+--------------+ RIGHT    CompressibilityPhasicitySpontaneityPropertiesThrombus Aging +---------+---------------+---------+-----------+----------+--------------+ CFV      Full           Yes      Yes                                 +---------+---------------+---------+-----------+----------+--------------+ SFJ      Full                                                        +---------+---------------+---------+-----------+----------+--------------+ FV Prox  Full                                                        +---------+---------------+---------+-----------+----------+--------------+ FV Mid   Full                                                        +---------+---------------+---------+-----------+----------+--------------+ FV DistalFull                                                        +---------+---------------+---------+-----------+----------+--------------+ PFV      Full                                                        +---------+---------------+---------+-----------+----------+--------------+ POP      Full           Yes      Yes                                 +---------+---------------+---------+-----------+----------+--------------+ PTV      Full                                                        +---------+---------------+---------+-----------+----------+--------------+ PERO     Full                                                        +---------+---------------+---------+-----------+----------+--------------+   +---------+---------------+---------+-----------+----------+--------------+  LEFT     CompressibilityPhasicitySpontaneityPropertiesThrombus Aging +---------+---------------+---------+-----------+----------+--------------+ CFV       Full           Yes      Yes                                 +---------+---------------+---------+-----------+----------+--------------+ SFJ      Full                                                        +---------+---------------+---------+-----------+----------+--------------+ FV Prox  Full                                                        +---------+---------------+---------+-----------+----------+--------------+ FV Mid   Full                                                        +---------+---------------+---------+-----------+----------+--------------+ FV DistalFull                                                        +---------+---------------+---------+-----------+----------+--------------+ PFV      Full                                                        +---------+---------------+---------+-----------+----------+--------------+ POP      Full           Yes      Yes                                 +---------+---------------+---------+-----------+----------+--------------+ PTV      Full                                                        +---------+---------------+---------+-----------+----------+--------------+ PERO     Full                                                        +---------+---------------+---------+-----------+----------+--------------+     Summary: RIGHT: - There is no evidence of deep vein thrombosis in the lower extremity.  - No cystic structure found in the popliteal fossa.  LEFT: - There is no evidence of deep vein thrombosis in the lower extremity.  - No  cystic structure found in the popliteal fossa.  *See table(s) above for measurements and observations. Electronically signed by Ruta Hinds MD on 06/10/2020 at 6:23:06 PM.    Final     ASSESSMENT & PLAN:   1. Anemia- normocytic - likely blood loss from surgery.  2. Thrombocytopenia plt 41k-- recevied PLT prior to surgery  3. Right Parotid  Carcinoma: -Radiation therapy is set for positive margins  -Will defer management to ENT and RadOnc    PLAN: -Discussed patient's most recent labs from 06/14/2020, dropping Hgb and PLT after surgery.  -Advised pt that anemia can be caused by nutritional deficiencies. Pt could have had difficulty eating well with Parotid gland mass. 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  -Advised pt that when PLT >100K they behave normally. PLT <20K increases the risk of spontaneous bleeding. -Advised pt that a BM Bx could be used for a more definitive diagnosis, but will hold off at this time due to other explanations.  -Advised pt that forearm bruising could be cased by Senile Purpura. Recommend pt hydrate skin well and wear compression sleeves. -Recommend pt f/u with Dr. Nicolette Bang as scheduled. -Will get labs today -Will see back in 1 week via phone   FOLLOW UP: Labs today Phone visit in 1 week  . Orders Placed This Encounter  Procedures  . CBC with Differential/Platelet    Standing Status:   Future    Number of Occurrences:   1    Standing Expiration Date:   06/18/2021  . CMP ([place] only)    Standing Status:   Future    Number of Occurrences:   1    Standing Expiration Date:   06/18/2021  . Ferritin    Standing Status:   Future    Number of Occurrences:   1    Standing Expiration Date:   06/18/2021  . Iron and TIBC    Standing Status:   Future    Number of Occurrences:   1    Standing Expiration Date:   06/18/2021  . Vitamin B12    Standing Status:   Future    Number of Occurrences:   1    Standing Expiration Date:   06/18/2021  . Folate RBC    Standing Status:   Future    Number of Occurrences:   1    Standing Expiration Date:   06/18/2021  . Copper, serum    Standing Status:   Future    Number of Occurrences:   1    Standing Expiration Date:   06/18/2021  . Platelet by Citrate    Standing Status:    Future    Number of Occurrences:   1    Standing Expiration Date:   06/18/2021  . Immature Platelet Fraction    Standing Status:   Future    Number of Occurrences:   1    Standing Expiration Date:   06/18/2021  . Lactate dehydrogenase    Standing Status:   Future    Number of Occurrences:   1    Standing Expiration Date:   06/18/2021  . Haptoglobin    Standing Status:   Future    Number of Occurrences:   1    Standing Expiration Date:   06/18/2021  . Sample to Blood Bank    Standing Status:   Future    Number of Occurrences:   1  Standing Expiration Date:   06/18/2021     All of the patients questions were answered with apparent satisfaction. The patient knows to call the clinic with any problems, questions or concerns.  I spent 35 mins counseling the patient face to face. The total time spent in the appointment was 45 minutes and more than 50% was on counseling and direct patient cares.    Sullivan Lone MD [place] AAHIVMS [facility] Hematology/Oncology Physician [facility]  (Office):       [phone] (Work cell):  [phone] (Fax):           [phone]  06/18/2020 4:53 PM  I, Yevette Edwards, am acting as a scribe for Dr. Sullivan Lone.   .I have reviewed the above documentation for accuracy and completeness, and I agree with the above. Brunetta Genera MD

## 2020-06-19 ENCOUNTER — Encounter: Payer: Medicare Other | Admitting: Hematology

## 2020-06-19 LAB — IRON AND TIBC
Iron: 52 ug/dL (ref 42–163)
Saturation Ratios: 21 % (ref 20–55)
TIBC: 248 ug/dL (ref 202–409)
UIBC: 196 ug/dL (ref 117–376)

## 2020-06-19 LAB — HAPTOGLOBIN: Haptoglobin: 147 mg/dL (ref 34–355)

## 2020-06-19 LAB — FERRITIN: Ferritin: 149 ng/mL (ref 24–336)

## 2020-06-19 LAB — FOLATE RBC
Folate, Hemolysate: 544 ng/mL
Folate, RBC: 1634 ng/mL (ref >498)
Hematocrit: 33.3 % — ABNORMAL LOW (ref 37.5–51.0)

## 2020-06-20 LAB — COPPER, SERUM: Copper: 161 ug/dL — ABNORMAL HIGH (ref 69–132)

## 2020-06-21 ENCOUNTER — Telehealth: Payer: Self-pay | Admitting: Hematology

## 2020-06-21 NOTE — Telephone Encounter (Signed)
Contacted patient to verify phone visit for pre reg °

## 2020-06-21 NOTE — Progress Notes (Signed)
HEMATOLOGY/ONCOLOGY CLINIC NOTE  Date of Service: 06/24/2020  Patient Care Team: Geoffrey Infante, MD as PCP - General (Internal Medicine) Nahser, Wonda Cheng, MD as PCP - Cardiology (Cardiology)  CHIEF COMPLAINTS/PURPOSE OF CONSULTATION:  Anemia and Thrombocytopenia  HISTORY OF PRESENTING ILLNESS:  Geoffrey Wood is a wonderful 74 y.o. male who has been referred to [place] by Geoffrey Wood, AG-NP for evaluation and management of anemia and thrombocytopenia. Pt is accompanied today by his wife. The pt reports that he is doing well overall.   The pt reports that he was not told of his thrombocytopenia prior to his Parotidectomy. Pt first noticed enlargement of his parotid gland in August of this year. 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 They plan on grafting skin over the previously excised area, then completing radiation therapy for positive margins. Pt was started on Celebrex post-op and has already discontinued. He was diagnosed with double pneumonia last week when increased shortness of breath was observed. Pt was given a Z Pack and Augmentin. He has been completing these medications as prescribed.   Prior to the Parotidectomy Geoffrey Wood lowered his Lipitor and Synjardy dosages by half. He denies any other medication changes prior to his surgery. Pt was previously told about a Vitamin B12 deficiency and was placed on a B12 supplement by Geoffrey Wood.   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   Most recent lab results (06/14/2020) of CBC is as follows: all values are WNL except for RBC at 3.68, Hgb at 9.7, HCT at 29.9, RDW at 28.0, PLT at 70.K, nRBC at 8.9, Abs Immature Granulocytes at 0.21K, Sodium at 131, Glucose at 138, Calcium at 8.5. 05/31/2020 Hgb at 11.0, PLT at 41K.  On review of systems, pt reports delicate skin, bruising and denies  bloody/black stools, hematuria, nose bleeds, gum bleeds and any other symptoms.   On PMHx the pt reports Diabetes, Emphysema, HLD, HTN, Peripheral vascular disease, Tongue cancer, Skin cancer. On Social Hx the pt reports that he was a previous 50+ year smoker. He drinks alcohol a few times per year. On Family Hx the pt reports no known bleeding or blood clotting disorders.   INTERVAL HISTORY: I connected with  Geoffrey Wood on 06/24/20 by telephone and verified that I am speaking with the correct person using two identifiers.   I discussed the limitations of evaluation and management by telemedicine. The patient expressed understanding and agreed to proceed.  Other persons participating in the visit and their role in the encounter:       -Geoffrey Wood, Medical [employer]      -Geoffrey Wood, Pt's wife  Patient's location: Home Provider's location: [place] [facility] [place] is a wonderful 74 y.o. male who is here for evaluation and management of anemia and thrombocytopenia. The patient's last visit with [place] was on 06/18/2020. The pt reports that he is doing well overall.  The pt reports that he is scheduled for a Flap procedure on 11/12. He has been well in the interim and denies any new concerns.   Lab results (06/18/2020) of CBC w/diff and CMP is as follows: all values are WNL except for Hgb at 11.0, HCT at 34.1, MCH at 25.9, RDW at 27.6, PLT at 62K, nRBC at 6.0, Abs Immature Granulocytes at 0.11K, Sodium at 132, Glucose at  108, Albumin at 3.3, Total Bilrubin at 1.3. 06/18/2020 LDH at 293 06/18/2020 Ferritin at 149 06/18/2020 Copper at 161 06/18/2020 Haptoglobin at 147 06/18/2020 Vitamin B12 at 1374 06/18/2020 Immature PLT Fract at 35.9 06/18/2020 Iron Panel shows all values are WNL 06/18/2020 Folate Hemolysate at 544.0, Folate RBC at 1634   On review of systems, pt denies any other symptoms.   MEDICAL HISTORY:  Past Medical History:  Diagnosis Date  . Adenomatous  colon polyp   . Cataract   . Diabetes mellitus   . Emphysema   . Emphysema of lung ([place])   . Erectile dysfunction   . Hyperlipidemia   . Hypertension   . Peripheral vascular disease ([place])     SURGICAL HISTORY: Past Surgical History:  Procedure Laterality Date  . ABDOMINAL AORTIC ANEURYSM REPAIR  2004  . COLONOSCOPY  multiple since 2004    SOCIAL HISTORY: Social History   Socioeconomic History  . Marital status: Married    Spouse name: Not on file  . Number of children: Not on file  . Years of education: Not on file  . Highest education level: Not on file  Occupational History  . Occupation: Scientist, clinical (histocompatibility and immunogenetics): Designer, television/film set  Tobacco Use  . Smoking status: Smoker, Current Status Unknown    Packs/day: 1.00    Years: 50.00    Pack years: 50.00    Types: Cigarettes    Last attempt to quit: 09/17/2013    Years since quitting: 6.7  . Smokeless tobacco: Never Used  Vaping Use  . Vaping Use: Never used  Substance and Sexual Activity  . Alcohol use: Yes    Comment: not even 1 drink a week  . Drug use: No  . Sexual activity: Not on file  Other Topics Concern  . Not on file  Social History Narrative  . Not on file   Social Determinants of Health   Financial Resource Strain:   . Difficulty of Paying Living Expenses: Not on file  Food Insecurity:   . Worried About Charity fundraiser in the Last Year: Not on file  . Ran Out of Food in the Last Year: Not on file  Transportation Needs:   . Lack of Transportation (Medical): Not on file  . Lack of Transportation (Non-Medical): Not on file  Physical Activity:   . Days of Exercise per Week: Not on file  . Minutes of Exercise per Session: Not on file  Stress:   . Feeling of Stress : Not on file  Social Connections:   . Frequency of Communication with Friends and Family: Not on file  . Frequency of Social Gatherings with Friends and Family: Not on file  . Attends Religious Services: Not on file  .  Active Member of Clubs or Organizations: Not on file  . Attends Archivist Meetings: Not on file  . Marital Status: Not on file  Intimate Partner Violence:   . Fear of Current or Ex-Partner: Not on file  . Emotionally Abused: Not on file  . Physically Abused: Not on file  . Sexually Abused: Not on file    FAMILY HISTORY: Family History  Problem Relation Age of Onset  . Heart failure Mother   . Diabetes Mother   . Cancer Father        lung  . Colon cancer Neg Hx   . Esophageal cancer Neg Hx   . Liver cancer Neg Hx   . Pancreatic cancer Neg Hx   .  Rectal cancer Neg Hx   . Stomach cancer Neg Hx     ALLERGIES:  has No Known Allergies.  MEDICATIONS:  Current Outpatient Medications  Medication Sig Dispense Refill  . acetaminophen (TYLENOL) 325 MG tablet Take 325 mg by mouth every 6 (six) hours as needed for mild pain.    Marland Kitchen amoxicillin-clavulanate (AUGMENTIN) 875-125 MG tablet Take 1 tablet by mouth 2 (two) times daily.    [name] 62.5-25 MCG/INH AEPB Take 1 puff by mouth daily.    . Ascorbic Acid (VITAMIN C) 500 MG CAPS Take 1 capsule by mouth 2 (two) times daily.    . carboxymethylcellulose (REFRESH PLUS) 0.5 % SOLN Place 1 drop into the right eye in the morning, at noon, in the evening, and at bedtime.    . celecoxib (CELEBREX) 200 MG capsule Take 200 mg by mouth in the morning and at bedtime. (Patient not taking: Reported on 06/18/2020)    . Cholecalciferol (VITAMIN D PO) Take 800 Units by mouth daily.     . Cyanocobalamin (VITAMIN B 12 PO) Take 500 mcg by mouth daily.     Marland Kitchen erythromycin ophthalmic ointment Place 1 application into the right eye at bedtime.    Marland Kitchen ezetimibe (ZETIA) 10 MG tablet Take 10 mg by mouth daily.     . Ferrous Fumarate (IRON) 18 MG TBCR Take 1 tablet by mouth daily.    Marland Kitchen LIPITOR 80 MG tablet Take 40 mg by mouth Daily.     . Melatonin 10 MG TABS Take 10 mg by mouth at bedtime as needed (sleep).    . niacin 500 MG tablet Take 500 mg by  mouth 4 (four) times daily.     Glory Rosebush VERIO test strip     . oxyCODONE (OXY IR/ROXICODONE) 5 MG immediate release tablet Take 5 mg by mouth 2 (two) times daily as needed for pain.    . ramipril (ALTACE) 5 MG capsule Take 1 capsule (5 mg total) by mouth daily. (Patient not taking: Reported on 06/18/2020) 90 capsule 3  . Saccharomyces boulardii (PROBIOTIC) 250 MG CAPS Take 250 mg by mouth daily.    Marland Kitchen SYNJARDY 12.12-998 MG TABS Take 1 tablet by mouth daily.      Current Facility-Administered Medications  Medication Dose Route Frequency Provider Last Rate Last Admin  . 0.9 %  sodium chloride infusion  500 mL Intravenous Once Gatha Mayer, MD        REVIEW OF SYSTEMS:   A 10+ POINT REVIEW OF SYSTEMS WAS OBTAINED including neurology, dermatology, psychiatry, cardiac, respiratory, lymph, extremities, GI, GU, Musculoskeletal, constitutional, breasts, reproductive, HEENT.  All pertinent positives are noted in the HPI.  All others are negative.   PHYSICAL EXAMINATION: ECOG PERFORMANCE STATUS: 1 - Symptomatic but completely ambulatory  . There were no vitals filed for this visit. There were no vitals filed for this visit. .There is no height or weight on file to calculate BMI.  Telehealth visit 06/24/2020  LABORATORY DATA:  I have reviewed the data as listed  . CBC Latest Ref Rng & Units 06/18/2020 06/18/2020 06/14/2020  WBC 4.0 - 10.5 K/uL 5.5 - 5.6  Hemoglobin 13.0 - 17.0 g/dL 11.0(L) - 9.7(L)  Hematocrit 37.5 - 51.0 % 34.1(L) 33.3(L) 29.9(L)  Platelets 150 - 400 K/uL 62(L) - 70(L)    . CMP Latest Ref Rng & Units 06/18/2020 06/14/2020 11/21/2007  Glucose 70 - 99 mg/dL 108(H) 138(H) 211(H)  BUN 8 - 23 mg/dL 16 13 8  Creatinine 0.61 - 1.24 mg/dL 0.78 0.77 0.83  Sodium 135 - 145 mmol/L 132(L) 131(L) 137  Potassium 3.5 - 5.1 mmol/L 4.2 4.1 4.1  Chloride 98 - 111 mmol/L 99 99 102  CO2 22 - 32 mmol/L 26 22 28   Calcium 8.9 - 10.3 mg/dL 9.3 8.5(L) 9.3  Total Protein 6.5 - 8.1 g/dL 7.7 -  -  Total Bilirubin 0.3 - 1.2 mg/dL 1.3(H) - -  Alkaline Phos 38 - 126 U/L 85 - -  AST 15 - 41 U/L 17 - -  ALT 0 - 44 U/L 23 - -     RADIOGRAPHIC STUDIES: I have personally reviewed the radiological images as listed and agreed with the findings in the report. VAS [place] LOWER EXTREMITY VENOUS (DVT)  Result Date: 06/10/2020  Lower Venous DVTStudy Indications: Swelling.  Risk Factors: Cancer Right parotid cancer. Comparison Study: No prior studies Performing Technologist: Darlin Coco  Examination Guidelines: A complete evaluation includes B-mode imaging, spectral Doppler, color Doppler, and power Doppler as needed of all accessible portions of each vessel. Bilateral testing is considered an integral part of a complete examination. Limited examinations for reoccurring indications may be performed as noted. The reflux portion of the exam is performed with the patient in reverse Trendelenburg.  +---------+---------------+---------+-----------+----------+--------------+ RIGHT    CompressibilityPhasicitySpontaneityPropertiesThrombus Aging +---------+---------------+---------+-----------+----------+--------------+ CFV      Full           Yes      Yes                                 +---------+---------------+---------+-----------+----------+--------------+ SFJ      Full                                                        +---------+---------------+---------+-----------+----------+--------------+ FV Prox  Full                                                        +---------+---------------+---------+-----------+----------+--------------+ FV Mid   Full                                                        +---------+---------------+---------+-----------+----------+--------------+ FV DistalFull                                                        +---------+---------------+---------+-----------+----------+--------------+ PFV      Full                                                         +---------+---------------+---------+-----------+----------+--------------+ POP      Full  Yes      Yes                                 +---------+---------------+---------+-----------+----------+--------------+ PTV      Full                                                        +---------+---------------+---------+-----------+----------+--------------+ PERO     Full                                                        +---------+---------------+---------+-----------+----------+--------------+   +---------+---------------+---------+-----------+----------+--------------+ LEFT     CompressibilityPhasicitySpontaneityPropertiesThrombus Aging +---------+---------------+---------+-----------+----------+--------------+ CFV      Full           Yes      Yes                                 +---------+---------------+---------+-----------+----------+--------------+ SFJ      Full                                                        +---------+---------------+---------+-----------+----------+--------------+ FV Prox  Full                                                        +---------+---------------+---------+-----------+----------+--------------+ FV Mid   Full                                                        +---------+---------------+---------+-----------+----------+--------------+ FV DistalFull                                                        +---------+---------------+---------+-----------+----------+--------------+ PFV      Full                                                        +---------+---------------+---------+-----------+----------+--------------+ POP      Full           Yes      Yes                                 +---------+---------------+---------+-----------+----------+--------------+ PTV  Full                                                         +---------+---------------+---------+-----------+----------+--------------+ PERO     Full                                                        +---------+---------------+---------+-----------+----------+--------------+     Summary: RIGHT: - There is no evidence of deep vein thrombosis in the lower extremity.  - No cystic structure found in the popliteal fossa.  LEFT: - There is no evidence of deep vein thrombosis in the lower extremity.  - No cystic structure found in the popliteal fossa.  *See table(s) above for measurements and observations. Electronically signed by Ruta Hinds MD on 06/10/2020 at 6:23:06 PM.    Final     ASSESSMENT & PLAN:   1. Anemia  2. Thromobcytopenia  3. Right Parotid Carcinoma: -Radiation therapy is set for positive margins  -Will defer management to ENT and RadOnc    PLAN: -Discussed pt labwork, 06/18/2020; resolving anemia, stable thrombocytopenia, WBC are nml, Ferritin & Iron Panel look good, Folate & Haptoglobin is WNL; Immature PLT Fract, LDH, Copper, & B12 are elevated.  -Advised pt that his LDH is elevated, but Haptoglobin is nml, which suggests against hemolysis. Anemia likely from recent surgery.  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 -Advised pt that if ITP does not resolve in three months it is likely that it will be life-long.  -Advised pt that ITP is treated if PLT <50K with increased risk of bleeding or <30K with no increased risk of bleeding.  -Advised pt that his PLT are currently okay for surgery and should not need intervention at this time, unless the surgeon suggests this.  -Advised pt that PLT can be given prior to, during, or after surgery in a supportive fashion if necessary.  -Will see back in 6 weeks with labs   FOLLOW UP: RTC with Dr Irene Limbo with labs in 6 weeks   The total time spent in the appt  was 20 minutes and more than 50% was on counseling and direct patient cares.  All of the patient's questions were answered with apparent satisfaction. The patient knows to call the clinic with any problems, questions or concerns.    Sullivan Lone MD [place] AAHIVMS [facility] Hematology/Oncology Physician [facility]  (Office):       [phone] (Work cell):  [phone] (Fax):           [phone]  06/24/2020 9:35 AM  I, Geoffrey Wood, am acting as a scribe for Dr. Sullivan Lone.   .I have reviewed the above documentation for accuracy and completeness, and I agree with the above. Brunetta Genera MD

## 2020-06-21 NOTE — Telephone Encounter (Signed)
Scheduled per 11/02 los, patient has been called and notified.

## 2020-06-24 ENCOUNTER — Inpatient Hospital Stay (HOSPITAL_BASED_OUTPATIENT_CLINIC_OR_DEPARTMENT_OTHER): Payer: Medicare Other | Admitting: Hematology

## 2020-06-24 DIAGNOSIS — D649 Anemia, unspecified: Secondary | ICD-10-CM

## 2020-06-24 DIAGNOSIS — I2510 Atherosclerotic heart disease of native coronary artery without angina pectoris: Secondary | ICD-10-CM | POA: Diagnosis not present

## 2020-06-24 DIAGNOSIS — I2584 Coronary atherosclerosis due to calcified coronary lesion: Secondary | ICD-10-CM | POA: Diagnosis not present

## 2020-06-24 DIAGNOSIS — D693 Immune thrombocytopenic purpura: Secondary | ICD-10-CM

## 2020-06-25 DIAGNOSIS — H02152 Paralytic ectropion of right lower eyelid: Secondary | ICD-10-CM | POA: Insufficient documentation

## 2020-06-25 DIAGNOSIS — Z9889 Other specified postprocedural states: Secondary | ICD-10-CM | POA: Insufficient documentation

## 2020-06-25 DIAGNOSIS — H02231 Paralytic lagophthalmos right upper eyelid: Secondary | ICD-10-CM | POA: Insufficient documentation

## 2020-06-25 DIAGNOSIS — C029 Malignant neoplasm of tongue, unspecified: Secondary | ICD-10-CM | POA: Insufficient documentation

## 2020-07-02 ENCOUNTER — Encounter: Payer: Medicare Other | Admitting: Hematology

## 2020-07-04 NOTE — Progress Notes (Signed)
Oncology Nurse Navigator Documentation  Placed introductory call to new referral patient Leigh Kaeding  Introduced myself as the H&N oncology nurse navigator that works with [name] to whom he has been referred by Dr. Nicolette Bang. He confirmed understanding of referral.  Briefly explained my role as his navigator, provided my contact information.   Confirmed understanding of upcoming appts via MyChart   I explained the purpose of a dental evaluation prior to starting RT, and he is scheduled to see Dr. Benson Norway on 07/09/20.  I encouraged him to call with questions/concerns as he moves forward with appts and procedures.    He verbalized understanding of information provided, expressed appreciation for my call.   Navigator Initial Assessment  Employment Status: retired  Currently on [employer] / STD:  Living Situation: He lives with his wife.   Support System: wife  PCP: Dr. Joylene Draft  PCD:  Financial Concerns:no  Transportation Needs: no  Sensory Deficits: slightly HOH  Language Barriers/Interpreter Needed:  no  Ambulation Needs: no  DME Used in Home: no  Psychosocial Needs:  no  Concerns/Needs Understanding Cancer:  addressed/answered by navigator to best of ability  Self-Expressed Needs: no   [name] RN, BSN, OCN Head & Neck Oncology Nurse [place] [facility] Phone # [phone]  Fax # [phone]

## 2020-07-05 ENCOUNTER — Other Ambulatory Visit: Payer: Self-pay

## 2020-07-05 ENCOUNTER — Telehealth: Payer: Medicare Other

## 2020-07-05 ENCOUNTER — Encounter: Payer: Self-pay | Admitting: Radiation Oncology

## 2020-07-05 ENCOUNTER — Telehealth: Payer: Self-pay | Admitting: Radiation Oncology

## 2020-07-05 ENCOUNTER — Ambulatory Visit
Admission: RE | Admit: 2020-07-05 | Discharge: 2020-07-05 | Disposition: A | Discharge status: Home / Self Care | Payer: Medicare Other | Source: Ambulatory Visit | Attending: Radiation Oncology | Admitting: Radiation Oncology

## 2020-07-05 DIAGNOSIS — C07 Malignant neoplasm of parotid gland: Secondary | ICD-10-CM

## 2020-07-05 DIAGNOSIS — R5381 Other malaise: Secondary | ICD-10-CM

## 2020-07-05 DIAGNOSIS — Z1329 Encounter for screening for other suspected endocrine disorder: Secondary | ICD-10-CM

## 2020-07-05 NOTE — Progress Notes (Unsigned)
TSH 

## 2020-07-05 NOTE — Progress Notes (Signed)
Radiation Oncology         [phone]) [phone] ________________________________  Initial Outpatient Consultation by MyChart video, per patient and family preference, to reduce potential exposures during the Covid pandemic  Name: Geoffrey Wood MRN: [id number]  Date: 07/05/2020  DOB: [birth year]  IO:[id number], Elta Gu[place], MD  Francina Ames, MD   REFERRING PHYSICIAN: Francina Ames, MD  DIAGNOSIS:    ICD-10-CM   1. 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 male who presented to Dr. Lucia Gaskins on 05/02/2020 with a developing right-sided parotid mass.  This was in the setting of a previous history of skin cancer on the right side of his scalp that was removed surgically 3 years ago.  He also had a history of a T1 tongue cancer, left side, that was removed surgically 9 years ago. Fine needle aspiration was performed at that time of the right parotid and revealed malignant cells consistent with non-small cell carcinoma.   Pertinent imaging thus far includes PET scan performed on 05/15/2020 revealing two round lesions associated with the right parotid gland with intense metabolic activity, consistent with given history of parotid gland neoplasm. There was also mild metabolic activity associated with subcarinal and left hilar lymph nodes, however similar to comparison PET scan and favored to be reactive adenopathy related to COPD. There was no evidence of cervical metastatic adenopathy, nor was there any evidence of distant metastatic disease.   CT scan of neck on 05/16/2020 showed a 3.6 cm right parotid gland mass that involved both the  superficial and deep lobes, likely reflecting a primary parotid neoplasm. Portions of the mass extended in close proximity to the auriculotemporal nerve. Additionally, portions of the mass extended in close proximity to the right stylomastoid foramen. Finally, there were two additional nodular soft tissue foci along the posteroinferior aspect of the right parotid gland that measured up to 1.4 cm. There were no pathologically enlarged or suspicious lymph nodes identified elsewhere within the neck.  Right parotidectomy on 06/03/2020 revealed poorly differentiated carcinoma that measured up to 6 cm in greatest dimension. Lymphovascular invasion was present.  No mention of positive or negative perineural invasion.  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 Peripheral and deep linked margin of the specimen was positive for tumor. Right level 2A lymph nodes (14), right level 2B lymph nodes (9), and tissue in glenoid fossa were all negative for malignancy.     Nutrition Status Yes No Comments  Weight changes? []  [x]  No major changes recently, but wife does report over the past 2 years patient has lost ~15 pounds without trying   Swallowing concerns? [x]  []  Occasionally feels like foods/medications get stuck about halfway down  PEG? []  [x]     Referrals Yes No Comments  Social Work? []  [x]    Dentistry? []  [x]    Swallowing therapy? [x]  []    Nutrition? [x]  []    Med/Onc? [x]  []     Safety Issues Yes No Comments  Prior radiation? []  [x]    Pacemaker/ICD? []  [x]    Possible current pregnancy? []  [  x] N/A  Is the patient on methotrexate? $RemoveBeforeDE'[]'WEUshAZcqgzKzLi$'[x]'$     Tobacco/Marijuana/Snuff/ETOH use: Patient is a former smoker--quit in 2015 (was a 1 pack/day for ~50 years); Occasionally drinks alcohol; Denies any illicit drug use   Past/Anticipated interventions by otolaryngology, if any:  06/03/2020 Dr. Francina Ames -Wide excision  of right preauricular cheek skin, 6 x 3 cm -Right parotidectomy with facial nerve dissection, ultimately requiring sacrifice of the upper division -Right selective neck dissection, levels 2-3 -Placement of wound-vac negative pressure device  05/02/2020 Dr. Melony Overly Fine needle aspiration of right parotid mass   Past/Anticipated interventions by medical oncology, if any:  Seen by Dr. Sullivan Lone for: anemia and thrombocytopenia   Current Complaints / other details:   Prior to patient's surgery, he lived a very active lifestyle. He has received both Pfizer vaccines as well as booster  Will see ENT PA next week for possible bolster removal; 11/30 appt w/ Dr Nicolette Bang anticipated.  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  He cannot use right hearing aid now due to surgical dressing/bolster.  Right facial droop present since surgery.  He has a gold weight in his right eyelid.  PREVIOUS RADIATION THERAPY: No  PAST MEDICAL HISTORY:  has a past medical history of Adenomatous colon polyp, Cataract, Diabetes mellitus, Emphysema, Emphysema of lung ([place]), Erectile dysfunction, Hyperlipidemia, Hypertension, and Peripheral vascular disease ([place]).    PAST SURGICAL HISTORY: Past Surgical History:  Procedure Laterality Date  . ABDOMINAL AORTIC ANEURYSM REPAIR  2004  . COLONOSCOPY  multiple since 2004    FAMILY HISTORY: family history includes Cancer in his father; Diabetes in his mother; Heart failure in his mother.  SOCIAL HISTORY:  reports that he quit smoking about 6 years ago. His smoking use included cigarettes. He has a 50.00 pack-year smoking history. He has never used smokeless tobacco. He reports current alcohol use. He reports that he does not use drugs.  ALLERGIES: Patient has no known allergies.  MEDICATIONS:  Current Outpatient Medications  Medication Sig Dispense Refill  . acetaminophen (TYLENOL) 325 MG tablet Take 325 mg by  mouth every 6 (six) hours as needed for mild pain.    [name] 62.5-25 MCG/INH AEPB Take 1 puff by mouth daily.    . Ascorbic Acid (VITAMIN C) 500 MG CAPS Take 1 capsule by mouth 2 (two) times daily.    . carboxymethylcellulose (REFRESH PLUS) 0.5 % SOLN Place 1 drop into the right eye in the morning, at noon, in the evening, and at bedtime.    . Cholecalciferol (VITAMIN D PO) Take 800 Units by mouth daily.     . Cyanocobalamin (VITAMIN B 12 PO) Take 500 mcg by mouth daily.     . ERGOCALCIFEROL PO Take 800 Units by mouth daily.    Marland Kitchen erythromycin ophthalmic ointment Place 1 application into the right eye at bedtime.    Marland Kitchen ezetimibe (ZETIA) 10 MG tablet Take 10 mg by mouth daily.     . Ferrous Fumarate (IRON) 18 MG TBCR Take 1 tablet by mouth daily.    Marland Kitchen LIPITOR 80 MG tablet Take 40 mg by mouth Daily.     . Melatonin 10 MG TABS Take 10 mg by mouth at bedtime as needed (sleep).    . niacin 500 MG tablet Take 500 mg by mouth 4 (four) times daily.     Glory Rosebush VERIO test strip     . oxyCODONE (OXY IR/ROXICODONE) 5 MG  immediate release tablet Take 5 mg by mouth 2 (two) times daily as needed for pain.    . Saccharomyces boulardii (PROBIOTIC) 250 MG CAPS Take 250 mg by mouth daily.    Marland Kitchen SYNJARDY 12.12-998 MG TABS Take 1 tablet by mouth daily.     . celecoxib (CELEBREX) 200 MG capsule Take 200 mg by mouth in the morning and at bedtime. (Patient not taking: Reported on 06/18/2020)    . ramipril (ALTACE) 5 MG capsule Take 1 capsule (5 mg total) by mouth daily. (Patient not taking: Reported on 06/18/2020) 90 capsule 3   Current Facility-Administered Medications  Medication Dose Route Frequency Provider Last Rate Last Admin  . 0.9 %  sodium chloride infusion  500 mL Intravenous Once Gatha Mayer, MD        REVIEW OF SYSTEMS:  Notable for that above.   PHYSICAL EXAM:  vitals were not taken for this visit.   General: Alert and oriented, in no acute distress  HEENT: Significant right facial  droop. Hard of hearing. Bolster dressing in place in right preauricular region  LABORATORY DATA:  Lab Results  Component Value Date   WBC 5.5 06/18/2020   HGB 11.0 (L) 06/18/2020   HCT 33.3 (L) 06/18/2020   HCT 34.1 (L) 06/18/2020   MCV 80.2 06/18/2020   PLT 62 (L) 06/18/2020   CMP     Component Value Date/Time   NA 132 (L) 06/18/2020 1508   K 4.2 06/18/2020 1508   CL 99 06/18/2020 1508   CO2 26 06/18/2020 1508   GLUCOSE 108 (H) 06/18/2020 1508   BUN 16 06/18/2020 1508   CREATININE 0.78 06/18/2020 1508   CALCIUM 9.3 06/18/2020 1508   PROT 7.7 06/18/2020 1508   ALBUMIN 3.3 (L) 06/18/2020 1508   AST 17 06/18/2020 1508   ALT 23 06/18/2020 1508   ALKPHOS 85 06/18/2020 1508   BILITOT 1.3 (H) 06/18/2020 1508   GFRNONAA >60 06/18/2020 1508   GFRAA  11/21/2007 1340    >60        The eGFR has been calculated using the MDRD equation. This calculation has not been validated in all clinical      No results found for: TSH   RADIOGRAPHY: VAS [place] LOWER EXTREMITY VENOUS (DVT)  Result Date: 06/10/2020  Lower Venous DVTStudy Indications: Swelling.  Risk Factors: Cancer Right parotid cancer. Comparison Study: No prior studies Performing Technologist: Darlin Coco  Examination Guidelines: A complete evaluation includes B-mode imaging, spectral Doppler, color Doppler, and power Doppler as needed of all accessible portions of each vessel. Bilateral testing is considered an integral part of a complete examination. Limited examinations for reoccurring indications may be performed as noted. The reflux portion of the exam is performed with the patient in reverse Trendelenburg.  +---------+---------------+---------+-----------+----------+--------------+ RIGHT    CompressibilityPhasicitySpontaneityPropertiesThrombus Aging +---------+---------------+---------+-----------+----------+--------------+ CFV      Full           Yes      Yes                                  +---------+---------------+---------+-----------+----------+--------------+ SFJ      Full                                                        +---------+---------------+---------+-----------+----------+--------------+  FV Prox  Full                                                        +---------+---------------+---------+-----------+----------+--------------+ FV Mid   Full                                                        +---------+---------------+---------+-----------+----------+--------------+ FV DistalFull                                                        +---------+---------------+---------+-----------+----------+--------------+ PFV      Full                                                        +---------+---------------+---------+-----------+----------+--------------+ POP      Full           Yes      Yes                                 +---------+---------------+---------+-----------+----------+--------------+ PTV      Full                                                        +---------+---------------+---------+-----------+----------+--------------+ PERO     Full                                                        +---------+---------------+---------+-----------+----------+--------------+   +---------+---------------+---------+-----------+----------+--------------+ LEFT     CompressibilityPhasicitySpontaneityPropertiesThrombus Aging +---------+---------------+---------+-----------+----------+--------------+ CFV      Full           Yes      Yes                                 +---------+---------------+---------+-----------+----------+--------------+ SFJ      Full                                                        +---------+---------------+---------+-----------+----------+--------------+ FV Prox  Full                                                         +---------+---------------+---------+-----------+----------+--------------+  FV Mid   Full                                                        +---------+---------------+---------+-----------+----------+--------------+ FV DistalFull                                                        +---------+---------------+---------+-----------+----------+--------------+ PFV      Full                                                        +---------+---------------+---------+-----------+----------+--------------+ POP      Full           Yes      Yes                                 +---------+---------------+---------+-----------+----------+--------------+ PTV      Full                                                        +---------+---------------+---------+-----------+----------+--------------+ PERO     Full                                                        +---------+---------------+---------+-----------+----------+--------------+     Summary: RIGHT: - There is no evidence of deep vein thrombosis in the lower extremity.  - No cystic structure found in the popliteal fossa.  LEFT: - There is no evidence of deep vein thrombosis in the lower extremity.  - No cystic structure found in the popliteal fossa.  *See table(s) above for measurements and observations. Electronically signed by Ruta Hinds MD on 06/10/2020 at 6:23:06 PM.    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 I do recommend adjuvant radiotherapy for this patient.  I spoke to the patient and his wife about the standard fractionation plan that would take place over 33 treatments versus a hypofractionated plan to take place over 20 treatments.  They would like to proceed with the most standard approach - the 33 treatment plan -after discussing the pros and cons of each option.  We discussed  the potential risks, benefits, and side effects of radiotherapy. We talked in detail about acute and late effects. We discussed that some of the most bothersome acute effects may be mucositis, dysgeusia, salivary changes, skin irritation, hair loss, dehydration, weight loss and fatigue. We talked about late effects which include but are not necessarily limited to dysphagia, hypothyroidism, nerve injury, vascular injury,  spinal cord injury, xerostomia, permanent hearing loss, trismus, neck edema, and potential injury to any of the tissues in the head and neck region.  He understands that a very small fraction of his brain will be exposed to radiation therapy but is very unlikely to cause cognitive decline.  We discussed other forms of cognitive slowing that can take place during oncologic treatment that are unrelated to radiation therapy.  No guarantees of treatment were given.  The patient is enthusiastic about proceeding with treatment; he and his wife expressed that they would like to be aggressive in his care and he understand that he has a serious condition. I look forward to participating in the patient's care.    Audiology -- would like to get baseline test that is recent. Will see if this can be done at [facility] or here in [place].  Our navigator will arrange.  Simulation (treatment planning) will take place tentatively on 07/09/20 but this can be moved back if his bolster is not removed by them.  The patient's wife will call us to move back his appointment to December 1, if needed, following a subsequent appointment with his otolaryngologist, Dr. Nicolette Bang.  We also discussed that the treatment of head and neck cancer is a multidisciplinary process to maximize treatment outcomes and quality of life. For this reason the following referrals have been or will be made:  Dentistry for dental evaluation, and /or advice on reducing risk of cavities, osteoradionecrosis, or other oral issues.  We discussed him at  tumor board and the understanding is that his main potential long-term issue is dry mouth, which he already reports he has, preceding radiation.   Nutritionist for nutrition support during and after treatment.  We spent a good deal of time discussing the importance of maintaining weight, pushing protein intake, etc.  They understand that a feeding tube will be necessary if he has substantial weight loss.   Speech language pathology for swallowing and/or speech therapy.   Social work for social support.    Physical therapy due to risk of lymphedema in neck and deconditioning.   Baseline labs including TSH.  On date of service, in total, I spent 65 minutes on this encounter. This encounter was provided by telemedicine platform MyChart video.  Patient and his wife chose telemedicine to reduce risks of exposures during the Covid pandemic. The patient has given verbal consent for this type of encounter and has been advised to only accept a meeting of this type in a secure network environment.  The attendants for this meeting include Eppie Gibson  and [name].  During the encounter, Eppie Gibson was located at [facility] Radiation Oncology Department.  [name] was located at home.   __________________________________________   Eppie Gibson, MD  This document serves as a record of services personally performed by Eppie Gibson, MD. It was created on his behalf by Clerance Lav, a trained medical scribe. The creation of this record is based on the scribe's personal observations and the provider's statements to them. This document has been checked and approved by the attending provider.

## 2020-07-05 NOTE — Progress Notes (Signed)
Oncology Nurse Navigator Documentation  Met with patient during initial consult with [name] via [place] video. He was accompanied by his wife Geoffrey Wood.  . Further introduced myself as his/their Navigator, explained my role as a member of the Care Team. . Assisted with post-consult appt scheduling. . I discussed his upcoming appointment with Dr. Benson Norway on 11/23 at 11:00. . I have also made them aware that I have scheduled an Audiology exam on 11/30 at 9 am at the request of [name] to be completed before radiation begins. I explained that the exam would be in the same location as Dr. Pollie Friar office and they voiced their understanding.  . They verbalized understanding of information provided. . I encouraged them to call with questions/concerns moving forward.  [name], RN, BSN, OCN Head & Neck Oncology Nurse [place] at [place] [phone]

## 2020-07-05 NOTE — Progress Notes (Signed)
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  He also has a previous history of a left-sided T1 tongue cancer that was removed by Dr. Melony Overly 9 years ago.  Biopsies revealed:  06/03/2020 A.  PAROTID AND OVERLYING SKIN, RIGHT, PAROTIDECTOMY:               Poorly differentiated carcinoma, see comment.               Tumor measures up 6 cm in greatest dimension. Lymphovascular invasion is present.  Metastatic carcinoma in two (of 3) lymph nodes with a metastatic focus measuring up to 1.8 cm with no extranodal extension (2/3).  Peripheral and deep inked margin of the specimen is positive for tumor.  B.  "TISSUE IN GLENOID FOSSA", EXCISION:               Fibrovascular adipose tissue, negative for malignancy.  C.  LYMPH NODES, RIGHT NECK, LEVEL 2B, DISSECTION:               Nine (9) lymph nodes are negative for carcinoma (0/9).  D.  LYMPH NODES, RIGHT NECK, LEVEL 2A, DISSECTION:               Multiple (14) lymph nodes are negative for carcinoma (0/14).   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 []  [x]  No major changes recently, but wife does report over the past 2 years patient has lost ~15 pounds without trying   Swallowing concerns? [x]  []  Occasionally feels like foods/medications get stuck about halfway down  PEG? []  [x]     Referrals Yes No Comments  Social Work? []  [x]    Dentistry? []  [x]    Swallowing therapy? [x]  []    Nutrition? [x]  []    Med/Onc? [x]  []     Safety Issues Yes No Comments  Prior radiation? []  [x]    Pacemaker/ICD? []  [x]    Possible current pregnancy? []  [x]  N/A   Is the patient on methotrexate? []  [x]     Tobacco/Marijuana/Snuff/ETOH use: Patient is a former smoker--quit in 2015 (was a 1 pack/day for ~50 years); Occasionally drinks alcohol; Denies any illicit drug use   Past/Anticipated interventions by otolaryngology, if any:  06/03/2020 Dr. Francina Ames -Wide excision of right preauricular cheek skin, 6 x 3 cm -Right parotidectomy with facial nerve dissection, ultimately requiring sacrifice of the upper division -Right selective neck dissection, levels 2-3 -Placement of wound-vac negative pressure device  05/02/2020 Dr. Melony Overly Fine needle aspiration of right parotid mass   Past/Anticipated interventions by medical oncology, if any:  Seen by Dr. Sullivan Lone for: anemia and thrombocytopenia Right Parotid Carcinoma: -Radiation therapy is set for positive margins  -Will defer management to ENT and RadOnc   PLAN: -Discussed pt labwork, 06/18/2020; resolving anemia, stable thrombocytopenia, WBC are nml, Ferritin & Iron Panel look good, Folate & Haptoglobin is WNL; Immature PLT Fract, LDH, Copper, & B12 are elevated.  -Advised pt that his LDH is elevated, but Haptoglobin is nml, which suggests against hemolysis. Anemia likely from recent surgery.  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 -Advised pt that if ITP does not resolve in three months it is likely that it will be life-long.  -Advised pt that ITP is treated if PLT <50K with increased risk of bleeding or <30K with no increased risk of bleeding.  -Advised pt that his PLT are currently okay for surgery and should not need intervention at this time, unless the surgeon suggests this.  -Advised pt that PLT can be given prior to, during, or after surgery in a supportive fashion if necessary.  -Will see back in 6 weeks with labs  Current Complaints / other  details:   Prior to patient's surgery, he lived a very active lifestyle. He has received both Pfizer vaccines as well as booster

## 2020-07-08 ENCOUNTER — Telehealth: Payer: Self-pay | Admitting: *Deleted

## 2020-07-08 NOTE — Telephone Encounter (Signed)
Called patient to inform of labs for 07-09-20 @ 2 pm, spoke with wife [name] and she is aware of this appt.

## 2020-07-09 ENCOUNTER — Other Ambulatory Visit: Payer: Self-pay

## 2020-07-09 ENCOUNTER — Ambulatory Visit
Admission: RE | Admit: 2020-07-09 | Discharge: 2020-07-09 | Disposition: A | Discharge status: Home / Self Care | Payer: Medicare Other | Source: Ambulatory Visit | Attending: Radiation Oncology | Admitting: Radiation Oncology

## 2020-07-09 ENCOUNTER — Encounter: Payer: Self-pay | Admitting: General Practice

## 2020-07-09 ENCOUNTER — Ambulatory Visit (HOSPITAL_COMMUNITY): Payer: Self-pay | Admitting: Dentistry

## 2020-07-09 DIAGNOSIS — C7989 Secondary malignant neoplasm of other specified sites: Secondary | ICD-10-CM | POA: Diagnosis not present

## 2020-07-09 DIAGNOSIS — K0530 Chronic periodontitis, unspecified: Secondary | ICD-10-CM | POA: Diagnosis not present

## 2020-07-09 DIAGNOSIS — K117 Disturbances of salivary secretion: Secondary | ICD-10-CM

## 2020-07-09 DIAGNOSIS — Z1329 Encounter for screening for other suspected endocrine disorder: Secondary | ICD-10-CM

## 2020-07-09 DIAGNOSIS — C760 Malignant neoplasm of head, face and neck: Secondary | ICD-10-CM | POA: Insufficient documentation

## 2020-07-09 DIAGNOSIS — C07 Malignant neoplasm of parotid gland: Secondary | ICD-10-CM

## 2020-07-09 DIAGNOSIS — T888XXA Other specified complications of surgical and medical care, not elsewhere classified, initial encounter: Secondary | ICD-10-CM

## 2020-07-09 DIAGNOSIS — K08409 Partial loss of teeth, unspecified cause, unspecified class: Secondary | ICD-10-CM

## 2020-07-09 DIAGNOSIS — R5381 Other malaise: Secondary | ICD-10-CM

## 2020-07-09 DIAGNOSIS — K080 Exfoliation of teeth due to systemic causes: Secondary | ICD-10-CM | POA: Diagnosis not present

## 2020-07-09 LAB — TSH: TSH: 1.148 u[IU]/mL (ref 0.320–4.118)

## 2020-07-09 NOTE — Progress Notes (Signed)
DENTAL VISIT OUTPATIENT CONSULTATION  Service Date:   07/09/2020 Referring Provider:                  Eppie Gibson, MD  Patient Name:   Geoffrey Wood Date of Birth:   05-30-1946 Medical Record Number: [id number]   _______________________________  PLAN/RECOMMENDATIONS: . Patient is cleared to begin radiation therapy from a dental perspective at this time.  There are NO signs of severe dental decay, acute infection or abscess, or suspicious intraoral/extraoral pathology requiring biopsy.  . Recommend patient return to our clinic when he has completed radiation for a follow-up appointment.  He will then return to his regular dentist for routine dental care including cleanings and exams.  . Discussed in detail all options with the patient and he is agreeable to the plan.  Thank you for consulting with Hospital Dentistry and for the opportunity to participate in this patient's treatment.  Should you have any questions or concerns, please contact the [facility] at [phone]. ______________________________   COVID 19 SCREENING: The patient denies symptoms concerning for COVID-19 infection including fever, chills, cough, or newly developed shortness of breath.   HPI: Geoffrey Wood is a very pleasant 74 y.o. 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  He presents today with his wife for a dental evaluation as part of his Pre-XRT work-up. Dental History: Patient does have a dentist that he sees regularly in [place], [place].  He has been routinely for periodontal maintenance appointments, and his last visit was about 3-4 weeks ago for a check-up and cleaning.  He reports having some numbness on the right side ever since his surgery, but denies any dental/oral pain or sensitivity.  He does also report some dry-mouth following his surgery. Patient able to manage  oral secretions.  Patient denies dysphagia, odynophagia, dysphonia, new onset SOB and neck pain.  Patient denies fever and rigors.  He reports some malaise d/t recent issues with low platelets and anemia.   CHIEF COMPLAINT: "My lip is numb on the right side ever since my surgery."   Patient Active Problem List   Diagnosis Date Noted  . Malignant neoplasm of parotid gland ([place]) 07/05/2020  . Coronary artery calcification 11/16/2018  . Pulmonary nodules 01/22/2014  . Personal history of adenomatous colonic polyps 12/24/2011  . HTN (hypertension) 04/21/2011  . Hyperlipidemia 04/21/2011   Past Medical History:  Diagnosis Date  . Adenomatous colon polyp   . Cataract   . Diabetes mellitus   . Emphysema   . Emphysema of lung ([place])   . Erectile dysfunction   . Hyperlipidemia   . Hypertension   . Peripheral vascular disease [facility])    Past Surgical History:  Procedure Laterality Date  . ABDOMINAL AORTIC ANEURYSM REPAIR  2004  . COLONOSCOPY  multiple since 2004   No Known Allergies Current Outpatient Medications  Medication Sig Dispense Refill  . acetaminophen (TYLENOL) 325 MG tablet Take 325 mg by mouth every 6 (six) hours as needed for mild pain.    [name] 62.5-25 MCG/INH AEPB Take 1 puff by mouth daily.    . Ascorbic Acid (VITAMIN C) 500 MG CAPS Take 1 capsule by mouth 2 (two) times daily.    . celecoxib (CELEBREX) 200 MG capsule Take 200 mg by mouth in the morning and at bedtime. (Patient not taking: Reported on 06/18/2020)    . Cholecalciferol (VITAMIN D  PO) Take 800 Units by mouth daily.     . Cyanocobalamin (VITAMIN B 12 PO) Take 500 mcg by mouth daily.     . ERGOCALCIFEROL PO Take 800 Units by mouth daily.    Marland Kitchen erythromycin ophthalmic ointment Place 1 application into the right eye at bedtime.    Marland Kitchen ezetimibe (ZETIA) 10 MG tablet Take 10 mg by mouth daily.     . Ferrous Fumarate (IRON) 18 MG TBCR Take 1 tablet by mouth daily.    Marland Kitchen LIPITOR 80 MG tablet Take 40 mg by  mouth Daily.     . Melatonin 10 MG TABS Take 10 mg by mouth at bedtime as needed (sleep).    . niacin 500 MG tablet Take 500 mg by mouth 4 (four) times daily.     Glory Rosebush VERIO test strip     . oxyCODONE (OXY IR/ROXICODONE) 5 MG immediate release tablet Take 5 mg by mouth 2 (two) times daily as needed for pain.    . ramipril (ALTACE) 5 MG capsule Take 1 capsule (5 mg total) by mouth daily. (Patient not taking: Reported on 06/18/2020) 90 capsule 3  . Saccharomyces boulardii (PROBIOTIC) 250 MG CAPS Take 250 mg by mouth daily.    Marland Kitchen SYNJARDY 12.12-998 MG TABS Take 1 tablet by mouth daily.      Current Facility-Administered Medications  Medication Dose Route Frequency Provider Last Rate Last Admin  . 0.9 %  sodium chloride infusion  500 mL Intravenous Once Gatha Mayer, MD        LABS: Lab Results  Component Value Date   WBC 5.5 06/18/2020   HGB 11.0 (L) 06/18/2020   HCT 33.3 (L) 06/18/2020   HCT 34.1 (L) 06/18/2020   MCV 80.2 06/18/2020   PLT 62 (L) 06/18/2020      Component Value Date/Time   NA 132 (L) 06/18/2020 1508   K 4.2 06/18/2020 1508   CL 99 06/18/2020 1508   CO2 26 06/18/2020 1508   GLUCOSE 108 (H) 06/18/2020 1508   BUN 16 06/18/2020 1508   CREATININE 0.78 06/18/2020 1508   CALCIUM 9.3 06/18/2020 1508   GFRNONAA >60 06/18/2020 1508   GFRAA  11/21/2007 1340    >60        The eGFR has been calculated using the MDRD equation. This calculation has not been validated in all clinical   No results found for: INR, PROTIME No results found for: PTT  Social History   Socioeconomic History  . Marital status: Married    Spouse name: Not on file  . Number of children: Not on file  . Years of education: Not on file  . Highest education level: Not on file  Occupational History  . Occupation: Scientist, clinical (histocompatibility and immunogenetics): Designer, television/film set  Tobacco Use  . Smoking status: Former Smoker    Packs/day: 1.00    Years: 50.00    Pack years: 50.00    Types:  Cigarettes    Quit date: 09/17/2013    Years since quitting: 6.8  . Smokeless tobacco: Never Used  Vaping Use  . Vaping Use: Never used  Substance and Sexual Activity  . Alcohol use: Yes    Comment: not even 1 drink a week  . Drug use: No  . Sexual activity: Not Currently  Other Topics Concern  . Not on file  Social History Narrative  . Not on file   Social Determinants of Health   Financial Resource Strain:   .  Difficulty of Paying Living Expenses: Not on file  Food Insecurity:   . Worried About Charity fundraiser in the Last Year: Not on file  . Ran Out of Food in the Last Year: Not on file  Transportation Needs:   . Lack of Transportation (Medical): Not on file  . Lack of Transportation (Non-Medical): Not on file  Physical Activity:   . Days of Exercise per Week: Not on file  . Minutes of Exercise per Session: Not on file  Stress:   . Feeling of Stress : Not on file  Social Connections:   . Frequency of Communication with Friends and Family: Not on file  . Frequency of Social Gatherings with Friends and Family: Not on file  . Attends Religious Services: Not on file  . Active Member of Clubs or Organizations: Not on file  . Attends Archivist Meetings: Not on file  . Marital Status: Not on file  Intimate Partner Violence:   . Fear of Current or Ex-Partner: Not on file  . Emotionally Abused: Not on file  . Physically Abused: Not on file  . Sexually Abused: Not on file   Family History  Problem Relation Age of Onset  . Heart failure Mother   . Diabetes Mother   . Cancer Father        lung  . Colon cancer Neg Hx   . Esophageal cancer Neg Hx   . Liver cancer Neg Hx   . Pancreatic cancer Neg Hx   . Rectal cancer Neg Hx   . Stomach cancer Neg Hx      REVIEW OF SYSTEMS: Reviewed with the patient as per HPI. PSYCH: Patient denies having dental phobia.   VITAL SIGNS: BP 123/63 (BP Location: Right Arm)   Pulse 61   Temp 98 F (36.7 C)     PHYSICAL EXAMINATION: GENERAL: Well-developed, comfortable and in no apparent distress.  Patient has difficulty hearing most significantly on the right side.  He has a hearing aid in the left ear. NEUROLOGICAL: Alert and oriented to person, place and time. EXTRAORAL:  Face without any edema or erythema.  No swelling or lymphadenopathy.  Upper lip droops right of midline.  Recent surgery site with packing still present on right. INTRAORAL: Soft tissues appear well-perfused and mucous membranes moist with some mild Xerostomia.  FOM and vestibules soft and not raised. Oral cavity without mass or lesion. No signs of infection, parulis, sinus tract, edema or erythema evident upon exam.  MIO = 40m  DENTAL EXAMINATION: DENTITION: Overall good remaining dentition.  Missing teeth, heavily restored dentition including dental implants.   The patient is maintaining fair oral hygiene. PERIODONTAL: Pink, healthy gingival tissue with blunted papilla.  Gingival recession.  Mild generalized plaque accumulation. DENTAL CARIES/DEFECTIVE RESTORATIONS: #19 PFM crown has occlusal wear/chipping, appears to have good marginal integrity clinically. ENDODONTIC: #19 previous RCT with definitive crown. CROWN/BRIDGE: #4, #5, #6, #7, #9, #10, #12 and #19 all have full-coverage crowns in good condition.  #13, #14, #30 and #31 are implant crowns. PROSTHODONTIC: Patient denies having or wearing partial dentures. OCCLUSION: Class I molar occlusion.  RADIOGRAPHIC EXAMINATION PAN Full Mouth Series exposed and interpreted: Condyles seated bilaterally in fossas.  No evidence of abnormal pathology.  All visualized osseous structures appear WNL.  Radiopaque pin-like objects on the right side in area of parotid gland. Generalized mild to  moderate horizontal bone loss consistent with mild to moderate periodontitis. #19 with signs of bone loss through furcation.  Missing teeth, existing restorations, multiple teeth with existing  crowns. #19 previously Endo treated with gutta percha appearing ~44m short of mesial and distal apices. #13, #14, #30 and #31 dental implants with implant-crowns.   ASSESSMENT 1. Parotid Gland Carcinoma 2. Pre-Radiation Dental Consultation 3. Coronary Artery Disease 4. HTN 5. Diabetes mellitus 6. Hyperlipidemia 7. Missing teeth 8. Chronic periodontitis 9. Gingival recession 10. Xerostomia 11. Postoperative bleeding risk   PLAN/RECOMMENDATIONS  . I discussed the risks, benefits, and complications of various treatment options with the patient and his wife in relationship to his medical and dental conditions. We discussed various treatment options to include no treatment, periodontal therapy, and dental restorations.  Discussed that although he is at low-risk for osteoradionecrosis following radiation since his cancer is in his parotid gland, he is still at high risk for developing lifelong xerostomia, trismus, and radiation caries.  Trismus exercise appliance given to patient today and explanation given on how to use.  . The patient verbalized understanding of all options, and currently wishes to proceed with scheduling a follow-up visit when he has completed radiation and then return to his regular dentist for routine dental care.  . Discussion of findings with medical team and coordination of future medical and dental care as needed.   The patient tolerated today's visit well.  All questions/concerns were addressed and the patient departed in stable condition.   I spent in excess of 120 minutes during the conduct of this consultation and >50% of this time involved direct face-to-face encounter for counseling and/or coordination of the patient's care.   MConejosOBenson Norway DMD

## 2020-07-09 NOTE — Patient Instructions (Signed)
[facility] of Dental Medicine Dr. Cleone Hulick B. [facility] [place] Phone: [phone] Fax: [phone]   It was a pleasure seeing you today!  Please refer to the information below regarding your dental visit with us, and call us should you have any questions or concerns that may come up after you leave.   Thank you for giving us the opportunity to provide care for you.  If there is anything we can do for you, please let us know.     RADIATION THERAPY AND INFORMATION REGARDING YOUR TEETH   [x]Xerostomia (Dry Mouth) Your salivary glands may be in the field of radiation.  Radiation may include all or only part of your salivary glands.  This will cause your saliva to dry up, and you will have a dry mouth.  The dry mouth will be for the rest of your life unless your radiation oncologist tells you otherwise.  Your saliva has many functions:  It wets your tongue for speaking.  It coats your teeth and the inside of your mouth for easier movement.  It helps with chewing and swallowing food.  It helps clean away harmful acid and toxic products made by the germs in your mouth, therefore it helps prevent cavities.  It kills some germs in your mouth and helps to prevent gum disease.  It helps to carry flavor to your taste buds.  Once you have lost your saliva you will be at higher risk for tooth decay and gum disease.    What can be done to help improve your mouth when there's not enough saliva: . Your dentist may give a prescription for Salagen.  It will not bring back all of your saliva but may bring back some of it.  Also, your saliva may be thick and ropy or white and foamy. It will not feel like it use to feel. . You will need to swish with water every time your mouth feels dry.  YOU CANNOT suck on any cough drops, mints, lemon drops, candy, vitamin C or any other products.  You cannot use anything other than water to make your mouth feel less dry.  If you want to drink anything else,  you have to drink it all at once and brush afterwards.  Be sure to discuss the details of your diet habits with your dentist or hygienist.  [x]Radiation caries: . This is decay (cavities) that happens very quickly once your mouth is very dry due to radiation therapy.  Normally, cavities take six months to two years to become a problem.  When you have dry mouth, cavities may take as little as eight weeks to cause you a problem.   . Dental check-ups every two months are necessary as long as you have a dry mouth. Radiation caries typically, but not always, start at your gum line where it is hard to see the cavity.  It is therefore also hard to fill these cavities adequately.  This high rate of cavities happens because your mouth no longer has saliva and therefore the acid made by the germs starts the decay process.  Whenever you eat anything the germs in your mouth change the food into acid.  The acid then burns a small hole in your tooth.  This small hole is the beginning of a cavity.  If this is not treated then it will grow bigger and become a cavity.  The way to avoid this hole getting bigger is to use fluoride every evening as prescribed by your   dentist following your radiation.   NOTE:  You have to make sure that your teeth are very clean before you use the fluoride.  This fluoride in turn will strengthen your teeth and prepare them for another day of fighting acid.  If you develop radiation caries many times, the damage is so large that you will have to have all your teeth removed.  This could be a big problem if some of these teeth are in the field of radiation.  Further details of why this could be a big problem will follow.  (See Osteoradionecrosis).  [x]Dysgeusia (Loss of Taste) This happens to varying degrees once you've had radiation therapy to your jaw region.  Many times taste is not completely lost, but becomes limited.  The loss of taste is mostly due to radiation affecting your taste buds.   However, if you have no saliva in your mouth to carry the flavor to your taste buds, it would be difficult for your taste buds to taste anything.  That is why using water or a prescription for Salagen prior to meals and during meal times may help with some of the taste.  Keep in mind that taste generally returns very slowly over the course of several months or several years after radiation therapy.  Don't give up hope.  [x]Trismus (Limited Jaw Opening) According to your Radiation Oncologist, your TMJ or jaw joints are going to be partially or fully in the field of radiation.  This means that over time the muscles that help you open and close your mouth may get stiff.  This will potentially result in your not being able to open your mouth wide enough or as wide as you can open it now.    Let me give you an example of how slowly this happens and how unaware people are of it:   A gentlemen that had radiation therapy two years ago came back to me complaining that bananas are just too large for him to be able to fit them in between his teeth.  He was not able to open wide enough to bite into a banana.  This happens slowly and over a period of time.  What we do to try and prevent this:   . Your dentist will probably give you a stack of sticks called a trismus exercise device.  This stack will help remind your muscles and your jaw joints to open up to the same distance every day.  Use these sticks every morning when you wake up, or according to the instructions given by your dentist.    . You must use these sticks for at least one to two years after radiation therapy.  The reason for that is because it happens so slowly and keeps going on for about two years after radiation therapy.  Your hospital dentist will help you monitor your mouth opening and make sure that it's not getting smaller after radiation.  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 This is a condition where your jaw bone after radiation therapy becomes  very dry.  It has very little blood supply to keep it alive.  If you develop a cavity that turns into an abscess or an infection, then the jaw bone does not have enough blood supply to help fight the infection.  At this point it is very likely that the infection could cause the death of your jaw bone.  When you have dead bone it has to be removed.  Therefore, you might end up having   to have surgery to remove part of your jaw bone, the part of the jaw bone that has been affected.    . Healing is also a problem if you are to have surgery (like a tooth extraction) in the areas where the bone has had radiation therapy.  If you have surgery, you need more blood supply to heal which is not available.  When blood supply and oxygen are not available, there is a chance for the bone to die. . Occasionally, ORN happens on its own with no obvious reason, but this is quite rare.  We believe that patients who continue to smoke and/or drink alcohol have a higher chance of having this problem. . Once your jaw bone has had radiation therapy, if there are any remaining teeth in that area, it is not recommended to have them pulled unless your dentist or oral surgeon is aware of your history of radiation and believes it is safe.  . The risks for ORN either from infection or spontaneously occurring (with no reason) are life long. 

## 2020-07-09 NOTE — Progress Notes (Signed)
[facility] CSW Progress Notes  Call to patient per referral of new diagnosis of head/neck cancer.  Unable to reach patient - left VM w my contact information and will try him again.  Edwyna Shell, LCSW Clinical Social Worker Phone:  [phone]

## 2020-07-10 ENCOUNTER — Encounter: Payer: Self-pay | Admitting: General Practice

## 2020-07-10 NOTE — Progress Notes (Signed)
Oncology Nurse Navigator Documentation  To provide support, encouragement and care continuity, met with Mr. Palau during his CT SIM on 07/09/20. He was accompanied by his wife. He tolerated procedure without difficulty, denied questions/concerns.   I toured him to the radiation treatment area, explained procedures for lobby registration, arrival to Radiation Waiting, arrival to tmt area and preparation for tmt.  He voiced understanding.   I encouraged him to call me prior to 07/22/20 [place] with any questions or concerns.  [name] RN, BSN, OCN Head & Neck Oncology Nurse [place] [facility] Phone # [phone]  Fax # [phone]

## 2020-07-10 NOTE — Progress Notes (Signed)
[facility] [facility] CSW Progress Notes  Call to patient per referral for social support.  Spoke w wife.  She states that they are fully independent, no financial or social service needs at this time.  They are well versed in dealing with medical issues and are getting used to the idea of now needing treatment for cancer.  They appreciate the support of the nurse navigator and will let her know of any needs.  Briefly described the [place] and encouraged her to let us know if we can help in any way.  Edwyna Shell, LCSW Clinical Social Worker Phone:  [phone]

## 2020-07-15 ENCOUNTER — Ambulatory Visit (INDEPENDENT_AMBULATORY_CARE_PROVIDER_SITE_OTHER): Payer: Medicare Other | Admitting: Pulmonary Disease

## 2020-07-15 ENCOUNTER — Encounter: Payer: Self-pay | Admitting: Pulmonary Disease

## 2020-07-15 ENCOUNTER — Ambulatory Visit (INDEPENDENT_AMBULATORY_CARE_PROVIDER_SITE_OTHER): Payer: Medicare Other

## 2020-07-15 ENCOUNTER — Other Ambulatory Visit: Payer: Self-pay

## 2020-07-15 VITALS — BP 108/56 | HR 82 | Temp 97.5°F | Ht 72.0 in | Wt 156.4 lb

## 2020-07-15 DIAGNOSIS — J849 Interstitial pulmonary disease, unspecified: Secondary | ICD-10-CM | POA: Diagnosis not present

## 2020-07-15 DIAGNOSIS — I2584 Coronary atherosclerosis due to calcified coronary lesion: Secondary | ICD-10-CM

## 2020-07-15 DIAGNOSIS — I2510 Atherosclerotic heart disease of native coronary artery without angina pectoris: Secondary | ICD-10-CM

## 2020-07-15 LAB — CBC WITH DIFFERENTIAL/PLATELET
Basophils Absolute: 0.1 10*3/uL (ref 0.0–0.1)
Basophils Relative: 1.8 % (ref 0.0–3.0)
Eosinophils Absolute: 0 10*3/uL (ref 0.0–0.7)
Eosinophils Relative: 0.7 % (ref 0.0–5.0)
HCT: 33.1 % — ABNORMAL LOW (ref 39.0–52.0)
Hemoglobin: 11 g/dL — ABNORMAL LOW (ref 13.0–17.0)
Lymphocytes Relative: 31.4 % (ref 12.0–46.0)
Lymphs Abs: 2 10*3/uL (ref 0.7–4.0)
MCHC: 33.1 g/dL (ref 30.0–36.0)
MCV: 76.1 fl — ABNORMAL LOW (ref 78.0–100.0)
Monocytes Absolute: 1.1 10*3/uL — ABNORMAL HIGH (ref 0.1–1.0)
Monocytes Relative: 18.1 % — ABNORMAL HIGH (ref 3.0–12.0)
Neutro Abs: 3 10*3/uL (ref 1.4–7.7)
Neutrophils Relative %: 48 % (ref 43.0–77.0)
Platelets: 80 10*3/uL — ABNORMAL LOW (ref 150.0–400.0)
RBC: 4.35 Mil/uL (ref 4.22–5.81)
RDW: 31 % — ABNORMAL HIGH (ref 11.5–15.5)
WBC: 6.3 10*3/uL (ref 4.0–10.5)

## 2020-07-15 NOTE — Patient Instructions (Signed)
We will get some labs today including CBC with differential, serologies for interstitial lung disease  We will schedule high-resolution CT and PFTs for evaluation of interstitial lung disease at the end of January or early February Follow-up in clinic after that for reevaluation and plan for next steps.

## 2020-07-15 NOTE — Progress Notes (Addendum)
Geoffrey Wood    [id number]    [birth year]  Primary Care Physician:Perini, Elta Guadeloupe, MD  Referring Physician: Crist Infante, [place],  [place] [address]  Chief complaint: Consult for interstitial lung disease  HPI: 74 year old heavy ex-smoker with history of emphysema, hyperlipidemia, hypertension, pulmonary nodules. Referred for evaluation of interstitial lung disease, pulmonary fibrosis noted on screening CT of the chest. He has chronic dyspnea on exertion which has been gradually getting worse over the past few years, chronic cough with white mucus.  No wheezing.  He has a recent diagnosis of parotid cancer and underwent 2 surgeries under general anesthesia which he tolerated well and is going to start radiation therapy which will extend to end of January 2022. Also recently evaluated by hematology for low platelets, possible ITP.  Further work-up is in progress for this.  Pets: No pets Occupation: Retired Hotel manager for [employer] Exposures: No known exposures.  No mold, hot tub, Jacuzzi ILD questionnaire 07/15/2020-negative Smoking history: 90-pack-year smoker.  Quit in 2016 Travel history: Originally from [place].  No significant recent travel Relevant family history: Father had lung cancer, mother had COPD.  Both were heavy smokers.   Outpatient Encounter Medications as of 07/15/2020  Medication Sig  . acetaminophen (TYLENOL) 325 MG tablet Take 325 mg by mouth every 6 (six) hours as needed for mild pain.  [name] 62.5-25 MCG/INH AEPB Take 1 puff by mouth daily.  . Ascorbic Acid (VITAMIN C) 500 MG CAPS Take 1 capsule by mouth 2 (two) times daily.  . celecoxib (CELEBREX) 200 MG capsule Take 200 mg by mouth in the morning and at bedtime. (Patient not taking: Reported on 06/18/2020)  . Cholecalciferol (VITAMIN D PO) Take 800 Units by mouth daily.   . Cyanocobalamin (VITAMIN B 12 PO) Take 500 mcg by mouth daily.   . ERGOCALCIFEROL PO Take 800  Units by mouth daily.  Marland Kitchen erythromycin ophthalmic ointment Place 1 application into the right eye at bedtime.  Marland Kitchen ezetimibe (ZETIA) 10 MG tablet Take 10 mg by mouth daily.   . Ferrous Fumarate (IRON) 18 MG TBCR Take 1 tablet by mouth daily.  Marland Kitchen LIPITOR 80 MG tablet Take 40 mg by mouth Daily.   . Melatonin 10 MG TABS Take 10 mg by mouth at bedtime as needed (sleep).  . niacin 500 MG tablet Take 500 mg by mouth 4 (four) times daily.   Glory Rosebush VERIO test strip   . oxyCODONE (OXY IR/ROXICODONE) 5 MG immediate release tablet Take 5 mg by mouth 2 (two) times daily as needed for pain.  . ramipril (ALTACE) 5 MG capsule Take 1 capsule (5 mg total) by mouth daily. (Patient not taking: Reported on 06/18/2020)  . Saccharomyces boulardii (PROBIOTIC) 250 MG CAPS Take 250 mg by mouth daily.  Marland Kitchen SYNJARDY 12.12-998 MG TABS Take 1 tablet by mouth daily.    Facility-Administered Encounter Medications as of 07/15/2020  Medication  . 0.9 %  sodium chloride infusion    Allergies as of 07/15/2020  . (No Known Allergies)    Past Medical History:  Diagnosis Date  . Adenomatous colon polyp   . Cataract   . Diabetes mellitus   . Emphysema   . Emphysema of lung ([place])   . Erectile dysfunction   . Hyperlipidemia   . Hypertension   . Peripheral vascular disease ([place])   . Pneumonia   . Pulmonary nodule     Past Surgical History:  Procedure Laterality Date  . ABDOMINAL AORTIC ANEURYSM REPAIR  2004  . COLONOSCOPY  multiple since 2004    Family History  Problem Relation Age of Onset  . Heart failure Mother   . Diabetes Mother   . Cancer Father        lung  . Colon cancer Neg Hx   . Esophageal cancer Neg Hx   . Liver cancer Neg Hx   . Pancreatic cancer Neg Hx   . Rectal cancer Neg Hx   . Stomach cancer Neg Hx     Social History   Socioeconomic History  . Marital status: Married    Spouse name: Not on file  . Number of children: Not on file  . Years of education: Not on file  . Highest  education level: Not on file  Occupational History  . Occupation: Scientist, clinical (histocompatibility and immunogenetics): Designer, television/film set  Tobacco Use  . Smoking status: Former Smoker    Packs/day: 1.50    Years: 60.00    Pack years: 90.00    Types: Cigarettes    Quit date: 09/17/2014    Years since quitting: 5.8  . Smokeless tobacco: Never Used  Vaping Use  . Vaping Use: Never used  Substance and Sexual Activity  . Alcohol use: Yes    Comment: not even 1 drink a week  . Drug use: No  . Sexual activity: Not Currently  Other Topics Concern  . Not on file  Social History Narrative  . Not on file   Social Determinants of Health   Financial Resource Strain:   . Difficulty of Paying Living Expenses: Not on file  Food Insecurity: No Food Insecurity  . Worried About Charity fundraiser in the Last Year: Never true  . Ran Out of Food in the Last Year: Never true  Transportation Needs: No Transportation Needs  . Lack of Transportation (Medical): No  . Lack of Transportation (Non-Medical): No  Physical Activity:   . Days of Exercise per Week: Not on file  . Minutes of Exercise per Session: Not on file  Stress:   . Feeling of Stress : Not on file  Social Connections:   . Frequency of Communication with Friends and Family: Not on file  . Frequency of Social Gatherings with Friends and Family: Not on file  . Attends Religious Services: Not on file  . Active Member of Clubs or Organizations: Not on file  . Attends Archivist Meetings: Not on file  . Marital Status: Not on file  Intimate Partner Violence:   . Fear of Current or Ex-Partner: Not on file  . Emotionally Abused: Not on file  . Physically Abused: Not on file  . Sexually Abused: Not on file    Review of systems: Review of Systems  Constitutional: Negative for fever and chills.  HENT: Negative.   Eyes: Negative for blurred vision.  Respiratory: as per HPI  Cardiovascular: Negative for chest pain and palpitations.   Gastrointestinal: Negative for vomiting, diarrhea, blood per rectum. Genitourinary: Negative for dysuria, urgency, frequency and hematuria.  Musculoskeletal: Negative for myalgias, back pain and joint pain.  Skin: Negative for itching and rash.  Neurological: Negative for dizziness, tremors, focal weakness, seizures and loss of consciousness.  Endo/Heme/Allergies: Negative for environmental allergies.  Psychiatric/Behavioral: Negative for depression, suicidal ideas and hallucinations.  All other systems reviewed and are negative.  Physical Exam: Blood pressure (!) 108/56, pulse 82, temperature (!) 97.5 F (36.4 C), temperature source  Temporal, height 6' (1.829 m), weight 156 lb 6.4 oz (70.9 kg), SpO2 96 %. Gen:      No acute distress HEENT:  EOMI, sclera anicteric Neck:     No masses; no thyromegaly Lungs:    Clear to auscultation bilaterally; normal respiratory effort CV:         Regular rate and rhythm; no murmurs Abd:      + bowel sounds; soft, non-tender; no palpable masses, no distension Ext:    No edema; adequate peripheral perfusion Skin:      Warm and dry; no rash Neuro: alert and oriented x 3 Psych: normal mood and affect  Data Reviewed: Imaging: Low-dose screening CT 04/16/2020-moderate emphysema, stable pulmonary nodules, scattered areas of cylindrical bronchiectasis with reticulation and possible honeycombing in the mid to lower lungs. 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  We will get serologies for routine work-up of interstitial lung disease. Patient is requesting a CBC to reassess his hemoglobin and platelets as they were low earlier this month. Schedule high-res CT and PFTs for evaluation of the lung and follow-up to discuss results and plan for next  steps.  Plan/Recommendations: CBC, CTD serologies High-res CT, PFTs   Marshell Garfinkel MD [facility] 07/15/2020, 3:41 PM  CC: Crist Infante, MD

## 2020-07-18 ENCOUNTER — Encounter: Payer: Self-pay | Admitting: General Practice

## 2020-07-18 ENCOUNTER — Ambulatory Visit: Payer: Medicare Other | Attending: Radiation Oncology

## 2020-07-18 ENCOUNTER — Ambulatory Visit: Payer: Medicare Other | Admitting: Physical Therapy

## 2020-07-18 ENCOUNTER — Other Ambulatory Visit: Payer: Self-pay

## 2020-07-18 ENCOUNTER — Encounter: Payer: Self-pay | Admitting: Physical Therapy

## 2020-07-18 DIAGNOSIS — R1311 Dysphagia, oral phase: Secondary | ICD-10-CM | POA: Insufficient documentation

## 2020-07-18 DIAGNOSIS — C07 Malignant neoplasm of parotid gland: Secondary | ICD-10-CM | POA: Insufficient documentation

## 2020-07-18 DIAGNOSIS — R293 Abnormal posture: Secondary | ICD-10-CM | POA: Diagnosis present

## 2020-07-18 LAB — ANA,IFA RA DIAG PNL W/RFLX TIT/PATN
Anti Nuclear Antibody (ANA): POSITIVE — AB
Cyclic Citrullin Peptide Ab: <16 U
Rheumatoid fact SerPl-aCnc: <14 [IU]/mL

## 2020-07-18 LAB — SJOGREN'S SYNDROME ANTIBODS(SSA + SSB)
SSA (Ro) (ENA) Antibody, IgG: <1 AI
SSB (La) (ENA) Antibody, IgG: <1 AI

## 2020-07-18 LAB — ANCA SCREEN W REFLEX TITER: ANCA Screen: NEGATIVE

## 2020-07-18 LAB — ANTI-NUCLEAR AB-TITER (ANA TITER): ANA Titer 1: 1:40 {titer} — ABNORMAL HIGH

## 2020-07-18 LAB — ANTI-SCLERODERMA ANTIBODY: Scleroderma (Scl-70) (ENA) Antibody, IgG: <1 AI

## 2020-07-18 NOTE — Patient Instructions (Addendum)
SWALLOWING EXERCISES Do these until six months after your last day of radiation, then 2-3 times per week afterwards  1. Effortful Swallows - Press your tongue against the roof of your mouth for 3 seconds, then squeeze the muscles in your neck while you swallow your saliva or a sip of water - Repeat 10-15 times, 2-3 times a day, and use whenever you eat or drink  2. Pitch Raise - Repeat "he", once per second in as high of a pitch as you can - Repeat 20 times, 2-3 times a day  3. Chin pushback - Open your mouth  - Place your fist UNDER your chin near your neck - Tuck your chin and push back with your fist for 5 seconds - Repeat 10 times, 2-3 times a day

## 2020-07-18 NOTE — Therapy (Signed)
[facility] [address] [place], [place], [address] Phone: [phone]   Fax:  [phone]  Speech Language Pathology Evaluation  Patient Details  Name: Geoffrey Wood MRN: [id number] Date of Birth: 12-09-1945 Referring Provider (SLP): Eppie Gibson, MD   Encounter Date: 07/18/2020   End of Session - 07/18/20 1304    Visit Number 1    Number of Visits 7    Date for SLP Re-Evaluation 10/16/20   90 daya   SLP Start Time 4585    SLP Stop Time  1110    SLP Time Calculation (min) 33 min    Activity Tolerance Patient tolerated treatment well           Past Medical History:  Diagnosis Date  . Adenomatous colon polyp   . Cataract   . Diabetes mellitus   . Emphysema   . Emphysema of lung ([place])   . Erectile dysfunction   . Hyperlipidemia   . Hypertension   . Peripheral vascular disease ([place])   . Pneumonia   . Pulmonary nodule     Past Surgical History:  Procedure Laterality Date  . ABDOMINAL AORTIC ANEURYSM REPAIR  2004  . COLONOSCOPY  multiple since 2004    There were no vitals filed for this visit.       SLP Evaluation [facility] - 07/18/20 1047      SLP Visit Information   SLP Received On 07/18/20    Referring Provider (SLP) Eppie Gibson, MD    Onset Date September 2021      Subjective   Patient/Family Stated Goal "my swallowing be as normal as possible"      General Information   HPI Presented ENT 04-2020 with rt parotid mass, FNA 05-02-20 revealed malignant cells consistent with non-small cell CA. PET 05-15-20 revealed two lesions associated with rt parotid with intense metabolic activity. No evidence of adenopathy, nor distant mets. On 06-03-20 Dr. Nicolette Bang performed rt parotidectomy with facial nerve dissection which ultimately req'd sacrifice of upper division of rt facial nerve. Rt selective neck dissection also that date.     Mobility Status Ambulatory - active      Prior Functional Status    Cognitive/Linguistic Baseline Within functional limits      Auditory Comprehension   Overall Auditory Comprehension Appears within functional limits for tasks assessed      Verbal Expression   Overall Verbal Expression Appears within functional limits for tasks assessed      Oral Motor/Sensory Function   Overall Oral Motor/Sensory Function Impaired    Labial ROM Reduced right    Labial Symmetry Abnormal symmetry right    Labial Strength Reduced Right    Labial Sensation Within Functional Limits    Labial Coordination Reduced    Lingual ROM Within Functional Limits    Lingual Symmetry Within Functional Limits    Lingual Strength Within Functional Limits    Lingual Coordination Reduced    Facial Symmetry Right droop    Facial Coordination Reduced    Overall Oral Motor/Sensory Function "The top half of the nerve on my face is gone - there's a chance I will get back some."      Motor Speech   Overall Motor Speech Impaired    Articulation Impaired    Level of Impairment Word    Intelligibility Intelligible           POs: Pt currently tolerates soft foods with thin liquids. He is staying away from sandwiches and dense  meats. Pt chews on lt side and has to drink with straw on the left side. If he has a cup he has to manually perform labial closure on rt. Rarely he reports he will need to manually "shift" food to the left that has "migrated" to the left. Pt used smaller bites and sips spontaneously.  Pt's swallow deemed functional at this time, given these modifications.   Because data states the risk for dysphagia during and after radiation treatment is high due to undergoing radiation tx, SLP taught pt about the possibility of reduced/limited ability for PO intake during rad tx. SLP encouraged pt to continue swallowing POs as far into rad tx as possible, even ingesting POs and/or completing HEP shortly after administration of pain meds. Among other modifications for days when pt cannot  functionally swallow, SLP talked about performing only non-swallowing tasks on the handout/HEP, and then adding swallowing tasks back in when it becomes possible to do so.  SLP educated pt re: changes to swallowing musculature after rad tx, and why adherence to dysphagia HEP provided today and PO consumption was necessary to inhibit muscle fibrosis following rad tx. Pt demonstrated understanding of these things to SLP.    SLP then developed a HEP for pt and pt was instructed how to perform exercises involving lingual, vocal, and pharyngeal strengthening. SLP performed each exercise and pt return demonstrated each exercise. SLP ensured pt performance was correct prior to moving on to next exercise. Pt was instructed to complete this program 2 times a day, 6-7 days/week until 6 months after his or her last rad tx, then x2-3 a week after that.                  SLP Education - 07/18/20 1258    Education Details HEP procedure, late effects head/neck radiation on swallowing ability    Person(s) Educated Patient;Spouse    Methods Explanation;Demonstration;Verbal cues;Handout    Comprehension Verbalized understanding;Returned demonstration;Verbal cues required;Need further instruction            SLP Short Term Goals - 07/18/20 1520      SLP SHORT TERM GOAL #1   Title pt will complete HEP with rare min A    Time 2    Period --   sesions, for all STGs   Status New      SLP SHORT TERM GOAL #2   Title pt will tell SLP why pt is completing HEP with modified independence    Time 2    Status New      SLP SHORT TERM GOAL #3   Title pt will describe 3 overt s/s aspiration PNA with modified independence    Time 3    Status New            SLP Long Term Goals - 07/18/20 1521      SLP LONG TERM GOAL #1   Title pt will complete HEP with modified independence over two visits    Time 4    Period --   sessions, for all LTGs   Status New      SLP LONG TERM GOAL #2   Title pt will  describe how to modify HEP over time, and the timeline associated with reduction in HEP frequency with modified independence over two sessions    Time 6    Status New            Plan - 07/18/20 1305    Clinical Impression Statement At this time  pt swallowing is deemed WFL with dys III (cheese) and thin liquid (see text/free writing above under "POs" for more details) given that pt has soft food items, bites and chews on right, takes small bites, and uses straw or pinches lt labial margin with liquids. SLP designed an individualized HEP for dysphagia and pt completed each exercise on their own with min-mod cues initially, faded to modified independent. There are no overt s/s aspiration reported by pt at this time. Data indicate that pt's swallow ability will likely decrease over the course of radiation therapy and could very well decline over time following conclusion of their radiation therapy due to muscle disuse atrophy and/or muscle fibrosis. Pt will cont to need to be seen by SLP in order to assess safety of PO intake, assess the need for recommending any objective swallow assessment, and ensuring pt correctly completes the individualized HEP.    Speech Therapy Frequency --   once every approx 4 weeks   Duration --   7 total visits   Treatment/Interventions Aspiration precaution training;Pharyngeal strengthening exercises;Diet toleration management by SLP;Trials of upgraded texture/liquids;Internal/external aids;Patient/family education;Compensatory strategies;SLP instruction and feedback    Potential to Achieve Goals Good    SLP Home Exercise Plan provided    Consulted and Agree with Plan of Care Patient           Patient will benefit from skilled therapeutic intervention in order to improve the following deficits and impairments:   Dysphagia, oral phase    Problem List Patient Active Problem List   Diagnosis Date Noted  . Malignant neoplasm of parotid gland ([place]) 07/05/2020  .  Coronary artery calcification 11/16/2018  . Pulmonary nodules 01/22/2014  . Personal history of adenomatous colonic polyps 12/24/2011  . HTN (hypertension) 04/21/2011  . Hyperlipidemia 04/21/2011    [facility] ,[place], [place]  07/18/2020, 3:32 PM  [facility] [address] [place], [place], [address] Phone: [phone]   Fax:  [phone]  Name: Geoffrey Wood MRN: [id number] Date of Birth: Dec 24, 1945

## 2020-07-18 NOTE — Therapy (Signed)
[facility] [place], [place], [address] Phone: [phone]   Fax:  [phone]  Physical Therapy Evaluation  Patient Details  Name: Geoffrey Wood MRN: [id number] Date of Birth: 21-Apr-1946 Referring Provider (PT): Reita May Date: 07/18/2020   PT End of Session - 07/18/20 0913    Visit Number 1    Number of Visits 2    Date for PT Re-Evaluation 09/12/20    PT Start Time 1119    PT Stop Time 1144    PT Time Calculation (min) 25 min    Activity Tolerance Patient tolerated treatment well    Behavior During Therapy [facility] for tasks assessed/performed           Past Medical History:  Diagnosis Date  . Adenomatous colon polyp   . Cataract   . Diabetes mellitus   . Emphysema   . Emphysema of lung ([place])   . Erectile dysfunction   . Hyperlipidemia   . Hypertension   . Peripheral vascular disease ([place])   . Pneumonia   . Pulmonary nodule     Past Surgical History:  Procedure Laterality Date  . ABDOMINAL AORTIC ANEURYSM REPAIR  2004  . COLONOSCOPY  multiple since 2004    There were no vitals filed for this visit.    Subjective Assessment - 07/18/20 0906    Subjective I feel fine.    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 No/denies    Multiple Pain Sites No              [facility] PT Assessment - 07/18/20 0001      Assessment   Medical Diagnosis carcinoma of parotid gland    Referring Provider  (PT) Isidore Moos    Onset Date/Surgical Date 05/15/20    Hand Dominance Right    Prior Therapy none      Precautions   Precautions Other (comment)    Precaution Comments active cancer      Restrictions   Weight Bearing Restrictions No      Balance Screen   Has the patient fallen in the past 6 months No    Has the patient had a decrease in activity level because of a fear of falling?  No    Is the patient reluctant to leave their home because of a fear of falling?  No      Home Ecologist residence    Living Arrangements Spouse/significant other    Available Help at Discharge Family    Type of [place] Two level      Prior Function   Level of [place] Retired    Leisure plays golf 4x/wk prior to surgery now 2x/wk, pt walks       Cognition   Overall Cognitive Status Within Functional Limits for tasks assessed      Functional Tests   Functional tests Sit to Stand      Sit to Stand   Comments 11  reps in 30 seconds which is a little below average but pt has COPD      Posture/Postural Control   Posture/Postural Control Postural limitations    Postural Limitations Rounded Shoulders;Forward head      ROM / Strength   AROM / PROM / Strength AROM      AROM   Overall AROM Comments shoulders [facility]    AROM Assessment Site Cervical    Cervical Flexion WFL    Cervical Extension WFL    Cervical - Right Side Bend WFL    Cervical - Left Side Bend WFL    Cervical - Right Rotation WFL    Cervical - Left Rotation [facility]      Ambulation/Gait   Ambulation/Gait Yes    Ambulation/Gait Assistance 7: Independent    Ambulation Distance (Feet) 15 Feet    Gait Pattern Within Functional Limits    Ambulation Surface Level             LYMPHEDEMA/ONCOLOGY QUESTIONNAIRE - 07/18/20 0001      Lymphedema Assessments   Lymphedema Assessments Head and Neck      Head and Neck   4 cm superior to sternal notch around neck  36.5 cm    6 cm superior to sternal notch around neck 37.5 cm    8 cm superior to sternal notch around neck 39 cm                   Objective measurements completed on examination: See above findings.               PT Education - 07/18/20 0918    Education Details Neck ROM, importance of posture when sitting, standing and lying down, deep breathing, walking program and importance of staying active throughout treatment, CURE article on staying active, "Why exercise?" flyer, lymphedema and PT info    Person(s) Educated Patient    Methods Explanation;Handout    Comprehension Verbalized understanding;Returned demonstration               PT Long Term Goals - 07/18/20 0914      PT LONG TERM GOAL #1   Title Pt will return to baseline ROM measurements and not demonstrate any signs of lymphedema to allow pt to return to PLOF.    Time 8    Period Weeks    Status New    Target Date 09/12/20              Head and Neck Clinic Goals - 07/18/20 0914      Patient will be able to verbalize understanding of a home exercise program for cervical range of motion, posture, and walking.    Time 1    Period Days    Status Achieved      Patient will be able to verbalize understanding of proper sitting and standing posture.    Time 1    Period Days    Status Achieved      Patient will be able to verbalize understanding of lymphedema risk and availability of treatment for this condition.    Time 1    Period Days    Status Achieved              Plan - 07/18/20 0920    Clinical Impression Statement Pt presents to PT with newly diagnosed poorly differentiated carcinoma of parotid gland. His shoulder and cervical ROM are [facility] and pt currently plays golf regularly and walks around the yard for exercise. Educated  pt about signs and symptoms of lymphedema as well as anatomy and physiology of lymphatic system. Educated pt in importance of staying as  active as possible throughout treatment to decrease fatigue as well as head and neck ROM exercises to decrease loss of ROM. Will see pt after completion of radiation to reassess ROM and assess for lymphedema to determine therapy needs at that time.    Personal Factors and Comorbidities Comorbidity 1    Comorbidities COPD    Stability/Clinical Decision Making Stable/Uncomplicated    Clinical Decision Making Low    Rehab Potential Good    PT Frequency --   eval and 1 f/u visit   PT Duration 8 weeks    PT Treatment/Interventions ADLs/Self Care Home Management;Therapeutic exercise;Patient/family education    PT Next Visit Plan reasess baselines    PT Home Exercise Plan head and neck ROM exercises    Consulted and Agree with Plan of Care Patient           Patient will benefit from skilled therapeutic intervention in order to improve the following deficits and impairments:  Postural dysfunction, Decreased knowledge of precautions  Visit Diagnosis: Abnormal posture  Malignant neoplasm of parotid gland [facility])     Problem List Patient Active Problem List   Diagnosis Date Noted  . Malignant neoplasm of parotid gland ([place]) 07/05/2020  . Coronary artery calcification 11/16/2018  . Pulmonary nodules 01/22/2014  . Personal history of adenomatous colonic polyps 12/24/2011  . HTN (hypertension) 04/21/2011  . Hyperlipidemia 04/21/2011    Allyson Sabal [facility] 07/18/2020, 11:47 AM  [place], [place], [address] Phone: [phone]   Fax:  [phone]  Name: DAMON HARGROVE MRN: [id number] Date of Birth: 1945-09-09  Manus Gunning, PT 07/18/20 11:47 AM

## 2020-07-18 NOTE — Progress Notes (Signed)
[place] CSW Progress Notes  Met w patient and wife in exam room during Head and Neck MDC.  Provided information and resources on Support Center and other sources of support during treatment.  Of note, patient states he is very hard of hearing.  Cannot wear hearing aids as result of recent surgery - they do not fit at this time.  He is able to voice his needs but struggles w hearing.  He plans to go to his audiologist today to determine if there are any viable solutions.  Edwyna Shell, LCSW Clinical Social Worker Phone:  [phone]   .

## 2020-07-19 DIAGNOSIS — C07 Malignant neoplasm of parotid gland: Secondary | ICD-10-CM | POA: Insufficient documentation

## 2020-07-22 ENCOUNTER — Ambulatory Visit
Admission: RE | Admit: 2020-07-22 | Discharge: 2020-07-22 | Disposition: A | Discharge status: Home / Self Care | Payer: Medicare Other | Source: Ambulatory Visit | Attending: Radiation Oncology | Admitting: Radiation Oncology

## 2020-07-22 ENCOUNTER — Telehealth: Payer: Self-pay

## 2020-07-22 ENCOUNTER — Other Ambulatory Visit: Payer: Self-pay

## 2020-07-22 DIAGNOSIS — C07 Malignant neoplasm of parotid gland: Secondary | ICD-10-CM | POA: Diagnosis not present

## 2020-07-22 MED ORDER — SONAFINE EX EMUL
1.0000 "application " | Freq: Two times a day (BID) | CUTANEOUS | Status: DC
  Administered 2020-07-22: 1 via TOPICAL

## 2020-07-22 NOTE — Telephone Encounter (Signed)
Left patient a message to verify telephone visit for pre reg

## 2020-07-22 NOTE — Progress Notes (Signed)

## 2020-07-23 ENCOUNTER — Ambulatory Visit
Admission: RE | Admit: 2020-07-23 | Discharge: 2020-07-23 | Disposition: A | Discharge status: Home / Self Care | Payer: Medicare Other | Source: Ambulatory Visit | Attending: Radiation Oncology | Admitting: Radiation Oncology

## 2020-07-23 ENCOUNTER — Inpatient Hospital Stay: Payer: Medicare Other | Attending: Hematology

## 2020-07-23 DIAGNOSIS — Z85828 Personal history of other malignant neoplasm of skin: Secondary | ICD-10-CM | POA: Insufficient documentation

## 2020-07-23 DIAGNOSIS — M278 Other specified diseases of jaws: Secondary | ICD-10-CM | POA: Insufficient documentation

## 2020-07-23 DIAGNOSIS — Z801 Family history of malignant neoplasm of trachea, bronchus and lung: Secondary | ICD-10-CM | POA: Insufficient documentation

## 2020-07-23 DIAGNOSIS — D649 Anemia, unspecified: Secondary | ICD-10-CM | POA: Insufficient documentation

## 2020-07-23 DIAGNOSIS — C07 Malignant neoplasm of parotid gland: Secondary | ICD-10-CM | POA: Diagnosis not present

## 2020-07-23 DIAGNOSIS — Z85810 Personal history of malignant neoplasm of tongue: Secondary | ICD-10-CM | POA: Insufficient documentation

## 2020-07-23 DIAGNOSIS — D696 Thrombocytopenia, unspecified: Secondary | ICD-10-CM | POA: Insufficient documentation

## 2020-07-23 DIAGNOSIS — Z87891 Personal history of nicotine dependence: Secondary | ICD-10-CM | POA: Insufficient documentation

## 2020-07-23 NOTE — Progress Notes (Signed)
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  S/p right parotidectomy, HTN, HLD.  Patient receiving radiation.   Spoke with patient and wife via phone for nutrition assessment.  Patient reports that he has a good appetite however eating is exhausting to him.  Mouth does not open very wide and working with SLP and performing exercises to improve that.  Chewing is a chore as well.  Typical day is cereal or bites of sausage with toast and coffee for breakfast. Lunch is chicken strips from [employer] or hamburger.  Snacks on milkshakes or cereal.  Dinner is spaghetti or chicken stew from [employer] or fried fish or [place] and dressing.  Will place sweet danish in milk for snack.  Likes 1% weight and drinks frequently during the day.  Drinks 2 high protein ensures per day.   Medications:  reviewed   Labs: reviewed   Anthropometrics:   Height: 72 inches Weight: 156 lb 6.4 oz on 11/29 173 lb 05/23/2019 BMI: 21  9% weight loss in the last year and 2 months   NUTRITION DIAGNOSIS: Inadequate oral intake and related to cancer and cancer related treatment side effects as evidenced by 9% weight loss, decrease ability to eat chewing difficulty and opening mouth   INTERVENTION:  Recommend chopping, grinding foods for ease of chewing.  Discussed ways to add more calories and protein to diet (whole milk vs 1%, adding gravies, dips, etc) Will email soft moist protein foods handouts Contact information provided    MONITORING, EVALUATION, GOAL: weight trends, intake   Next Visit: Dec 14 phone f/u  Ewen Varnell B. Zenia Resides, [place], [place] Registered Dietitian [phone] (mobile)

## 2020-07-24 ENCOUNTER — Other Ambulatory Visit: Payer: Self-pay

## 2020-07-24 ENCOUNTER — Ambulatory Visit
Admission: RE | Admit: 2020-07-24 | Discharge: 2020-07-24 | Disposition: A | Discharge status: Home / Self Care | Payer: Medicare Other | Source: Ambulatory Visit | Attending: Radiation Oncology | Admitting: Radiation Oncology

## 2020-07-24 DIAGNOSIS — C07 Malignant neoplasm of parotid gland: Secondary | ICD-10-CM | POA: Diagnosis not present

## 2020-07-25 ENCOUNTER — Telehealth: Payer: Self-pay | Admitting: Pulmonary Disease

## 2020-07-25 ENCOUNTER — Ambulatory Visit
Admission: RE | Admit: 2020-07-25 | Discharge: 2020-07-25 | Disposition: A | Discharge status: Home / Self Care | Payer: Medicare Other | Source: Ambulatory Visit | Attending: Radiation Oncology | Admitting: Radiation Oncology

## 2020-07-25 DIAGNOSIS — C07 Malignant neoplasm of parotid gland: Secondary | ICD-10-CM | POA: Diagnosis not present

## 2020-07-25 NOTE — Telephone Encounter (Signed)
Called and spoke with [name], Patient's Wife (DPR). Dr. Matilde Bash results and recommendations given. Understanding stated. Nothing further at this time.

## 2020-07-26 ENCOUNTER — Ambulatory Visit
Admission: RE | Admit: 2020-07-26 | Discharge: 2020-07-26 | Disposition: A | Discharge status: Home / Self Care | Payer: Medicare Other | Source: Ambulatory Visit | Attending: Radiation Oncology | Admitting: Radiation Oncology

## 2020-07-26 DIAGNOSIS — C07 Malignant neoplasm of parotid gland: Secondary | ICD-10-CM | POA: Diagnosis not present

## 2020-07-27 LAB — MYOMARKER 3 PLUS PROFILE (RDL)

## 2020-07-27 LAB — HYPERSENSITIVITY PNEUMONITIS
A. Pullulans Abs: NEGATIVE
A.Fumigatus #1 Abs: NEGATIVE
Micropolyspora faeni, IgG: NEGATIVE
Pigeon Serum Abs: NEGATIVE
Thermoact. Saccharii: NEGATIVE
Thermoactinomyces vulgaris, IgG: NEGATIVE

## 2020-07-29 ENCOUNTER — Ambulatory Visit
Admission: RE | Admit: 2020-07-29 | Discharge: 2020-07-29 | Disposition: A | Discharge status: Home / Self Care | Payer: Medicare Other | Source: Ambulatory Visit | Attending: Radiation Oncology | Admitting: Radiation Oncology

## 2020-07-29 ENCOUNTER — Telehealth: Payer: Self-pay

## 2020-07-29 DIAGNOSIS — C07 Malignant neoplasm of parotid gland: Secondary | ICD-10-CM | POA: Diagnosis not present

## 2020-07-29 NOTE — Telephone Encounter (Signed)
Contacted patient to verify phone visit for pre reg °

## 2020-07-30 ENCOUNTER — Ambulatory Visit
Admission: RE | Admit: 2020-07-30 | Discharge: 2020-07-30 | Disposition: A | Discharge status: Home / Self Care | Payer: Medicare Other | Source: Ambulatory Visit | Attending: Radiation Oncology | Admitting: Radiation Oncology

## 2020-07-30 ENCOUNTER — Inpatient Hospital Stay: Payer: Medicare Other

## 2020-07-30 DIAGNOSIS — C07 Malignant neoplasm of parotid gland: Secondary | ICD-10-CM | POA: Diagnosis not present

## 2020-07-30 NOTE — Progress Notes (Signed)
Nutrition Follow-up:  Patient with right parotid mass.  S/p right parotidectomy.  Patient receiving radiation.   Spoke with patient and wife via phone.  Wife reports that they received emailed information that RD sent last week.  Patient has switched to whole milk, eating more high calorie, high protein foods (milkshakes, macaroni and cheese, eggs and cheese, [employer] hamburger). Reports that he can open mouth a little bit wider.  Drinking ensure high protein 1-2 times per day.     Medications: reviewed  Labs: no new  Anthropometrics:   Weight on home scale 151 lb 4 oz per wife  Noted 12/13 153 lb 8 oz per Aria 156 lb 6.4 oz on 11/29   NUTRITION DIAGNOSIS: Inadequate oral intake improving   INTERVENTION:  Will provide samples of ensure complete and coupons for patient to try.  Samples taken to radiation so patient can pick up tomorrow at treatment.  Encouraged 2 per day between meals (350 calorie 30 g protein each). Recipes provided to patient with samples as well (smoothies, high calorie options)     MONITORING, EVALUATION, GOAL: weight trends, intake   NEXT VISIT: Dec 21 phone f/u  Ellenie Salome B. Zenia Resides, [place], [place] Registered Dietitian [phone] (mobile)

## 2020-07-31 ENCOUNTER — Other Ambulatory Visit: Payer: Self-pay

## 2020-07-31 ENCOUNTER — Ambulatory Visit
Admission: RE | Admit: 2020-07-31 | Discharge: 2020-07-31 | Disposition: A | Discharge status: Home / Self Care | Payer: Medicare Other | Source: Ambulatory Visit | Attending: Radiation Oncology | Admitting: Radiation Oncology

## 2020-07-31 DIAGNOSIS — C07 Malignant neoplasm of parotid gland: Secondary | ICD-10-CM | POA: Diagnosis not present

## 2020-08-01 ENCOUNTER — Other Ambulatory Visit: Payer: Self-pay

## 2020-08-01 ENCOUNTER — Ambulatory Visit
Admission: RE | Admit: 2020-08-01 | Discharge: 2020-08-01 | Disposition: A | Discharge status: Home / Self Care | Payer: Medicare Other | Source: Ambulatory Visit | Attending: Radiation Oncology | Admitting: Radiation Oncology

## 2020-08-01 DIAGNOSIS — C07 Malignant neoplasm of parotid gland: Secondary | ICD-10-CM | POA: Diagnosis not present

## 2020-08-02 ENCOUNTER — Ambulatory Visit
Admission: RE | Admit: 2020-08-02 | Discharge: 2020-08-02 | Disposition: A | Discharge status: Home / Self Care | Payer: Medicare Other | Source: Ambulatory Visit | Attending: Radiation Oncology | Admitting: Radiation Oncology

## 2020-08-02 DIAGNOSIS — C07 Malignant neoplasm of parotid gland: Secondary | ICD-10-CM | POA: Diagnosis not present

## 2020-08-05 ENCOUNTER — Ambulatory Visit
Admission: RE | Admit: 2020-08-05 | Discharge: 2020-08-05 | Disposition: A | Discharge status: Home / Self Care | Payer: Medicare Other | Source: Ambulatory Visit | Attending: Radiation Oncology | Admitting: Radiation Oncology

## 2020-08-05 ENCOUNTER — Telehealth: Payer: Self-pay

## 2020-08-05 DIAGNOSIS — C07 Malignant neoplasm of parotid gland: Secondary | ICD-10-CM | POA: Diagnosis not present

## 2020-08-05 NOTE — Telephone Encounter (Signed)
Contacted patient to verify telephone visit for pre reg °

## 2020-08-06 ENCOUNTER — Ambulatory Visit
Admission: RE | Admit: 2020-08-06 | Discharge: 2020-08-06 | Disposition: A | Discharge status: Home / Self Care | Payer: Medicare Other | Source: Ambulatory Visit | Attending: Radiation Oncology | Admitting: Radiation Oncology

## 2020-08-06 ENCOUNTER — Inpatient Hospital Stay: Payer: Medicare Other

## 2020-08-06 DIAGNOSIS — C07 Malignant neoplasm of parotid gland: Secondary | ICD-10-CM | POA: Diagnosis not present

## 2020-08-06 NOTE — Progress Notes (Signed)
Nutrition Follow-up:  Patient with right parotid mass.  S/p right parotedectomy.  Patient receiving radiation.   Spoke with wife via phone for nutrition follow-up.  Wife reports that patient is eating small amount of solid foods as patient having hard time chewing.  Tried ensure complete but did not like chocolate.  Has gotten a good supply of vanilla and started on Sunday drinking 3 shakes per day, plus milkshake daily and eating other solid foods.  Trying to get in SLP exercises daily.  Wife reports issues with constipation but this has resolved. Patient drinking prune juice and metamucil.  Patient playing golf about 2 times per week weather permitting.    Medications: reviewed  Labs: reviewed  Anthropometrics:   Weight 155 lb 8 oz per Aria.  153 lb 8 oz on 12/13 156 lb on 11/29    NUTRITION DIAGNOSIS: Inadequate oral intake stable   INTERVENTION:  Encouraged wife to continue to offer high calorie, high protein foods Encouraged ensure complete TID, can increase if weight drops.      MONITORING, EVALUATION, GOAL: weight trends, intake   NEXT VISIT: Jan 28 phone f/u  Geoffrey Wood, [place], [place] Registered Dietitian [phone] (mobile)

## 2020-08-07 ENCOUNTER — Ambulatory Visit
Admission: RE | Admit: 2020-08-07 | Discharge: 2020-08-07 | Disposition: A | Discharge status: Home / Self Care | Payer: Medicare Other | Source: Ambulatory Visit | Attending: Radiation Oncology | Admitting: Radiation Oncology

## 2020-08-07 ENCOUNTER — Inpatient Hospital Stay (HOSPITAL_BASED_OUTPATIENT_CLINIC_OR_DEPARTMENT_OTHER): Payer: Medicare Other | Admitting: Hematology

## 2020-08-07 ENCOUNTER — Inpatient Hospital Stay: Payer: Medicare Other

## 2020-08-07 ENCOUNTER — Other Ambulatory Visit: Payer: Self-pay

## 2020-08-07 VITALS — BP 120/65 | HR 82 | Temp 97.5°F | Resp 18 | Ht 72.0 in | Wt 156.5 lb

## 2020-08-07 DIAGNOSIS — I2510 Atherosclerotic heart disease of native coronary artery without angina pectoris: Secondary | ICD-10-CM

## 2020-08-07 DIAGNOSIS — D693 Immune thrombocytopenic purpura: Secondary | ICD-10-CM | POA: Diagnosis not present

## 2020-08-07 DIAGNOSIS — D649 Anemia, unspecified: Secondary | ICD-10-CM

## 2020-08-07 DIAGNOSIS — Z87891 Personal history of nicotine dependence: Secondary | ICD-10-CM | POA: Diagnosis not present

## 2020-08-07 DIAGNOSIS — E538 Deficiency of other specified B group vitamins: Secondary | ICD-10-CM

## 2020-08-07 DIAGNOSIS — I2584 Coronary atherosclerosis due to calcified coronary lesion: Secondary | ICD-10-CM | POA: Diagnosis not present

## 2020-08-07 DIAGNOSIS — Z85810 Personal history of malignant neoplasm of tongue: Secondary | ICD-10-CM | POA: Diagnosis not present

## 2020-08-07 DIAGNOSIS — M278 Other specified diseases of jaws: Secondary | ICD-10-CM | POA: Diagnosis not present

## 2020-08-07 DIAGNOSIS — D696 Thrombocytopenia, unspecified: Secondary | ICD-10-CM | POA: Diagnosis not present

## 2020-08-07 DIAGNOSIS — C07 Malignant neoplasm of parotid gland: Secondary | ICD-10-CM | POA: Diagnosis not present

## 2020-08-07 DIAGNOSIS — Z85828 Personal history of other malignant neoplasm of skin: Secondary | ICD-10-CM | POA: Diagnosis not present

## 2020-08-07 DIAGNOSIS — Z801 Family history of malignant neoplasm of trachea, bronchus and lung: Secondary | ICD-10-CM | POA: Diagnosis not present

## 2020-08-07 LAB — CBC WITH DIFFERENTIAL/PLATELET
Abs Immature Granulocytes: 0.31 10*3/uL — ABNORMAL HIGH (ref 0.00–0.07)
Band Neutrophils: 0 %
Basophils Absolute: 0 10*3/uL (ref 0.0–0.1)
Basophils Relative: 1 %
Blasts: 0 %
Eosinophils Absolute: 0.1 10*3/uL (ref 0.0–0.5)
Eosinophils Relative: 2 %
HCT: 33.2 % — ABNORMAL LOW (ref 39.0–52.0)
Hemoglobin: 10.7 g/dL — ABNORMAL LOW (ref 13.0–17.0)
Lymphocytes Relative: 44 %
Lymphs Abs: 1.7 10*3/uL (ref 0.7–4.0)
MCH: 24.8 pg — ABNORMAL LOW (ref 26.0–34.0)
MCHC: 32.2 g/dL (ref 30.0–36.0)
MCV: 76.9 fL — ABNORMAL LOW (ref 80.0–100.0)
Metamyelocytes Relative: 5 %
Monocytes Absolute: 0.4 10*3/uL (ref 0.1–1.0)
Monocytes Relative: 10 %
Myelocytes: 3 %
Neutro Abs: 1.4 10*3/uL — ABNORMAL LOW (ref 1.7–7.7)
Neutrophils Relative %: 35 %
Other: 0 %
Platelets: 52 10*3/uL — ABNORMAL LOW (ref 150–400)
Promyelocytes Relative: 0 %
RBC: 4.32 MIL/uL (ref 4.22–5.81)
RDW: 27.9 % — ABNORMAL HIGH (ref 11.5–15.5)
WBC: 3.9 10*3/uL — ABNORMAL LOW (ref 4.0–10.5)
nRBC: 0 /100 WBC
nRBC: 16.7 % — ABNORMAL HIGH (ref 0.0–0.2)

## 2020-08-07 LAB — CMP (CANCER CENTER ONLY)
ALT: 13 U/L (ref 0–44)
AST: 14 U/L — ABNORMAL LOW (ref 15–41)
Albumin: 3.5 g/dL (ref 3.5–5.0)
Alkaline Phosphatase: 87 U/L (ref 38–126)
Anion gap: 10 (ref 5–15)
BUN: 21 mg/dL (ref 8–23)
CO2: 25 mmol/L (ref 22–32)
Calcium: 9.3 mg/dL (ref 8.9–10.3)
Chloride: 100 mmol/L (ref 98–111)
Creatinine: 0.77 mg/dL (ref 0.61–1.24)
GFR, Estimated: >60 mL/min (ref >60)
Glucose, Bld: 112 mg/dL — ABNORMAL HIGH (ref 70–99)
Potassium: 4.5 mmol/L (ref 3.5–5.1)
Sodium: 135 mmol/L (ref 135–145)
Total Bilirubin: 1.2 mg/dL (ref 0.3–1.2)
Total Protein: 7.9 g/dL (ref 6.5–8.1)

## 2020-08-07 LAB — IMMATURE PLATELET FRACTION: Immature Platelet Fraction: 41.8 % — ABNORMAL HIGH (ref 1.2–8.6)

## 2020-08-07 LAB — SAMPLE TO BLOOD BANK

## 2020-08-07 MED ORDER — POLYSACCHARIDE IRON COMPLEX 150 MG PO CAPS: 150.0000 mg | ORAL_CAPSULE | Freq: Every day | ORAL | 3 refills | Status: DC

## 2020-08-07 NOTE — Progress Notes (Signed)
HEMATOLOGY/ONCOLOGY CLINIC NOTE  Date of Service: 08/07/2020  Patient Care Team: Crist Infante, MD as PCP - General (Internal Medicine) Nahser, Wonda Cheng, MD as PCP - Cardiology (Cardiology)  CHIEF COMPLAINTS/PURPOSE OF CONSULTATION:  Anemia and Thrombocytopenia  HISTORY OF PRESENTING ILLNESS:  Geoffrey Wood is a wonderful 74 y.o. male who has been referred to [place] by Caprice Beaver, AG-NP for evaluation and management of anemia and thrombocytopenia. Pt is accompanied today by his wife. The pt reports that he is doing well overall.   The pt reports that he was not told of his thrombocytopenia prior to his Parotidectomy. Pt first noticed enlargement of his parotid gland in August of this year. 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 They plan on grafting skin over the previously excised area, then completing radiation therapy for positive margins. Pt was started on Celebrex post-op and has already discontinued. He was diagnosed with double pneumonia last week when increased shortness of breath was observed. Pt was given a Z Pack and Augmentin. He has been completing these medications as prescribed.   Prior to the Parotidectomy Dr. Joylene Draft lowered his Lipitor and Synjardy dosages by half. He denies any other medication changes prior to his surgery. Pt was previously told about a Vitamin B12 deficiency and was placed on a B12 supplement by Dr. Joylene Draft.   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   Most recent lab results (06/14/2020) of CBC is as follows: all values are WNL except for RBC at 3.68, Hgb at 9.7, HCT at 29.9, RDW at 28.0, PLT at 70.K, nRBC at 8.9, Abs Immature Granulocytes at 0.21K, Sodium at 131, Glucose at 138, Calcium at 8.5. 05/31/2020 Hgb at 11.0, PLT at 41K.  On review of systems, pt reports delicate skin, bruising and denies  bloody/black stools, hematuria, nose bleeds, gum bleeds and any other symptoms.   On PMHx the pt reports Diabetes, Emphysema, HLD, HTN, Peripheral vascular disease, Tongue cancer, Skin cancer. On Social Hx the pt reports that he was a previous 50+ year smoker. He drinks alcohol a few times per year. On Family Hx the pt reports no known bleeding or blood clotting disorders.   INTERVAL HISTORY:  Geoffrey Wood is a wonderful 74 y.o. male who is here for evaluation and management of anemia and thrombocytopenia. We are joined today by his wife. The patient's last visit with [place] was on 06/24/2020. The pt reports that he is doing well overall.  The pt reports that his FLAP procedure went well and did not cause excessive bleeding. Pt has completed 13 radiation treatments so far. Pt has been unable to regain weight despite using nutritional supplements and following the instructions of his Nutritionist. He has jaw fatigue while eating and often has to stop before he is full. He becomes short of breath at night and has to breathe through his mouth. Pt is taking Vitamin B12 under the advisement of Dr. Joylene Draft. He is also taking 18 mg Ferrous Fumarate daily. Pt is using OTC Tylenol for minor pain relief and continues to avoid Ibuprofen.   Lab results today (08/07/20) of CBC w/diff and CMP is as follows: all values are WNL except for WBC at 3.9K, Hgb at 10.7, HCT at 33.2, MCV at 76.9, MCH at 24.8, RDW at 27.9, PLT at 52K, nRBC at 16.7, Neutro Abs at  1.4K, Abs Immature Granulocytes at 0.31K, Glucose at 112, AST at 14. 08/07/2020 Immature PLT Fract at 41.8  On review of systems, pt reports shortness of breath, constipation and denies fatigue, abdominal pain, dysuria and any other symptoms.   MEDICAL HISTORY:  Past Medical History:  Diagnosis Date  . Adenomatous colon polyp   . Cataract   . Diabetes mellitus   . Emphysema   . Emphysema of lung ([place])   . Erectile dysfunction   . Hyperlipidemia   .  Hypertension   . Peripheral vascular disease ([place])   . Pneumonia   . Pulmonary nodule     SURGICAL HISTORY: Past Surgical History:  Procedure Laterality Date  . ABDOMINAL AORTIC ANEURYSM REPAIR  2004  . COLONOSCOPY  multiple since 2004    SOCIAL HISTORY: Social History   Socioeconomic History  . Marital status: Married    Spouse name: Not on file  . Number of children: Not on file  . Years of education: Not on file  . Highest education level: Not on file  Occupational History  . Occupation: Scientist, clinical (histocompatibility and immunogenetics): Designer, television/film set  Tobacco Use  . Smoking status: Former Smoker    Packs/day: 1.50    Years: 60.00    Pack years: 90.00    Types: Cigarettes    Quit date: 09/17/2014    Years since quitting: 5.8  . Smokeless tobacco: Never Used  Vaping Use  . Vaping Use: Never used  Substance and Sexual Activity  . Alcohol use: Yes    Comment: not even 1 drink a week  . Drug use: No  . Sexual activity: Not Currently  Other Topics Concern  . Not on file  Social History Narrative  . Not on file   Social Determinants of Health   Financial Resource Strain: Not on file  Food Insecurity: No Food Insecurity  . Worried About Charity fundraiser in the Last Year: Never true  . Ran Out of Food in the Last Year: Never true  Transportation Needs: No Transportation Needs  . Lack of Transportation (Medical): No  . Lack of Transportation (Non-Medical): No  Physical Activity: Not on file  Stress: Not on file  Social Connections: Not on file  Intimate Partner Violence: Not on file    FAMILY HISTORY: Family History  Problem Relation Age of Onset  . Heart failure Mother   . Diabetes Mother   . Cancer Father        lung  . Colon cancer Neg Hx   . Esophageal cancer Neg Hx   . Liver cancer Neg Hx   . Pancreatic cancer Neg Hx   . Rectal cancer Neg Hx   . Stomach cancer Neg Hx     ALLERGIES:  has No Known Allergies.  MEDICATIONS:  Current Outpatient Medications   Medication Sig Dispense Refill  . acetaminophen (TYLENOL) 325 MG tablet Take 325 mg by mouth every 6 (six) hours as needed for mild pain.    [name] 62.5-25 MCG/INH AEPB Take 1 puff by mouth daily.    . Ascorbic Acid (VITAMIN C) 500 MG CAPS Take 1 capsule by mouth 2 (two) times daily.    . Cholecalciferol (VITAMIN D PO) Take 800 Units by mouth daily.     . Cyanocobalamin (VITAMIN B 12 PO) Take 500 mcg by mouth daily.     . ERGOCALCIFEROL PO Take 800 Units by mouth daily.    Marland Kitchen erythromycin ophthalmic ointment Place 1 application  into the right eye at bedtime.    Marland Kitchen ezetimibe (ZETIA) 10 MG tablet Take 10 mg by mouth daily.     . iron polysaccharides (NIFEREX) 150 MG capsule Take 1 capsule (150 mg total) by mouth daily. 30 capsule 3  . LIPITOR 80 MG tablet Take 40 mg by mouth Daily.     . Melatonin 10 MG TABS Take 10 mg by mouth at bedtime as needed (sleep).    . niacin 500 MG tablet Take 500 mg by mouth 4 (four) times daily.     Glory Rosebush VERIO test strip     . SYNJARDY 12.12-998 MG TABS Take 1 tablet by mouth daily.      Current Facility-Administered Medications  Medication Dose Route Frequency Provider Last Rate Last Admin  . 0.9 %  sodium chloride infusion  500 mL Intravenous Once Gatha Mayer, MD        REVIEW OF SYSTEMS:   A 10+ POINT REVIEW OF SYSTEMS WAS OBTAINED including neurology, dermatology, psychiatry, cardiac, respiratory, lymph, extremities, GI, GU, Musculoskeletal, constitutional, breasts, reproductive, HEENT.  All pertinent positives are noted in the HPI.  All others are negative.  PHYSICAL EXAMINATION: ECOG PERFORMANCE STATUS: 1 - Symptomatic but completely ambulatory  . Vitals:   08/07/20 1459  BP: 120/65  Pulse: 82  Resp: 18  Temp: (!) 97.5 F (36.4 C)  SpO2: 93%   Filed Weights   08/07/20 1459  Weight: 156 lb 8 oz (71 kg)   .Body mass index is 21.23 kg/m.   Exam was given in a chair   GENERAL:alert, in no acute distress and  comfortable SKIN: no acute rashes, no significant lesions EYES: conjunctiva are pink and non-injected, sclera anicteric OROPHARYNX: MMM, no exudates, no oropharyngeal erythema or ulceration NECK: supple, no JVD LYMPH:  no palpable lymphadenopathy in the cervical, axillary or inguinal regions LUNGS: clear to auscultation b/l with normal respiratory effort HEART: regular rate & rhythm ABDOMEN:  normoactive bowel sounds , non tender, not distended. No palpable hepatosplenomegaly.  Extremity: no pedal edema PSYCH: alert & oriented x 3 with fluent speech NEURO: no focal motor/sensory deficits  LABORATORY DATA:  I have reviewed the data as listed  . CBC Latest Ref Rng & Units 08/07/2020 07/15/2020 06/18/2020  WBC 4.0 - 10.5 K/uL 3.9(L) 6.3 5.5  Hemoglobin 13.0 - 17.0 g/dL 10.7(L) 11.0(L) 11.0(L)  Hematocrit 39.0 - 52.0 % 33.2(L) 33.1(L) 34.1(L)  Platelets 150 - 400 K/uL 52(L) 80.0(L) 62(L)    . CMP Latest Ref Rng & Units 08/07/2020 06/18/2020 06/14/2020  Glucose 70 - 99 mg/dL 112(H) 108(H) 138(H)  BUN 8 - 23 mg/dL 21 16 13   Creatinine 0.61 - 1.24 mg/dL 0.77 0.78 0.77  Sodium 135 - 145 mmol/L 135 132(L) 131(L)  Potassium 3.5 - 5.1 mmol/L 4.5 4.2 4.1  Chloride 98 - 111 mmol/L 100 99 99  CO2 22 - 32 mmol/L 25 26 22   Calcium 8.9 - 10.3 mg/dL 9.3 9.3 8.5(L)  Total Protein 6.5 - 8.1 g/dL 7.9 7.7 -  Total Bilirubin 0.3 - 1.2 mg/dL 1.2 1.3(H) -  Alkaline Phos 38 - 126 U/L 87 85 -  AST 15 - 41 U/L 14(L) 17 -  ALT 0 - 44 U/L 13 23 -     RADIOGRAPHIC STUDIES: I have personally reviewed the radiological images as listed and agreed with the findings in the report. DG Chest 2 View  Result Date: 07/15/2020 CLINICAL DATA:  Shortness of breath. EXAM: CHEST - 2 VIEW COMPARISON:  PET scan of May 15, 2020. CT scan of April 16, 2020. FINDINGS: The heart size and mediastinal contours are within normal limits. No pneumothorax or pleural effusion is noted. Diffuse interstitial densities are  noted throughout both lungs most consistent with chronic interstitial lung disease or scarring. The visualized skeletal structures are unremarkable. IMPRESSION: Diffuse interstitial densities are noted throughout both lungs most consistent with chronic interstitial lung disease or scarring. Electronically Signed   By: Marijo Conception M.D.   On: 07/15/2020 12:44    ASSESSMENT & PLAN:   1. Anemia  2. Thromobcytopenia  3. Right Parotid Carcinoma: -Radiation therapy is set for positive margins  -Will defer management to ENT and RadOnc    PLAN: -Discussed pt labwork today, 08/07/20; all values are WNL except for WBC at 3.9K, Hgb at 10.7, HCT at 33.2, MCV at 76.9, MCH at 24.8, RDW at 27.9, PLT at 52K, nRBC at 16.7, Neutro Abs at 1.4K, Abs Immature Granulocytes at 0.31K, Glucose at 112, AST at 14. -Discussed 08/07/2020 Immature PLT Fract at 41.8 -Advised pt that we would absolutely move to treat if PLT <30K. -Discussed starting IVIG vs. watching with labs - pt prefers to watch at this time. -Recommend pt eat softer foods to prevent jaw fatigue. -Advised pt that Iron Polysaccharide is typically tolerated well.  -Continue daily Vitamin B12 and PO Iron and begin daily B-complex. -Will check iron levels with next labs -Will see back in 5-6 weeks with labs   -Rx Iron Polysaccharide     FOLLOW UP: RTC with Dr Irene Limbo with labs in 5-6 weeks   The total time spent in the appt was 30 minutes and more than 50% was on counseling and direct patient cares.  All of the patient's questions were answered with apparent satisfaction. The patient knows to call the clinic with any problems, questions or concerns.    Sullivan Lone MD Olivia Lopez de Gutierrez AAHIVMS [facility] Hematology/Oncology Physician [facility]  (Office):       [phone] (Work cell):  [phone] (Fax):           [phone]  08/07/2020 4:36 PM  I, Yevette Edwards, am acting as a scribe for Dr. Sullivan Lone.   .I have reviewed the  above documentation for accuracy and completeness, and I agree with the above. Brunetta Genera MD

## 2020-08-08 ENCOUNTER — Ambulatory Visit
Admission: RE | Admit: 2020-08-08 | Discharge: 2020-08-08 | Disposition: A | Discharge status: Home / Self Care | Payer: Medicare Other | Source: Ambulatory Visit | Attending: Radiation Oncology | Admitting: Radiation Oncology

## 2020-08-08 DIAGNOSIS — C07 Malignant neoplasm of parotid gland: Secondary | ICD-10-CM | POA: Diagnosis not present

## 2020-08-12 ENCOUNTER — Ambulatory Visit
Admission: RE | Admit: 2020-08-12 | Discharge: 2020-08-12 | Disposition: A | Discharge status: Home / Self Care | Payer: Medicare Other | Source: Ambulatory Visit | Attending: Radiation Oncology | Admitting: Radiation Oncology

## 2020-08-12 ENCOUNTER — Other Ambulatory Visit: Payer: Self-pay

## 2020-08-12 DIAGNOSIS — C07 Malignant neoplasm of parotid gland: Secondary | ICD-10-CM | POA: Diagnosis not present

## 2020-08-13 ENCOUNTER — Other Ambulatory Visit: Payer: Self-pay

## 2020-08-13 ENCOUNTER — Ambulatory Visit
Admission: RE | Admit: 2020-08-13 | Discharge: 2020-08-13 | Disposition: A | Discharge status: Home / Self Care | Payer: Medicare Other | Source: Ambulatory Visit | Attending: Radiation Oncology | Admitting: Radiation Oncology

## 2020-08-13 ENCOUNTER — Inpatient Hospital Stay: Payer: Medicare Other

## 2020-08-13 DIAGNOSIS — C07 Malignant neoplasm of parotid gland: Secondary | ICD-10-CM | POA: Diagnosis not present

## 2020-08-13 NOTE — Progress Notes (Signed)
Nutrition Follow-up:   Patient with right parotid mass, s/p right parotedectomy.  Patient receiving radiation.   Spoke with patient and wife via phone for nutrition follow-up.  Wife reports that on home scale patient weighs every Monday and yesterday was 149 lb (weighs naked, first thing in am).  Wife upset about weight loss.  Wife has been tracking calories for the last 2 days.  Likes an oreo milkshake from [employer] that has 800 calories, drinking whole milk, ate 1/2 bun, all hamburger patty from [employer] today, cereal, ensure complete times 2.  Patient has played golf several times last week.      Medications: reviewed  Labs: reviewed  Anthropometrics:   Weight 156 lb 8 oz on 12/22 155 lb 8 oz on 12/20 per Aria 153 lb 8 oz on 12/13 156 lb on 11/29   Estimated Energy Needs  Kcals: 2100-2485 Protein: 105-124 g Fluid: 2.1 L  NUTRITION DIAGNOSIS: Inadequate oral intake stable   INTERVENTION:  Encouraged keeping food diary to meet calorie goals each day.   Continue ensure complete shakes daily TID (1050 calories, 90 g protein)    MONITORING, EVALUATION, GOAL: weight trends, intake   NEXT VISIT: Jan 4 phone f/u  Tapanga [place] B. Freida Busman, RD, LDN Registered Dietitian [phone] (mobile)

## 2020-08-14 ENCOUNTER — Other Ambulatory Visit: Payer: Self-pay

## 2020-08-14 ENCOUNTER — Ambulatory Visit
Admission: RE | Admit: 2020-08-14 | Discharge: 2020-08-14 | Disposition: A | Discharge status: Home / Self Care | Payer: Medicare Other | Source: Ambulatory Visit | Attending: Radiation Oncology | Admitting: Radiation Oncology

## 2020-08-14 DIAGNOSIS — C07 Malignant neoplasm of parotid gland: Secondary | ICD-10-CM | POA: Diagnosis not present

## 2020-08-15 ENCOUNTER — Other Ambulatory Visit: Payer: Self-pay

## 2020-08-15 ENCOUNTER — Ambulatory Visit
Admission: RE | Admit: 2020-08-15 | Discharge: 2020-08-15 | Disposition: A | Discharge status: Home / Self Care | Payer: Medicare Other | Source: Ambulatory Visit | Attending: Radiation Oncology | Admitting: Radiation Oncology

## 2020-08-15 ENCOUNTER — Telehealth: Payer: Self-pay | Admitting: Hematology

## 2020-08-15 DIAGNOSIS — C07 Malignant neoplasm of parotid gland: Secondary | ICD-10-CM | POA: Diagnosis not present

## 2020-08-15 NOTE — Telephone Encounter (Signed)
Scheduled per los, spoke with patient's wife and patient will be notified of upcoming appointment.

## 2020-08-19 ENCOUNTER — Other Ambulatory Visit: Payer: Self-pay | Admitting: Radiation Oncology

## 2020-08-19 ENCOUNTER — Other Ambulatory Visit: Payer: Self-pay

## 2020-08-19 ENCOUNTER — Telehealth: Payer: Self-pay

## 2020-08-19 ENCOUNTER — Ambulatory Visit
Admission: RE | Admit: 2020-08-19 | Discharge: 2020-08-19 | Disposition: A | Discharge status: Home / Self Care | Payer: Medicare Other | Source: Ambulatory Visit | Attending: Radiation Oncology | Admitting: Radiation Oncology

## 2020-08-19 DIAGNOSIS — C07 Malignant neoplasm of parotid gland: Secondary | ICD-10-CM | POA: Diagnosis present

## 2020-08-19 MED ORDER — LIDOCAINE VISCOUS HCL 2 % MT SOLN: OROMUCOSAL | 3 refills | Status: DC

## 2020-08-19 NOTE — Telephone Encounter (Signed)
Contacted patient to verify phone visit for pre reg °

## 2020-08-20 ENCOUNTER — Ambulatory Visit
Admission: RE | Admit: 2020-08-20 | Discharge: 2020-08-20 | Disposition: A | Discharge status: Home / Self Care | Payer: Medicare Other | Source: Ambulatory Visit | Attending: Radiation Oncology | Admitting: Radiation Oncology

## 2020-08-20 ENCOUNTER — Inpatient Hospital Stay: Payer: Medicare Other | Attending: Hematology

## 2020-08-20 ENCOUNTER — Other Ambulatory Visit: Payer: Self-pay

## 2020-08-20 DIAGNOSIS — C07 Malignant neoplasm of parotid gland: Secondary | ICD-10-CM | POA: Diagnosis not present

## 2020-08-20 DIAGNOSIS — D649 Anemia, unspecified: Secondary | ICD-10-CM | POA: Insufficient documentation

## 2020-08-20 DIAGNOSIS — D696 Thrombocytopenia, unspecified: Secondary | ICD-10-CM | POA: Insufficient documentation

## 2020-08-20 NOTE — Progress Notes (Signed)
Nutrition Follow-up:  Patient with right parotid mass, s/p surgery.  Patient receiving radiation.   Spoke with patient and wife via phone for nutrition follow-up.  Wife reports that patient is drinking ensure complete shakes, milkshakes, grilled cheese sandwiches and averaging about 2700-2800 calories per day.  Reports patient with dry mouth, decreased taste and sore throat. Wife says that patient used viscous lidocaine last night and helped.  Patient is getting biotene today to help with dry mouth and wife had gotten him so gum.     Medications: reviewed  Labs: reviewed  Anthropometrics:   Weight 154 lb 6 oz on 1/3 per Aria 156 lb 8 oz on 12/22 155 lb 8 oz on 12/20 per Aria 153 lb 8 oz on 12/13 156 lb on 11/29   NUTRITION DIAGNOSIS: Inadequate oral intake stable   INTERVENTION:  Discussed strategies to help with dry mouth Patient to continue to consume high calorie and protein food items to maintain weight    MONITORING, EVALUATION, GOAL: weight trends, intake   NEXT VISIT: Jan 11 phone call  Shyloh Derosa B. Freida Busman, RD, LDN Registered Dietitian [phone] (mobile)

## 2020-08-21 ENCOUNTER — Ambulatory Visit
Admission: RE | Admit: 2020-08-21 | Discharge: 2020-08-21 | Disposition: A | Discharge status: Home / Self Care | Payer: Medicare Other | Source: Ambulatory Visit | Attending: Radiation Oncology | Admitting: Radiation Oncology

## 2020-08-21 DIAGNOSIS — C07 Malignant neoplasm of parotid gland: Secondary | ICD-10-CM | POA: Diagnosis not present

## 2020-08-22 ENCOUNTER — Ambulatory Visit: Payer: Medicare Other

## 2020-08-22 ENCOUNTER — Ambulatory Visit
Admission: RE | Admit: 2020-08-22 | Discharge: 2020-08-22 | Disposition: A | Discharge status: Home / Self Care | Payer: Medicare Other | Source: Ambulatory Visit | Attending: Radiation Oncology | Admitting: Radiation Oncology

## 2020-08-22 ENCOUNTER — Other Ambulatory Visit: Payer: Self-pay

## 2020-08-22 DIAGNOSIS — C07 Malignant neoplasm of parotid gland: Secondary | ICD-10-CM | POA: Diagnosis not present

## 2020-08-23 ENCOUNTER — Ambulatory Visit
Admission: RE | Admit: 2020-08-23 | Discharge: 2020-08-23 | Disposition: A | Discharge status: Home / Self Care | Payer: Medicare Other | Source: Ambulatory Visit | Attending: Radiation Oncology | Admitting: Radiation Oncology

## 2020-08-23 ENCOUNTER — Other Ambulatory Visit: Payer: Self-pay

## 2020-08-23 DIAGNOSIS — C07 Malignant neoplasm of parotid gland: Secondary | ICD-10-CM | POA: Diagnosis not present

## 2020-08-26 ENCOUNTER — Encounter: Payer: Self-pay | Admitting: Hematology

## 2020-08-26 ENCOUNTER — Other Ambulatory Visit: Payer: Self-pay

## 2020-08-26 ENCOUNTER — Telehealth: Payer: Self-pay

## 2020-08-26 ENCOUNTER — Ambulatory Visit
Admission: RE | Admit: 2020-08-26 | Discharge: 2020-08-26 | Disposition: A | Discharge status: Home / Self Care | Payer: Medicare Other | Source: Ambulatory Visit | Attending: Radiation Oncology | Admitting: Radiation Oncology

## 2020-08-26 DIAGNOSIS — C07 Malignant neoplasm of parotid gland: Secondary | ICD-10-CM | POA: Diagnosis not present

## 2020-08-26 NOTE — Telephone Encounter (Signed)
Contacted patient to verify telephone visit for pre reg °

## 2020-08-27 ENCOUNTER — Inpatient Hospital Stay: Payer: Medicare Other

## 2020-08-27 ENCOUNTER — Encounter: Payer: Self-pay | Admitting: Hematology

## 2020-08-27 ENCOUNTER — Telehealth: Payer: Self-pay | Admitting: *Deleted

## 2020-08-27 ENCOUNTER — Other Ambulatory Visit: Payer: Self-pay

## 2020-08-27 ENCOUNTER — Ambulatory Visit
Admission: RE | Admit: 2020-08-27 | Discharge: 2020-08-27 | Disposition: A | Discharge status: Home / Self Care | Payer: Medicare Other | Source: Ambulatory Visit | Attending: Radiation Oncology | Admitting: Radiation Oncology

## 2020-08-27 DIAGNOSIS — C07 Malignant neoplasm of parotid gland: Secondary | ICD-10-CM | POA: Diagnosis not present

## 2020-08-27 NOTE — Progress Notes (Signed)
Nutrition Follow-up:  Patient with right parotid mass, s/p surgery.  Patient receiving radiation.   Spoke with wife.  Wife reports that patient is eating on average ~2945 calories per day. Intake most made up of ensure complete (~2/day) milkshakes 2 a day (600-800 calories), whole milk, cereal. Sometimes able to eat peanut butter crackers, pizza, spaghetti and meatballs, cinnamon bun in milk and ice cream.  No taste.      Medications: adding miralax  Labs: reviewed  Anthropometrics:   Weight 152 lb 4 oz on 1/10 decreased from 154 lb 6 oz on 1/3 per Aria 156 lb 8 oz on 12/22 155 lb 8 oz on 12/20 153 lb 8 oz on 12/13 156 lb on 11/29  NUTRITION DIAGNOSIS: Inadequate oral intake continues with treatment side effects   INTERVENTION:  Continue to encourage high calories and protein during this last week of treatment to maintain weight    MONITORING, EVALUATION, GOAL: weight trends, intake   NEXT VISIT: Jan 18 phone f/u  Aleka Twitty B. Zenia Resides, [place], [place] Registered Dietitian [phone] (mobile)

## 2020-08-27 NOTE — Telephone Encounter (Signed)
Patient had labs done at Dr. Perini/PCP yesterday. Platelets 8,000. Patient wants to know what Dr.Kale advises and if he wants him to have labs repeated at [facility]. Can't see PCP lab results in Epic to know if platelets clumped. Patient's wife said Dr.Perini sent Dr. Irene Limbo a message. Dr.Kale informed. Per Dr. Irene Limbo, no message received at this time from Dr. Joylene Draft regarding lab tests.   Contacted patient 's wife with Dr. Grier Mitts response: Not able to know if platelet clumping on outside labs-- he will need to go to the ED to have labs repeated and receive platelets if needed.  He can be seen in hospital. Patient's wife verbalized understanding and states they will go to [facility] ED.

## 2020-08-28 ENCOUNTER — Other Ambulatory Visit: Payer: Self-pay | Admitting: *Deleted

## 2020-08-28 ENCOUNTER — Inpatient Hospital Stay: Payer: Medicare Other

## 2020-08-28 ENCOUNTER — Telehealth: Payer: Self-pay | Admitting: *Deleted

## 2020-08-28 ENCOUNTER — Ambulatory Visit
Admission: RE | Admit: 2020-08-28 | Discharge: 2020-08-28 | Disposition: A | Discharge status: Home / Self Care | Payer: Medicare Other | Source: Ambulatory Visit | Attending: Radiation Oncology | Admitting: Radiation Oncology

## 2020-08-28 ENCOUNTER — Other Ambulatory Visit: Payer: Self-pay

## 2020-08-28 DIAGNOSIS — D649 Anemia, unspecified: Secondary | ICD-10-CM

## 2020-08-28 DIAGNOSIS — D693 Immune thrombocytopenic purpura: Secondary | ICD-10-CM

## 2020-08-28 DIAGNOSIS — C07 Malignant neoplasm of parotid gland: Secondary | ICD-10-CM | POA: Diagnosis not present

## 2020-08-28 LAB — CMP (CANCER CENTER ONLY)
ALT: 18 U/L (ref 0–44)
AST: 16 U/L (ref 15–41)
Albumin: 3.1 g/dL — ABNORMAL LOW (ref 3.5–5.0)
Alkaline Phosphatase: 80 U/L (ref 38–126)
Anion gap: 6 (ref 5–15)
BUN: 18 mg/dL (ref 8–23)
CO2: 26 mmol/L (ref 22–32)
Calcium: 9 mg/dL (ref 8.9–10.3)
Chloride: 101 mmol/L (ref 98–111)
Creatinine: 0.85 mg/dL (ref 0.61–1.24)
GFR, Estimated: >60 mL/min (ref >60)
Glucose, Bld: 193 mg/dL — ABNORMAL HIGH (ref 70–99)
Potassium: 4.3 mmol/L (ref 3.5–5.1)
Sodium: 133 mmol/L — ABNORMAL LOW (ref 135–145)
Total Bilirubin: 1.5 mg/dL — ABNORMAL HIGH (ref 0.3–1.2)
Total Protein: 7.8 g/dL (ref 6.5–8.1)

## 2020-08-28 LAB — CBC WITH DIFFERENTIAL (CANCER CENTER ONLY)
Abs Immature Granulocytes: 0.27 10*3/uL — ABNORMAL HIGH (ref 0.00–0.07)
Basophils Absolute: 0 10*3/uL (ref 0.0–0.1)
Basophils Relative: 1 %
Eosinophils Absolute: 0 10*3/uL (ref 0.0–0.5)
Eosinophils Relative: 1 %
HCT: 32.6 % — ABNORMAL LOW (ref 39.0–52.0)
Hemoglobin: 10.4 g/dL — ABNORMAL LOW (ref 13.0–17.0)
Immature Granulocytes: 9 %
Lymphocytes Relative: 20 %
Lymphs Abs: 0.6 10*3/uL — ABNORMAL LOW (ref 0.7–4.0)
MCH: 24.9 pg — ABNORMAL LOW (ref 26.0–34.0)
MCHC: 31.9 g/dL (ref 30.0–36.0)
MCV: 78.2 fL — ABNORMAL LOW (ref 80.0–100.0)
Monocytes Absolute: 0.4 10*3/uL (ref 0.1–1.0)
Monocytes Relative: 12 %
Neutro Abs: 1.7 10*3/uL (ref 1.7–7.7)
Neutrophils Relative %: 57 %
Platelet Count: 51 10*3/uL — ABNORMAL LOW (ref 150–400)
RBC: 4.17 MIL/uL — ABNORMAL LOW (ref 4.22–5.81)
RDW: 28.3 % — ABNORMAL HIGH (ref 11.5–15.5)
WBC Count: 3 10*3/uL — ABNORMAL LOW (ref 4.0–10.5)
nRBC: 12.7 % — ABNORMAL HIGH (ref 0.0–0.2)

## 2020-08-28 LAB — SAMPLE TO BLOOD BANK

## 2020-08-28 LAB — IMMATURE PLATELET FRACTION: Immature Platelet Fraction: 48 % — ABNORMAL HIGH (ref 1.2–8.6)

## 2020-08-28 NOTE — Telephone Encounter (Signed)
Patient had labs repeated this morning at [place]. Dr. Irene Limbo reviewed results. Patient PLT are 51,000. Per Dr. Irene Limbo, patient can go home at this time and will be seen at appt on 2/1. Patient/wife given information and instructed to call office for further concerns.

## 2020-08-28 NOTE — Telephone Encounter (Signed)
Contacted patient with these instructions from Dr. Erling Conte lab appointment for this morning to recheck labs: cbc/diff, immat plt fraction, cmp, hold tube for BB and setup for 1 unit of plts today. Will look at labs to determine if we are going with steroids +/- IVIG.  Patient/wife verbalized understanding  - appt made for labs this morning.

## 2020-08-29 ENCOUNTER — Ambulatory Visit
Admission: RE | Admit: 2020-08-29 | Discharge: 2020-08-29 | Disposition: A | Discharge status: Home / Self Care | Payer: Medicare Other | Source: Ambulatory Visit | Attending: Radiation Oncology | Admitting: Radiation Oncology

## 2020-08-29 ENCOUNTER — Other Ambulatory Visit: Payer: Self-pay

## 2020-08-29 DIAGNOSIS — C07 Malignant neoplasm of parotid gland: Secondary | ICD-10-CM | POA: Diagnosis not present

## 2020-08-30 ENCOUNTER — Other Ambulatory Visit: Payer: Self-pay

## 2020-08-30 ENCOUNTER — Ambulatory Visit
Admission: RE | Admit: 2020-08-30 | Discharge: 2020-08-30 | Disposition: A | Discharge status: Home / Self Care | Payer: Medicare Other | Source: Ambulatory Visit | Attending: Radiation Oncology | Admitting: Radiation Oncology

## 2020-08-30 DIAGNOSIS — C07 Malignant neoplasm of parotid gland: Secondary | ICD-10-CM | POA: Diagnosis not present

## 2020-09-02 ENCOUNTER — Ambulatory Visit
Admission: RE | Admit: 2020-09-02 | Discharge: 2020-09-02 | Disposition: A | Discharge status: Home / Self Care | Payer: Medicare Other | Source: Ambulatory Visit | Attending: Radiation Oncology | Admitting: Radiation Oncology

## 2020-09-02 ENCOUNTER — Telehealth: Payer: Self-pay

## 2020-09-02 ENCOUNTER — Other Ambulatory Visit: Payer: Self-pay

## 2020-09-02 DIAGNOSIS — C07 Malignant neoplasm of parotid gland: Secondary | ICD-10-CM | POA: Diagnosis not present

## 2020-09-02 NOTE — Telephone Encounter (Signed)
Contacted patient to verify telephone visit for pre reg °

## 2020-09-03 ENCOUNTER — Other Ambulatory Visit: Payer: Self-pay

## 2020-09-03 ENCOUNTER — Ambulatory Visit
Admission: RE | Admit: 2020-09-03 | Discharge: 2020-09-03 | Disposition: A | Discharge status: Home / Self Care | Payer: Medicare Other | Source: Ambulatory Visit | Attending: Radiation Oncology | Admitting: Radiation Oncology

## 2020-09-03 ENCOUNTER — Inpatient Hospital Stay: Payer: Medicare Other

## 2020-09-03 DIAGNOSIS — C07 Malignant neoplasm of parotid gland: Secondary | ICD-10-CM | POA: Diagnosis not present

## 2020-09-03 NOTE — Progress Notes (Signed)
Nutrition Follow-up:  Patient with right parotid mass, s/p surgery.  Patient receiving radiation.   Spoke with wife for nutrition follow-up.  Wife reports average calorie intake this past week of ~2700 calories per day.  Mostly liquid calories and soft solids.  Patient continues with no taste.     Medications: reviewed  Labs: reviewed  Anthropometrics:  Weight 152 lb per Aria on 1/10 154 lb 6 oz on 1/3 156 lb 8 oz on 12/22 156 lb 8 oz on 12/20 153 lb 8 oz on 12/13 156 lb on 11/29   NUTRITION DIAGNOSIS: Inadequate oral intake continues with treatment side effects   INTERVENTION:  Patient to continue high calorie, high protein intake to help with recovery and healing.       MONITORING, EVALUATION, GOAL: weight trends, intake   NEXT VISIT: Feb 15 phone call f/u  Kyanna Mahrt B. Zenia Resides, [place], [place] Registered Dietitian [phone] (mobile)

## 2020-09-04 ENCOUNTER — Ambulatory Visit
Admission: RE | Admit: 2020-09-04 | Discharge: 2020-09-04 | Disposition: A | Discharge status: Home / Self Care | Payer: Medicare Other | Source: Ambulatory Visit | Attending: Radiation Oncology | Admitting: Radiation Oncology

## 2020-09-04 DIAGNOSIS — C07 Malignant neoplasm of parotid gland: Secondary | ICD-10-CM | POA: Diagnosis not present

## 2020-09-05 ENCOUNTER — Ambulatory Visit
Admission: RE | Admit: 2020-09-05 | Discharge: 2020-09-05 | Disposition: A | Discharge status: Home / Self Care | Payer: Medicare Other | Source: Ambulatory Visit | Attending: Radiation Oncology | Admitting: Radiation Oncology

## 2020-09-05 DIAGNOSIS — C07 Malignant neoplasm of parotid gland: Secondary | ICD-10-CM | POA: Diagnosis not present

## 2020-09-06 ENCOUNTER — Ambulatory Visit
Admission: RE | Admit: 2020-09-06 | Discharge: 2020-09-06 | Disposition: A | Discharge status: Home / Self Care | Payer: Medicare Other | Source: Ambulatory Visit | Attending: Radiation Oncology | Admitting: Radiation Oncology

## 2020-09-06 ENCOUNTER — Encounter: Payer: Self-pay | Admitting: Radiation Oncology

## 2020-09-06 ENCOUNTER — Other Ambulatory Visit: Payer: Self-pay

## 2020-09-06 DIAGNOSIS — C07 Malignant neoplasm of parotid gland: Secondary | ICD-10-CM | POA: Diagnosis not present

## 2020-09-06 NOTE — Progress Notes (Signed)
Oncology Nurse Navigator Documentation  Met with Mr. Southwell after final RT to offer support and to celebrate end of radiation treatment.   Provided verbal post-RT guidance:  Importance of keeping all follow-up appts, especially those with Nutrition and SLP.  Importance of protecting treatment area from sun.  Continuation of Sonafine application 2-3 times daily, application of antibiotic ointment to areas of raw skin; when supply of Sonafine exhausted transition to OTC lotion with vitamin E.  Explained my role as navigator will continue for several more months, encouraged him to call me with needs/concerns.    [name] RN, BSN, OCN Head & Neck Oncology Nurse [place] at [facility] Phone # [phone]  Fax # [phone]

## 2020-09-09 ENCOUNTER — Ambulatory Visit: Payer: Medicare Other | Attending: Radiation Oncology

## 2020-09-09 ENCOUNTER — Other Ambulatory Visit: Payer: Self-pay

## 2020-09-09 DIAGNOSIS — R1311 Dysphagia, oral phase: Secondary | ICD-10-CM | POA: Diagnosis present

## 2020-09-09 NOTE — Patient Instructions (Signed)
Signs of Aspiration Pneumonia   . Chest pain/tightness . Fever (can be low grade) . Cough  o With foul-smelling phlegm (sputum) o With sputum containing pus or blood o With greenish sputum . Fatigue  . Shortness of breath  . Wheezing   **IF YOU HAVE THESE SIGNS, CONTACT YOUR DOCTOR OR GO TO THE EMERGENCY DEPARTMENT OR URGENT CARE AS SOON AS POSSIBLE**      

## 2020-09-09 NOTE — Therapy (Signed)
[facility] [address] [place], [place], [address] Phone: [phone]   Fax:  [phone]  Speech Language Pathology Treatment  Patient Details  Name: Geoffrey Wood MRN: [id number] Date of Birth: 1945-09-04 Referring Provider (SLP): Eppie Gibson, MD   Encounter Date: 09/09/2020   End of Session - 09/09/20 1610    Visit Number 2    Number of Visits 7    Date for SLP Re-Evaluation 10/16/20   90 daya   SLP Start Time 1321    SLP Stop Time  1400    SLP Time Calculation (min) 39 min    Activity Tolerance Patient tolerated treatment well           Past Medical History:  Diagnosis Date  . Adenomatous colon polyp   . Cataract   . Diabetes mellitus   . Emphysema   . Emphysema of lung ([place])   . Erectile dysfunction   . Hyperlipidemia   . Hypertension   . Peripheral vascular disease ([place])   . Pneumonia   . Pulmonary nodule     Past Surgical History:  Procedure Laterality Date  . ABDOMINAL AORTIC ANEURYSM REPAIR  2004  . COLONOSCOPY  multiple since 2004    There were no vitals filed for this visit.   Subjective Assessment - 09/09/20 1324    Subjective Pt is 140 lb, normally approx 170 lb.    Patient is accompained by: Family member   wife   Currently in Pain? No/denies                 ADULT SLP TREATMENT - 09/09/20 1325      General Information   Behavior/Cognition Alert;Cooperative;Pleasant mood      Treatment Provided   Treatment provided Dysphagia      Dysphagia Treatment   Temperature Spikes Noted No    Respiratory Status Room air    Oral Cavity - Dentition Missing dentition    Treatment Methods Skilled observation;Patient/caregiver education;Therapeutic exercise    Patient observed directly with PO's Yes    Type of PO's observed Nectar-thick liquids;Thin liquids    Oral Phase Signs & Symptoms Anterior loss/spillage;Prolonged bolus formation    Pharyngeal Phase Signs & Symptoms --    none observed today   Other treatment/comments Pt cont with dysarhtira due to oral motor nerve involvement. Pt wife/pt agree pt is not coughing with high calorie high protein liquids - which is all pt is consuming at this time. Pt is not coughing with water in session today x8 sips. SLP encouraged pt/wife to touch base with dietician if they have concern about pt meeting caloric intake -wife stated she would call dietician upon exit from [place] session. Pt performed HEP with min A x1 ([place] - for hold).      Dysphagia Recommendations   Diet recommendations --   as tolerated   Liquids provided via Cup;Straw    Medication Administration --   as tolerated   Compensations --   holding lt labial margin to avoid labial leakage     Progression Toward Goals   Progression toward goals Progressing toward goals            SLP Education - 09/09/20 1610    Education Details s/sx aspiration PNA, ocntact dietician with concerns about weight loss    Person(s) Educated Patient;Spouse    Methods Explanation    Comprehension Verbalized understanding            SLP Short Term  Goals - 09/09/20 1612      SLP SHORT TERM GOAL #1   Title pt will complete HEP with rare min A    Period --   sesions, for all STGs   Status Achieved      SLP SHORT TERM GOAL #2   Title pt will tell SLP why pt is completing HEP with modified independence    Status Achieved      SLP SHORT TERM GOAL #3   Title pt will describe 3 overt s/s aspiration PNA with modified independence    Time 2    Status On-going            SLP Long Term Goals - 09/09/20 1613      SLP LONG TERM GOAL #1   Title pt will complete HEP with modified independence over two visits    Time 3    Period --   sessions, for all LTGs   Status On-going      SLP LONG TERM GOAL #2   Title pt will describe how to modify HEP over time, and the timeline associated with reduction in HEP frequency with modified independence over two sessions    Time 5     Status On-going            Plan - 09/09/20 1611    Clinical Impression Statement At this time pt swallowing is deemed [facility] with water and compensation of pinching lt labial margin with liquids. SLP reviewed Sarath's individualized HEP for dysphagia and pt completed each exercise on their own with min cuesx1, faded to modified independent. There are no overt s/s aspiration reported by pt at this time. Data indicate that pt's swallow ability will likely decrease over the course of radiation therapy and could very well decline over time following conclusion of their radiation therapy due to muscle disuse atrophy and/or muscle fibrosis. Pt will cont to need to be seen by SLP in order to assess safety of PO intake, assess the need for recommending any objective swallow assessment, and ensuring pt correctly completes the individualized HEP.    Speech Therapy Frequency --   once every approx 4 weeks   Duration --   7 total visits   Treatment/Interventions Aspiration precaution training;Pharyngeal strengthening exercises;Diet toleration management by SLP;Trials of upgraded texture/liquids;Internal/external aids;Patient/family education;Compensatory strategies;SLP instruction and feedback    Potential to Achieve Goals Good    SLP Home Exercise Plan provided    Consulted and Agree with Plan of Care Patient           Patient will benefit from skilled therapeutic intervention in order to improve the following deficits and impairments:   Dysphagia, oral phase    Problem List Patient Active Problem List   Diagnosis Date Noted  . 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 Coronary artery calcification 11/16/2018  . Pulmonary nodules 01/22/2014  . Personal history of adenomatous colonic polyps 12/24/2011  . HTN (hypertension) 04/21/2011  . Hyperlipidemia 04/21/2011    [facility] ,MS, CCC-SLP  09/09/2020, 4:13 PM  [name] [address] [place], [place], [address] Phone: [phone]   Fax:  [phone]   Name: Geoffrey Wood MRN: [id number] Date of Birth: [birth year]

## 2020-09-10 ENCOUNTER — Telehealth: Payer: Self-pay | Admitting: *Deleted

## 2020-09-10 ENCOUNTER — Telehealth: Payer: Self-pay

## 2020-09-10 NOTE — Telephone Encounter (Signed)
Nutrition  RD received message that patient's wife left on RD voicemail on 09/09/2020.  RD was out of the office on 1/24.  RD called wife back today.  Wife weighs patient weekly on home scale and concerned that he had lost 5 lb from last week.  Reports that she has been recording calories that patient eats daily and on average last week eating about 2400 calories/day.  Wife and patient saw PCP yesterday afternoon and several medications were stopped.  Remeron started. Wife said that in the next 10 days if weight did not turn around PCP discussed possibility of feeding tube placement.  Family member told them about benecal (unflavored 330 calories, 7 g protein) to add to foods.  Wife said patient ordered some last night and will try.    Review of weight loss in [place] reveals 8.2 lb weight loss from 12/13-1/18 5% weight loss in the last month.   Intervention: Continue to encourage high calorie, high protein foods daily to prevent weight loss.  Agree with consideration of feeding tube placement if weight continues to drop Wife has RD contact information  Brittish Bolinger B. Zenia Resides, [place], [place] Registered Dietitian [phone] (mobile)

## 2020-09-10 NOTE — Telephone Encounter (Signed)
Patient wife called. Patient not eating or drinking enough due to difficulty swallowing. Patient losing weight steadily. Patient saw Dr. Joylene Draft yesterday - med changes made. Dr. Joylene Draft advised him to call Dr. Irene Limbo and [name] (pt completed Radiation last week) and ask to be evaluated for a feeding tube. Weight in October 164 - now 132.  Dr. Irene Limbo informed: per Dr Irene Limbo - Dr Isidore Moos is managing RT related considerations and he is following for his cytopenias...will let her take the lead on this. WIll f/u with patient as currently scheduled next Wednesday. Recommend contacting Head and Neck nurse navigator. Contacted patient's wife with this information. She verbalized understanding.  Message sent to Nurse navigator with all information provided by patient's wife.

## 2020-09-11 NOTE — Progress Notes (Signed)
Oncology Nurse Navigator Documentation  Mr. Tuccillo wife called the [facility] concerned about his weight loss after completing radiation recently. I notified [name] of her concerns and then called her back with Dr. Pearlie Oyster recommendations. [name] recommended that he continue to boost calories and protein and to continue seeing the Dietician at [facility] for assistance. [name] will see him on 09/20/20 for follow up and assess how he is doing and decide if he needs further intervention at that time. I also told Mrs. Sarwar that if she felt like he was declining further or felt like he was dehydrated to call me and I would arrange for him to see [name] sooner or receive IVF.   [name] RN, BSN, OCN Head & Neck Oncology Nurse [place] at [facility] Phone # [phone]  Fax # [phone]

## 2020-09-16 NOTE — Progress Notes (Signed)
HEMATOLOGY/ONCOLOGY CLINIC NOTE  Date of Service: 09/16/2020  Patient Care Team: Crist Infante, MD as PCP - General (Internal Medicine) Nahser, Wonda Cheng, MD as PCP - Cardiology (Cardiology)  CHIEF COMPLAINTS/PURPOSE OF CONSULTATION:  Anemia and Thrombocytopenia  HISTORY OF PRESENTING ILLNESS:  Geoffrey Wood is a wonderful 75 y.o. male who has been referred to [place] by Caprice Beaver, AG-NP for evaluation and management of anemia and thrombocytopenia. Pt is accompanied today by his wife. The pt reports that he is doing well overall.   The pt reports that he was not told of his thrombocytopenia prior to his Parotidectomy. Pt first noticed enlargement of his parotid gland in August of this year. 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 They plan on grafting skin over the previously excised area, then completing radiation therapy for positive margins. Pt was started on Celebrex post-op and has already discontinued. He was diagnosed with double pneumonia last week when increased shortness of breath was observed. Pt was given a Z Pack and Augmentin. He has been completing these medications as prescribed.   Prior to the Parotidectomy Dr. Joylene Draft lowered his Lipitor and Synjardy dosages by half. He denies any other medication changes prior to his surgery. Pt was previously told about a Vitamin B12 deficiency and was placed on a B12 supplement by Dr. Joylene Draft.   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   Most recent lab results (06/14/2020) of CBC is as follows: all values are WNL except for RBC at 3.68, Hgb at 9.7, HCT at 29.9, RDW at 28.0, PLT at 70.K, nRBC at 8.9, Abs Immature Granulocytes at 0.21K, Sodium at 131, Glucose at 138, Calcium at 8.5. 05/31/2020 Hgb at 11.0, PLT at 41K.  On review of systems, pt reports delicate skin, bruising and denies  bloody/black stools, hematuria, nose bleeds, gum bleeds and any other symptoms.   On PMHx the pt reports Diabetes, Emphysema, HLD, HTN, Peripheral vascular disease, Tongue cancer, Skin cancer. On Social Hx the pt reports that he was a previous 50+ year smoker. He drinks alcohol a few times per year. On Family Hx the pt reports no known bleeding or blood clotting disorders.   INTERVAL HISTORY:  Geoffrey Wood is a wonderful 75 y.o. male who is here for evaluation and management of anemia and thrombocytopenia. We are joined today by his wife. The patient's last visit with [place] was on 08/07/2020. The pt reports that he is doing well overall.  The pt reports no new symptoms or concerns. He is still dealing with trouble hearing that has been worsened since his skin graft and issues with chewing. The pt experienced lack of taste buds that are slowly improving. The pt's wife notes that he is currently eating and tolerating 3000 calories daily. He still experiences dry mouth. He is using Lidocaine for his sore throat and difficulty swallowing.  The pt notes that 2 days ago he bumped his knee, which led to some bleeding. This clotted fairly quickly with help of bandaid. This was not spontaneous.  The pt notes that he has a Pulmonology function test in 2 weeks, as well as a scan.  The pt notes that he is still taking his Vitamin B12, Iron Polysaccharide once daily, B-Complex daily. The pt has recently been taking off Niacin, Zetia, and two other medications.   Lab results  today 09/17/2020 of CBC w/diff and CMP is as follows: all values are WNL except for Glucose of 165, Albumin of 3.0, WBC of 2.6K, RBC of 3.79, Hgb of 9.7, HCT of 29.7, MCV of 78.4, MCH of 25.6, RDW of 28.8, Plt of 53K, nRBC of 6.4. 09/17/2020 Ferritin in 201 09/17/2020 Iron and TIBC _ iron saturation 15% 09/17/2020 Vitamin B12 is 1115   On review of systems, pt reports throat pain, difficulty swallowing and denies spontaneous bleeding,  back pain, changes in bowel movements, abdominal pain, and any other symptoms.  MEDICAL HISTORY:  Past Medical History:  Diagnosis Date  . Adenomatous colon polyp   . Cataract   . Diabetes mellitus   . Emphysema   . Emphysema of lung ([place])   . Erectile dysfunction   . Hyperlipidemia   . Hypertension   . Peripheral vascular disease ([place])   . Pneumonia   . Pulmonary nodule     SURGICAL HISTORY: Past Surgical History:  Procedure Laterality Date  . ABDOMINAL AORTIC ANEURYSM REPAIR  2004  . COLONOSCOPY  multiple since 2004    SOCIAL HISTORY: Social History   Socioeconomic History  . Marital status: Married    Spouse name: Not on file  . Number of children: Not on file  . Years of education: Not on file  . Highest education level: Not on file  Occupational History  . Occupation: Scientist, clinical (histocompatibility and immunogenetics): Designer, television/film set  Tobacco Use  . Smoking status: Former Smoker    Packs/day: 1.50    Years: 60.00    Pack years: 90.00    Types: Cigarettes    Quit date: 09/17/2014    Years since quitting: 6.0  . Smokeless tobacco: Never Used  Vaping Use  . Vaping Use: Never used  Substance and Sexual Activity  . Alcohol use: Yes    Comment: not even 1 drink a week  . Drug use: No  . Sexual activity: Not Currently  Other Topics Concern  . Not on file  Social History Narrative  . Not on file   Social Determinants of Health   Financial Resource Strain: Not on file  Food Insecurity: No Food Insecurity  . Worried About Charity fundraiser in the Last Year: Never true  . Ran Out of Food in the Last Year: Never true  Transportation Needs: No Transportation Needs  . Lack of Transportation (Medical): No  . Lack of Transportation (Non-Medical): No  Physical Activity: Not on file  Stress: Not on file  Social Connections: Not on file  Intimate Partner Violence: Not on file    FAMILY HISTORY: Family History  Problem Relation Age of Onset  . Heart failure Mother   .  Diabetes Mother   . Cancer Father        lung  . Colon cancer Neg Hx   . Esophageal cancer Neg Hx   . Liver cancer Neg Hx   . Pancreatic cancer Neg Hx   . Rectal cancer Neg Hx   . Stomach cancer Neg Hx     ALLERGIES:  has No Known Allergies.  MEDICATIONS:  Current Outpatient Medications  Medication Sig Dispense Refill  . acetaminophen (TYLENOL) 325 MG tablet Take 325 mg by mouth every 6 (six) hours as needed for mild pain.    [name] 62.5-25 MCG/INH AEPB Take 1 puff by mouth daily.    . Ascorbic Acid (VITAMIN C) 500 MG CAPS Take 1 capsule by mouth 2 (two) times  daily.    . Cholecalciferol (VITAMIN D PO) Take 800 Units by mouth daily.     . Cyanocobalamin (VITAMIN B 12 PO) Take 500 mcg by mouth daily.     . ERGOCALCIFEROL PO Take 800 Units by mouth daily.    Marland Kitchen erythromycin ophthalmic ointment Place 1 application into the right eye at bedtime.    Marland Kitchen ezetimibe (ZETIA) 10 MG tablet Take 10 mg by mouth daily.     . iron polysaccharides (NIFEREX) 150 MG capsule Take 1 capsule (150 mg total) by mouth daily. 30 capsule 3  . lidocaine (XYLOCAINE) 2 % solution Patient: Mix 1part 2% viscous lidocaine, 1part H20. Swish & swallow 77m of diluted mixture, 393m before meals and at bedtime, up to QID prn soreness 200 mL 3  . LIPITOR 80 MG tablet Take 40 mg by mouth Daily.     . Melatonin 10 MG TABS Take 10 mg by mouth at bedtime as needed (sleep).    . niacin 500 MG tablet Take 500 mg by mouth 4 (four) times daily.     . Glory RosebushERIO test strip     . SYNJARDY 12.12-998 MG TABS Take 1 tablet by mouth daily.      Current Facility-Administered Medications  Medication Dose Route Frequency Provider Last Rate Last Admin  . 0.9 %  sodium chloride infusion  500 mL Intravenous Once GeGatha MayerMD        REVIEW OF SYSTEMS:   10 Point review of Systems was done is negative except as noted above.  PHYSICAL EXAMINATION: ECOG PERFORMANCE STATUS: 1 - Symptomatic but completely  ambulatory  . Vitals:   09/17/20 1334  BP: (!) 111/58  Pulse: 76  Resp: 18  Temp: (!) 97 F (36.1 C)  SpO2: 92%   Filed Weights   09/17/20 1334  Weight: 145 lb 9.6 oz (66 kg)   .Body mass index is 19.75 kg/m.   Exam was given in a chair.  GENERAL:alert, in no acute distress and comfortable SKIN: no acute rashes, no significant lesions EYES: conjunctiva are pink and non-injected, sclera anicteric OROPHARYNX: MMM NECK: supple, no JVD LYMPH:  no palpable lymphadenopathy in the cervical, axillary or inguinal regions LUNGS: clear to auscultation b/l with normal respiratory effort HEART: regular rate & rhythm ABDOMEN:  normoactive bowel sounds , non tender, not distended. Extremity: no pedal edema PSYCH: alert & oriented x 3 with fluent speech NEURO: no focal motor/sensory deficits   LABORATORY DATA:  I have reviewed the data as listed  . CBC Latest Ref Rng & Units 09/17/2020 08/28/2020 08/07/2020  WBC 4.0 - 10.5 K/uL 2.6(L) 3.0(L) 3.9(L)  Hemoglobin 13.0 - 17.0 g/dL 9.7(L) 10.4(L) 10.7(L)  Hematocrit 39.0 - 52.0 % 29.7(L) 32.6(L) 33.2(L)  Platelets 150 - 400 K/uL 53(L) 51(L) 52(L)    . CMP Latest Ref Rng & Units 09/17/2020 08/28/2020 08/07/2020  Glucose 70 - 99 mg/dL 165(H) 193(H) 112(H)  BUN 8 - 23 mg/dL _0 Creatinine 0.61 - 1.24 mg/dL 0.84 0.85 0.77  Sodium 135 - 145 mmol/L 135 133(L) 135  Potassium 3.5 - 5.1 mmol/L 4.2 4.3 4.5  Chloride 98 - 111 mmol/L 101 101 100  CO2 22 - 32 mmol/L _1 Calcium 8.9 - 10.3 mg/dL 9.0 9.0 9.3  Total Protein 6.5 - 8.1 g/dL 7.5 7.8 7.9  Total Bilirubin 0.3 - 1.2 mg/dL 1.2 1.5(H) 1.2  Alkaline Phos 38 - 126 U/L 82 80 87  AST 15 - 41  U/L 16 16 14(L)  ALT 0 - 44 U/L _0 Component     Latest Ref Rng & Units 09/17/2020  Iron     42 - 163 ug/dL 30 (L)  TIBC     202 - 409 ug/dL 199 (L)  Saturation Ratios     20 - 55 % 15 (L)  UIBC     117 - 376 ug/dL 168  Vitamin B12     180 - 914 pg/mL 1,115 (H)  Ferritin      24 - 336 ng/mL 201      RADIOGRAPHIC STUDIES: I have personally reviewed the radiological images as listed and agreed with the findings in the report. No results found.  ASSESSMENT & PLAN:   75 yo with pancytopenia in the context of being treated for cancer with surgery and radiation.  1. Anemia - multifactorial.  Likely from blood loss with recent surgeries + anemia of chronic inflammation + radiation + nutritional challenges in the setting of surgery and radiation for cell carcinoma   2. Thrombcytopenia  3. Leucopenia - primarily lymphopenia -- likely related to Radiation therapy.  3. Right Parotid Carcinoma: presented to Dr. Lucia Gaskins on 05/02/2020 with a developing right-sided parotid mass.  This was in the setting of a previous history of skin cancer on the right side of his scalp that was removed surgically 3 years ago.  He also had a history of a T1 tongue cancer, left side, that was removed surgically 9 years ago. Fine needle aspiration was performed at that time of the right parotid and revealed malignant cells consistent with non-small cell carcinoma.   -s/p surgery with subsequent flap closure. Right parotidectomy on 06/03/2020 revealed poorly differentiated carcinoma that measured up to 6 cm in greatest dimension. Lymphovascular invasion was present.  No mention of positive or negative perineural invasion.  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 Peripheral and deep linked margin of the specimen was positive for tumor. Right level 2A lymph nodes (14), right level 2B lymph nodes (9), and tissue in glenoid fossa were all negative for malignancy.   -s/p Radiation therapy for positive margins  -Will defer management to ENT and RadOnc   4.  Moderate protein calorie malnutrition -being followed by nutritional therapy.  Weight is starting to stabilize. . Wt Readings from Last 3 Encounters:   09/20/20 148 lb 3.2 oz (67.2 kg)  09/17/20 145 lb 9.6 oz (66 kg)  08/07/20 156 lb 8 oz (71 kg)     PLAN: -Discussed pt labwork today, 09/17/2020; electrolyte levels stable, protein levels decreased, mild anemia, Plt stable at 50K range. Blood counts lower and red cells smaller in size.  -Advised pt the radiation and blood loss with surgery could cause anemia. Advised that they should slowly begin to improve following 6-8 weeks. -Advised pt that taste buds recover within 6-12 weeks due to radiation. -Discussed continuing to monitor Plt versus IV immunoglobulins. The pt wishes this about this. -Advised pt that Plt are borderline needing treatment. Will continue to monitor levels due to being lower. -Advise pt we will monitor again prior to Bm Bx due to reversible outside factors that could be affecting counts.  If worsening counts despite addressing nutritional status, recovery from radiation, iron and B vitamin replacements and IVIG might need to consider bone marrow biopsy to rule out MDS. -Advise pt of low-dose Prednisone to help boost  appetite and WBC. Will hold off at this time. -ferritin 201 with iron saturation of 15% -- will hold off on IV Iron currently -Continue daily Vitamin B12, B-Complex, and Iron Polysaccharide. -Will see back in 2 months with labs. Will get labs in 1 month.   FOLLOW UP: Labs in 1 month RTC with Dr Irene Limbo with labs in 2 months     Sullivan Lone MD [place] AAHIVMS [facility] Hematology/Oncology Physician [facility]  (Office):       [phone] (Work cell):  [phone] (Fax):           [phone]  09/16/2020 5:54 PM  I, Reinaldo Raddle, am acting as scribe for Dr. Sullivan Lone, MD.    .I have reviewed the above documentation for accuracy and completeness, and I agree with the above. 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  Marland KitchenBrunetta Genera MD    The total time spent in the appointment was 30 minutes and more than 50% was on counseling and direct patient cares.  All of the patient's questions were answered with apparent satisfaction. The patient knows to call the clinic with any problems, questions or concerns.

## 2020-09-17 ENCOUNTER — Inpatient Hospital Stay: Payer: Medicare Other | Attending: Hematology

## 2020-09-17 ENCOUNTER — Encounter: Payer: Self-pay | Admitting: Hematology

## 2020-09-17 ENCOUNTER — Other Ambulatory Visit: Payer: Self-pay

## 2020-09-17 ENCOUNTER — Inpatient Hospital Stay (HOSPITAL_BASED_OUTPATIENT_CLINIC_OR_DEPARTMENT_OTHER): Payer: Medicare Other | Admitting: Hematology

## 2020-09-17 VITALS — BP 111/58 | HR 76 | Temp 97.0°F | Resp 18 | Ht 72.0 in | Wt 145.6 lb

## 2020-09-17 DIAGNOSIS — D649 Anemia, unspecified: Secondary | ICD-10-CM

## 2020-09-17 DIAGNOSIS — Z8701 Personal history of pneumonia (recurrent): Secondary | ICD-10-CM | POA: Diagnosis not present

## 2020-09-17 DIAGNOSIS — Z86010 Personal history of colonic polyps: Secondary | ICD-10-CM | POA: Insufficient documentation

## 2020-09-17 DIAGNOSIS — D61818 Other pancytopenia: Secondary | ICD-10-CM | POA: Diagnosis present

## 2020-09-17 DIAGNOSIS — Z87891 Personal history of nicotine dependence: Secondary | ICD-10-CM | POA: Insufficient documentation

## 2020-09-17 DIAGNOSIS — Z85810 Personal history of malignant neoplasm of tongue: Secondary | ICD-10-CM | POA: Insufficient documentation

## 2020-09-17 DIAGNOSIS — H9190 Unspecified hearing loss, unspecified ear: Secondary | ICD-10-CM | POA: Insufficient documentation

## 2020-09-17 DIAGNOSIS — Z801 Family history of malignant neoplasm of trachea, bronchus and lung: Secondary | ICD-10-CM | POA: Diagnosis not present

## 2020-09-17 DIAGNOSIS — D693 Immune thrombocytopenic purpura: Secondary | ICD-10-CM

## 2020-09-17 DIAGNOSIS — C07 Malignant neoplasm of parotid gland: Secondary | ICD-10-CM | POA: Diagnosis present

## 2020-09-17 DIAGNOSIS — Z85828 Personal history of other malignant neoplasm of skin: Secondary | ICD-10-CM | POA: Diagnosis not present

## 2020-09-17 DIAGNOSIS — D72819 Decreased white blood cell count, unspecified: Secondary | ICD-10-CM | POA: Diagnosis not present

## 2020-09-17 DIAGNOSIS — E538 Deficiency of other specified B group vitamins: Secondary | ICD-10-CM

## 2020-09-17 DIAGNOSIS — E440 Moderate protein-calorie malnutrition: Secondary | ICD-10-CM | POA: Diagnosis not present

## 2020-09-17 LAB — CBC WITH DIFFERENTIAL/PLATELET
Abs Immature Granulocytes: 0.13 10*3/uL — ABNORMAL HIGH (ref 0.00–0.07)
Basophils Absolute: 0 10*3/uL (ref 0.0–0.1)
Basophils Relative: 1 %
Eosinophils Absolute: 0 10*3/uL (ref 0.0–0.5)
Eosinophils Relative: 1 %
HCT: 29.7 % — ABNORMAL LOW (ref 39.0–52.0)
Hemoglobin: 9.7 g/dL — ABNORMAL LOW (ref 13.0–17.0)
Immature Granulocytes: 5 %
Lymphocytes Relative: 24 %
Lymphs Abs: 0.6 10*3/uL — ABNORMAL LOW (ref 0.7–4.0)
MCH: 25.6 pg — ABNORMAL LOW (ref 26.0–34.0)
MCHC: 32.7 g/dL (ref 30.0–36.0)
MCV: 78.4 fL — ABNORMAL LOW (ref 80.0–100.0)
Monocytes Absolute: 0.4 10*3/uL (ref 0.1–1.0)
Monocytes Relative: 13 %
Neutro Abs: 1.5 10*3/uL — ABNORMAL LOW (ref 1.7–7.7)
Neutrophils Relative %: 56 %
Platelets: 53 10*3/uL — ABNORMAL LOW (ref 150–400)
RBC: 3.79 MIL/uL — ABNORMAL LOW (ref 4.22–5.81)
RDW: 28.8 % — ABNORMAL HIGH (ref 11.5–15.5)
WBC: 2.6 10*3/uL — ABNORMAL LOW (ref 4.0–10.5)
nRBC: 6.4 % — ABNORMAL HIGH (ref 0.0–0.2)

## 2020-09-17 LAB — IRON AND TIBC
Iron: 30 ug/dL — ABNORMAL LOW (ref 42–163)
Saturation Ratios: 15 % — ABNORMAL LOW (ref 20–55)
TIBC: 199 ug/dL — ABNORMAL LOW (ref 202–409)
UIBC: 168 ug/dL (ref 117–376)

## 2020-09-17 LAB — VITAMIN B12: Vitamin B-12: 1115 pg/mL — ABNORMAL HIGH (ref 180–914)

## 2020-09-17 LAB — CMP (CANCER CENTER ONLY)
ALT: 15 U/L (ref 0–44)
AST: 16 U/L (ref 15–41)
Albumin: 3 g/dL — ABNORMAL LOW (ref 3.5–5.0)
Alkaline Phosphatase: 82 U/L (ref 38–126)
Anion gap: 6 (ref 5–15)
BUN: 14 mg/dL (ref 8–23)
CO2: 28 mmol/L (ref 22–32)
Calcium: 9 mg/dL (ref 8.9–10.3)
Chloride: 101 mmol/L (ref 98–111)
Creatinine: 0.84 mg/dL (ref 0.61–1.24)
GFR, Estimated: >60 mL/min (ref >60)
Glucose, Bld: 165 mg/dL — ABNORMAL HIGH (ref 70–99)
Potassium: 4.2 mmol/L (ref 3.5–5.1)
Sodium: 135 mmol/L (ref 135–145)
Total Bilirubin: 1.2 mg/dL (ref 0.3–1.2)
Total Protein: 7.5 g/dL (ref 6.5–8.1)

## 2020-09-17 LAB — FERRITIN: Ferritin: 201 ng/mL (ref 24–336)

## 2020-09-18 ENCOUNTER — Other Ambulatory Visit: Payer: Self-pay

## 2020-09-18 ENCOUNTER — Ambulatory Visit: Payer: Medicare Other | Attending: Radiation Oncology | Admitting: Physical Therapy

## 2020-09-18 ENCOUNTER — Encounter: Payer: Self-pay | Admitting: Physical Therapy

## 2020-09-18 DIAGNOSIS — R1310 Dysphagia, unspecified: Secondary | ICD-10-CM | POA: Diagnosis present

## 2020-09-18 DIAGNOSIS — C07 Malignant neoplasm of parotid gland: Secondary | ICD-10-CM | POA: Insufficient documentation

## 2020-09-18 DIAGNOSIS — R293 Abnormal posture: Secondary | ICD-10-CM | POA: Insufficient documentation

## 2020-09-18 NOTE — Therapy (Signed)
[place], [place], [address] Phone: [phone]   Fax:  [phone]  Physical Therapy Treatment  Patient Details  Name: Geoffrey Wood MRN: [id number] Date of Birth: 11-Mar-1946 Referring Provider (PT): Reita May Date: 09/18/2020   PT End of Session - 09/18/20 1431    Visit Number 2    Number of Visits 2    Date for PT Re-Evaluation 09/12/20    PT Start Time 1402    PT Stop Time 1425    PT Time Calculation (min) 23 min    Activity Tolerance Patient tolerated treatment well    Behavior During Therapy [facility] for tasks assessed/performed           Past Medical History:  Diagnosis Date  . Adenomatous colon polyp   . Cataract   . Diabetes mellitus   . Emphysema   . Emphysema of lung ([place])   . Erectile dysfunction   . Hyperlipidemia   . Hypertension   . Peripheral vascular disease ([place])   . Pneumonia   . Pulmonary nodule     Past Surgical History:  Procedure Laterality Date  . ABDOMINAL AORTIC ANEURYSM REPAIR  2004  . COLONOSCOPY  multiple since 2004    There were no vitals filed for this visit.   Subjective Assessment - 09/18/20 1404    Subjective I am not having any trouble just trying to get over the side effects of the radiation. I don't have any taste, smell, dry mouth. Trying to gain weight. I don't have any trouble turning my head and I have not noticed any swelling in my neck. I have been doing my exercises throughout that you gave me.    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 No/denies              [facility] PT Assessment - 09/18/20 0001      Balance Screen   Has the patient fallen in the past 6 months No    Has the patient had a decrease in activity level because of a fear of falling?  No    Is the patient reluctant to leave their home because of a fear of falling?  No      Prior Function   Level of Independence Independent    Vocation Retired    Leisure plays golf 4x/wk prior to surgery now 2x/wk, pt walks       Cognition   Overall Cognitive Status Within Functional Limits for tasks assessed      Observation/Other Assessments   Observations bandage behind and in front of ear with some bleeding noted due to radiation burn      Sit to Stand   Comments 11 reps in 30 seconds which is a little below average but pt has COPD      AROM   Overall AROM Comments shoulders WFL    Cervical Flexion WFL    Cervical Extension WFL    Cervical - Right Side Bend 25% limited    Cervical -  Left Side Bend WFL    Cervical - Right Rotation [facility]    Cervical - Left Rotation WFL             LYMPHEDEMA/ONCOLOGY QUESTIONNAIRE - 09/18/20 0001      Head and Neck   4 cm superior to sternal notch around neck 35 cm    6 cm superior to sternal notch around neck 35.2 cm    8 cm superior to sternal notch around neck 35.6 cm                                   PT Long Term Goals - 09/18/20 1430      PT LONG TERM GOAL #1   Title Pt will return to baseline ROM measurements and not demonstrate any signs of lymphedema to allow pt to return to PLOF.    Time 8    Period Weeks    Status Achieved                 Plan - 09/18/20 1431    Clinical Impression Statement Pt returns to PT following completion of radiation about 12 days ago for poorly differentiated carcinoma of parotid gland. He has open wounds surrounding his ear that are still  healing with dressings in place. Reassessed shoulder and cervical ROM and both are [facility] except for R cervical lateral flexion which is about 25% limited. Pt has been consistent in doing his stretches 2-3x/day. Educated pt to continue with this. He has been feeling fatigued but his wife states his hemoglobin is very low. Educated pt that if he continues to feel fatigued in a month or so to get another referral to return to PT. Pt does not demonstrate any signs and symptoms of lymphedema. Pt will be discharged from skilled PT services at this time.    PT Frequency --   eval and 1 f/u visit   PT Duration 8 weeks    PT Treatment/Interventions ADLs/Self Care Home Management;Therapeutic exercise;Patient/family education    PT Next Visit Plan d/c this visit    PT Home Exercise Plan head and neck ROM exercises    Consulted and Agree with Plan of Care Patient           Patient will benefit from skilled therapeutic intervention in order to improve the following deficits and impairments:  Postural dysfunction,Decreased knowledge of precautions  Visit Diagnosis: Abnormal posture  Malignant neoplasm of parotid gland [facility])     Problem List Patient Active Problem List   Diagnosis Date Noted  . Malignant neoplasm of parotid gland ([place]) 07/05/2020  . Coronary artery calcification 11/16/2018  . Pulmonary nodules 01/22/2014  . Personal history of adenomatous colonic polyps 12/24/2011  . HTN (hypertension) 04/21/2011  . Hyperlipidemia 04/21/2011    Allyson Sabal [facility] 09/18/2020, 2:41 PM  [place], [place], [address] Phone: [phone]   Fax:  [phone]  Name: HAU Wood MRN: [id number] Date of Birth: 02/06/1946  Geoffrey Wood, PT 09/18/20 2:41 PM  PHYSICAL THERAPY DISCHARGE SUMMARY  Visits from Start of Care: 2  Current functional level related to goals / functional outcomes: All goals met    Remaining deficits: none   Education / Equipment: HEP  Plan: Patient agrees to discharge.  Patient goals were met. Patient is being discharged due to meeting the stated rehab goals.  ?????  Allyson Sabal Geoffrey Wood, [place] 09/18/20 2:42 PM

## 2020-09-20 ENCOUNTER — Encounter: Payer: Self-pay | Admitting: Radiation Oncology

## 2020-09-20 ENCOUNTER — Other Ambulatory Visit: Payer: Self-pay

## 2020-09-20 ENCOUNTER — Ambulatory Visit
Admission: RE | Admit: 2020-09-20 | Discharge: 2020-09-20 | Disposition: A | Discharge status: Home / Self Care | Payer: Medicare Other | Source: Ambulatory Visit | Attending: Radiation Oncology | Admitting: Radiation Oncology

## 2020-09-20 VITALS — BP 103/50 | HR 88 | Temp 98.2°F | Resp 20 | Ht 72.0 in | Wt 148.2 lb

## 2020-09-20 DIAGNOSIS — C07 Malignant neoplasm of parotid gland: Secondary | ICD-10-CM | POA: Diagnosis not present

## 2020-09-20 DIAGNOSIS — Z79899 Other long term (current) drug therapy: Secondary | ICD-10-CM | POA: Insufficient documentation

## 2020-09-20 DIAGNOSIS — R682 Dry mouth, unspecified: Secondary | ICD-10-CM | POA: Diagnosis not present

## 2020-09-20 DIAGNOSIS — R5383 Other fatigue: Secondary | ICD-10-CM | POA: Diagnosis not present

## 2020-09-20 NOTE — Progress Notes (Signed)
Geoffrey Wood presents today for 2 week follow-up after completing radiation to his right neck/parotid on 09/06/2020  Pain issues, if any: Patient denies. Reports sore throat has completely resolved Using a feeding tube?: N/A Weight changes, if any:  Wt Readings from Last 3 Encounters:  09/20/20 148 lb 3.2 oz (67.2 kg)  09/17/20 145 lb 9.6 oz (66 kg)  08/07/20 156 lb 8 oz (71 kg)   Swallowing issues, if any: Yes--unable to tolerate anything but very soft/pureed foods or liquids. Saw Geoffrey Wood on 09/09/2020: "At this time pt swallowing is deemed [facility] with water and compensation of pinching lt labial margin with liquids. SLP reviewed Geoffrey Wood's individualized HEP for dysphagia and pt completed each exercise on their own with min cuesx1, faded to modified independent. There are no overt s/s aspiration reported by pt at this time" Smoking or chewing tobacco? None Using fluoride trays daily? N/A--eager for a follow-up with Dr. Benson Norway Last ENT visit was on: Not since diagnosis. Saw. Dr. Mikki Santee on 08/22/2020 for F/U of right upper eyelid gold weight placement and right lateral canthoplasty performed almost 2 months ago  Other notable issues, if any: Patient continues to struggle with fatigue and decrease in appetite. He has very little saliva production, and reports what saliva he does create is thick and gets stuck in his throat. Under care of Dr. Sullivan Lone for low hemoglobin and platelets. Currently taking an iron supplement, but does not seem to be improving symptoms so patient may begin IVIG infusions (per wife). Reports continued hearing loss on right side. Skin in treatment field appears intact but remains reddened, and wife states right ear continues to bleed and she must change his dressing twice a day.   Vitals:   09/20/20 1444  BP: (!) 103/50  Pulse: 88  Resp: 20  Temp: 98.2 F (36.8 C)  SpO2: 93%

## 2020-09-20 NOTE — Progress Notes (Signed)
Radiation Oncology         [phone] ________________________________  Name: SHAE AUGELLO MRN: [id number]  Date: 09/20/2020  DOB: 1946/06/07  Follow-Up Visit Note  CC: Crist Infante, MD  Francina Ames, MD  Diagnosis and Prior Radiotherapy:       ICD-10-CM   1. Malignant neoplasm of parotid gland ([place])  C07     CHIEF COMPLAINT:  Here for follow-up and surveillance of parotid cancer  Narrative:  The patient returns today for routine follow-up.   Mr. Dorer presents today for 2 week follow-up after completing radiation to his right neck/parotid on 09/06/2020  Pain issues, if any: Patient denies. Reports sore throat has completely resolved Using a feeding tube?: N/A Weight changes, if any: He has maintained his weight since finishing radiation.  He is eating 3000 cal a day and starting to regain some sense of taste, though appetite is poor Wt Readings from Last 3 Encounters:  09/20/20 148 lb 3.2 oz (67.2 kg)  09/17/20 145 lb 9.6 oz (66 kg)  08/07/20 156 lb 8 oz (71 kg)   Swallowing issues, if any: Yes--unable to tolerate anything but very soft/pureed foods or liquids. Saw Glendell Docker Schinke-SLP on 09/09/2020: "At this time pt swallowing is deemed [facility] with water and compensation of pinching lt labial margin with liquids. SLP reviewed Hasani's individualized HEP for dysphagia and pt completed each exercise on their own with min cuesx1, faded to modified independent. There are no overt s/s aspiration reported by pt at this time" Smoking or chewing tobacco? None Using fluoride trays daily? N/A--eager for a follow-up with Dr. Benson Norway Last ENT visit was on: Not since diagnosis. Saw. Dr. Mikki Santee on 08/22/2020 for F/U of right upper eyelid gold weight placement and right lateral canthoplasty performed almost 2 months ago  Other notable issues, if any: Patient continues to struggle with fatigue and decrease in appetite. He has very little saliva production, and reports what saliva he does  create is thick and gets stuck in his throat. Under care of Dr. Sullivan Lone for low hemoglobin and platelets. Currently taking an iron supplement, but does not seem to be improving symptoms so patient may begin IVIG infusions (per wife). Reports continued hearing loss on right side. Skin in treatment field appears intact but remains reddened, and wife states right ear continues to bleed and she must change his dressing twice a day.  He is still very fatigued.  He is frustrated by progressive hearing loss.  Vitals:   09/20/20 1444  BP: (!) 103/50  Pulse: 88  Resp: 20  Temp: 98.2 F (36.8 C)  SpO2: 93%                       ALLERGIES:  has No Known Allergies.  Meds: Current Outpatient Medications  Medication Sig Dispense Refill  . mirtazapine (REMERON) 15 MG tablet Take 15 mg by mouth at bedtime.    Marland Kitchen acetaminophen (TYLENOL) 325 MG tablet Take 325 mg by mouth every 6 (six) hours as needed for mild pain.    [name] 62.5-25 MCG/INH AEPB Take 1 puff by mouth daily.    . Ascorbic Acid (VITAMIN C) 500 MG CAPS Take 1 capsule by mouth 2 (two) times daily.    . Cholecalciferol (VITAMIN D PO) Take 800 Units by mouth daily.     . Cyanocobalamin (VITAMIN B 12 PO) Take 500 mcg by mouth daily.     . ERGOCALCIFEROL PO Take 800 Units  by mouth daily.    Marland Kitchen erythromycin ophthalmic ointment Place 1 application into the right eye at bedtime.    Marland Kitchen ezetimibe (ZETIA) 10 MG tablet Take 10 mg by mouth daily.  (Patient not taking: Reported on 09/20/2020)    . iron polysaccharides (NIFEREX) 150 MG capsule Take 1 capsule (150 mg total) by mouth daily. 30 capsule 3  . lidocaine (XYLOCAINE) 2 % solution Patient: Mix 1part 2% viscous lidocaine, 1part H20. Swish & swallow 55m of diluted mixture, 350m before meals and at bedtime, up to QID prn soreness 200 mL 3  . LIPITOR 80 MG tablet Take 40 mg by mouth Daily.  (Patient not taking: Reported on 09/20/2020)    . Melatonin 10 MG TABS Take 10 mg by mouth at bedtime as  needed (sleep).    . niacin 500 MG tablet Take 500 mg by mouth 4 (four) times daily.     . Glory RosebushERIO test strip     . SYNJARDY 12.12-998 MG TABS Take 1 tablet by mouth daily.  (Patient not taking: Reported on 09/20/2020)     Current Facility-Administered Medications  Medication Dose Route Frequency Provider Last Rate Last Admin  . 0.9 %  sodium chloride infusion  500 mL Intravenous Once GeGatha MayerMD        Physical Findings: The patient is in no acute distress. Patient is alert and oriented. Wt Readings from Last 3 Encounters:  09/20/20 148 lb 3.2 oz (67.2 kg)  09/17/20 145 lb 9.6 oz (66 kg)  08/07/20 156 lb 8 oz (71 kg)    height is 6' (1.829 m) and weight is 148 lb 3.2 oz (67.2 kg). His temperature is 98.2 F (36.8 C). His blood pressure is 103/50 (abnormal) and his pulse is 88. His respiration is 20 and oxygen saturation is 93%. .  General: Alert and oriented, in no acute distress HEENT: Oral cavity and oropharynx are clear.  No thrush.  Stable right facial droop.  Surgical wound in right preauricular region is healing well with continued moist ooze, though improved from last week of radiation; he is no longer bleeding around his right ear Neck: Neck is notable for excellent healing of skin Skin: Skin in treatment fields shows satisfactory healing Psychiatric: Judgment and insight are intact. Affect is appropriate.   Lab Findings: Lab Results  Component Value Date   WBC 2.6 (L) 09/17/2020   HGB 9.7 (L) 09/17/2020   HCT 29.7 (L) 09/17/2020   MCV 78.4 (L) 09/17/2020   PLT 53 (L) 09/17/2020    Lab Results  Component Value Date   TSH 1.148 07/09/2020    Radiographic Findings: No results found.  Impression/Plan:    1) 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  2) Nutritional Status: Stable on oral diet.  I applauded the patient and his wife for pushing his nutrition.  They are doing an outstanding job at this.  His taste is starting to come  back.  They understand that his appetite will take a while to recuperate. PEG tube: None; I do not recommend one at this time.  3)  Swallowing: Functional, continue swallowing exercises  4) Dental: Encouraged to continue regular followup with dentistry, and dental hygiene including fluoride rinses.  We will refer him back to Dr. OwBenson Norway5) Thyroid function: Unlikely to be affected by radiation therapy but reasonable to check annually Lab Results  Component Value Date   TSH 1.148 07/09/2020    6) Other: Progressive hearing  loss: They will contact his audiologist for another assessment.  He will also ask ENT about cleaning his ear canal if indicated at follow-up.  Dry mouth: Continue to push sips of water, Biotene products; I also recommended he try therabreath lozenges  7) Follow-up in 2 and half months with CT of neck with contrast.  He anticipates a CT of his chest in the next couple weeks with his pulmonologist.   We will refer him back to otolaryngology for a post treatment follow-up.  He and his wife know to contact us if they have any concerns in the interim.  He will also continue to follow closely with medical oncology for pancytopenia of unknown etiology.  He may need a bone marrow biopsy in the future.  It is possible that some of his fatigue and malaise is related to his pancytopenia.  On date of service, in total, I spent 30 minutes on this encounter. Patient was seen in person. _____________________________________   Eppie Gibson, MD

## 2020-09-23 NOTE — Progress Notes (Signed)
Oncology Nurse Navigator Documentation  I met with Geoffrey Wood and his wife during his appointment with [name] on 2/4 to follow up after radiation completion. He is maintaining his weight by drinking nutritional supplements as recommended by the [facility]. He has been scheduled for another follow up appointment with [name] on 12/06/20 to receive results of a CT scan completed the day before. At the request of [name] I have sent a message to Dentistry for a follow up dentist appointment and [name] office for a follow up ENT visit within one month. They know to call me if they have any further questions or concerns.   [name] RN, BSN, OCN Head & Neck Oncology Nurse [name] at [facility] Phone # [phone]  Fax # [phone]

## 2020-09-24 DIAGNOSIS — D693 Immune thrombocytopenic purpura: Secondary | ICD-10-CM | POA: Insufficient documentation

## 2020-09-25 ENCOUNTER — Telehealth: Payer: Self-pay | Admitting: Hematology

## 2020-09-25 NOTE — Telephone Encounter (Signed)
Scheduled appt per 2/8 sch msg - pt is aware of appt date and time

## 2020-09-26 ENCOUNTER — Ambulatory Visit
Admission: RE | Admit: 2020-09-26 | Discharge: 2020-09-26 | Disposition: A | Discharge status: Home / Self Care | Payer: Medicare Other | Source: Ambulatory Visit | Attending: Pulmonary Disease | Admitting: Pulmonary Disease

## 2020-09-26 ENCOUNTER — Other Ambulatory Visit: Payer: Self-pay

## 2020-09-26 DIAGNOSIS — J849 Interstitial pulmonary disease, unspecified: Secondary | ICD-10-CM

## 2020-09-27 ENCOUNTER — Other Ambulatory Visit (HOSPITAL_COMMUNITY)
Admission: RE | Admit: 2020-09-27 | Discharge: 2020-09-27 | Disposition: A | Discharge status: Home / Self Care | Payer: Medicare Other | Source: Ambulatory Visit | Attending: Pulmonary Disease | Admitting: Pulmonary Disease

## 2020-09-27 DIAGNOSIS — Z01812 Encounter for preprocedural laboratory examination: Secondary | ICD-10-CM | POA: Diagnosis present

## 2020-09-27 DIAGNOSIS — Z20822 Contact with and (suspected) exposure to covid-19: Secondary | ICD-10-CM | POA: Diagnosis not present

## 2020-09-27 LAB — SARS CORONAVIRUS 2 (TAT 6-24 HRS): SARS Coronavirus 2: NEGATIVE

## 2020-09-30 ENCOUNTER — Encounter: Payer: Self-pay | Admitting: Pulmonary Disease

## 2020-09-30 ENCOUNTER — Other Ambulatory Visit: Payer: Self-pay

## 2020-09-30 ENCOUNTER — Ambulatory Visit (INDEPENDENT_AMBULATORY_CARE_PROVIDER_SITE_OTHER): Payer: Medicare Other | Admitting: Pulmonary Disease

## 2020-09-30 VITALS — BP 108/64 | HR 70 | Temp 97.4°F | Ht 72.0 in | Wt 144.6 lb

## 2020-09-30 DIAGNOSIS — J84112 Idiopathic pulmonary fibrosis: Secondary | ICD-10-CM | POA: Diagnosis not present

## 2020-09-30 DIAGNOSIS — J849 Interstitial pulmonary disease, unspecified: Secondary | ICD-10-CM | POA: Diagnosis not present

## 2020-09-30 LAB — PULMONARY FUNCTION TEST
DL/VA % pred: 43 %
DL/VA: 1.7 ml/min/mmHg/L
DLCO cor % pred: 31 %
DLCO cor: 8.21 ml/min/mmHg
DLCO unc % pred: 26 %
DLCO unc: 6.8 ml/min/mmHg
FEF 25-75 Post: 4.66 L/sec
FEF 25-75 Pre: 3.71 L/sec
FEF2575-%Change-Post: 25 %
FEF2575-%Pred-Post: 205 %
FEF2575-%Pred-Pre: 163 %
FEV1-%Change-Post: 5 %
FEV1-%Pred-Post: 81 %
FEV1-%Pred-Pre: 76 %
FEV1-Post: 2.56 L
FEV1-Pre: 2.43 L
FEV1FVC-%Change-Post: 11 %
FEV1FVC-%Pred-Pre: 120 %
FEV6-%Change-Post: -5 %
FEV6-%Pred-Post: 63 %
FEV6-%Pred-Pre: 67 %
FEV6-Post: 2.62 L
FEV6-Pre: 2.77 L
FEV6FVC-%Change-Post: 0 %
FEV6FVC-%Pred-Post: 106 %
FEV6FVC-%Pred-Pre: 106 %
FVC-%Change-Post: -5 %
FVC-%Pred-Post: 60 %
FVC-%Pred-Pre: 63 %
FVC-Post: 2.62 L
FVC-Pre: 2.77 L
Post FEV1/FVC ratio: 98 %
Post FEV6/FVC ratio: 100 %
Pre FEV1/FVC ratio: 88 %
Pre FEV6/FVC Ratio: 100 %
RV % pred: 129 %
RV: 3.41 L
TLC % pred: 83 %
TLC: 6.02 L

## 2020-09-30 NOTE — Patient Instructions (Signed)
I have reviewed your CT scan and lung function test and labs They show findings of emphysema and scarring in the lung.  A condition called pulmonary fibrosis This is progressive over time  We have discussed possible treatment options but have decided to hold off given significant side effects and since he is still recovering from other health issues  We will follow back in clinic in 6 months to reevaluate.

## 2020-09-30 NOTE — Progress Notes (Signed)
PFT done today. 

## 2020-09-30 NOTE — Progress Notes (Signed)
Geoffrey Wood    [id number]    14-Jan-1946  Primary Care Physician:Perini, Geoffrey [place]adel[place], MD  Referring Physician: Crist Wood, Geoffrey Wood,  [place] [id number]  Chief complaint: Follow-up for emphysema, pulmonary fibrosis  HPI: 75 year old heavy ex-smoker with history of emphysema, hyperlipidemia, hypertension, pulmonary nodules. Referred for evaluation of interstitial lung disease, pulmonary fibrosis noted on screening CT of the chest. He has chronic dyspnea on exertion which has been gradually getting worse over the past few years, chronic cough with white mucus.  No wheezing.  He has a recent diagnosis of parotid cancer and underwent 2 surgeries under general anesthesia which he tolerated well and is going to start radiation therapy which will extend to end of January 2022. Also recently evaluated by hematology for low platelets, possible ITP.  Further work-up is in progress for this.  Pets: No pets Occupation: Retired Hotel manager for [employer] Exposures: No known exposures.  No mold, hot tub, Jacuzzi ILD questionnaire 07/15/2020-negative Smoking history: 90-pack-year smoker.  Quit in 2016 Travel history: Originally from [place].  No significant recent travel Relevant family history: Father had lung cancer, mother had COPD.  Both were heavy smokers.  Interim history: Here for follow-up of CT scan and PFTs Continues to have mild dyspnea on exertion  Finished radiation for parotid cancer. Chief complaint today is significant weight loss.  He is trying to increase his oral nutritional supplements without effect He is talking with his oncologist about possible feeding tube to increase nutritional intake.  He had a normal colonoscopy in 2019  Outpatient Encounter Medications as of 09/30/2020  Medication Sig  . acetaminophen (TYLENOL) 325 MG tablet Take 325 mg by mouth every 6 (six) hours as needed for mild pain.  Geoffrey Wood ELLIPTA 62.5-25 MCG/INH AEPB Take 1 puff  by mouth daily.  . Ascorbic Acid (VITAMIN C) 500 MG CAPS Take 1 capsule by mouth 2 (two) times daily.  . Cholecalciferol (VITAMIN D PO) Take 800 Units by mouth daily.  . Cyanocobalamin (VITAMIN B 12 PO) Take 500 mcg by mouth daily.  . ERGOCALCIFEROL PO Take 800 Units by mouth daily.  . iron polysaccharides (NIFEREX) 150 MG capsule Take 1 capsule (150 mg total) by mouth daily.  . Melatonin 10 MG TABS Take 10 mg by mouth at bedtime as needed (sleep).  . mirtazapine (REMERON) 15 MG tablet Take 15 mg by mouth at bedtime.  Geoffrey Wood VERIO test strip   . [DISCONTINUED] erythromycin ophthalmic ointment Place 1 application into the right eye at bedtime.  . [DISCONTINUED] ezetimibe (ZETIA) 10 MG tablet Take 10 mg by mouth daily.  (Patient not taking: No sig reported)  . [DISCONTINUED] lidocaine (XYLOCAINE) 2 % solution Patient: Mix 1part 2% viscous lidocaine, 1part H20. Swish & swallow 27mL of diluted mixture, 73min before meals and at bedtime, up to QID prn soreness  . [DISCONTINUED] LIPITOR 80 MG tablet Take 40 mg by mouth Daily.  (Patient not taking: Reported on 09/20/2020)  . [DISCONTINUED] niacin 500 MG tablet Take 500 mg by mouth 4 (four) times daily.   . [DISCONTINUED] SYNJARDY 12.12-998 MG TABS Take 1 tablet by mouth daily.  (Patient not taking: No sig reported)   Facility-Administered Encounter Medications as of 09/30/2020  Medication  . 0.9 %  sodium chloride infusion    Allergies as of 09/30/2020  . (No Known Allergies)    Physical Exam: Blood pressure 108/64, pulse 70, temperature (!) 97.4 F (36.3 C), temperature source  Temporal, height 6' (1.829 m), weight 144 lb 9.6 oz (65.6 kg), SpO2 94 %. Gen:      No acute distress, cachectic HEENT:  EOMI, sclera anicteric Neck:     No masses; no thyromegaly Lungs:    Bibasal crackles, diminished air entry CV:         Regular rate and rhythm; no murmurs Abd:      + bowel sounds; soft, non-tender; no palpable masses, no distension Ext:    No  edema; adequate peripheral perfusion Skin:      Warm and dry; no rash Neuro: alert and oriented x 3 Psych: normal mood and affect  Data Reviewed: Imaging: Low-dose screening CT 04/16/2020-moderate emphysema, stable pulmonary nodules, scattered areas of cylindrical bronchiectasis with reticulation and possible honeycombing in the mid to lower lungs. 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 I have reviewed the images personally.  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  He does not have significant exposure history and CTD serologies are significant only for borderline ANA which is nonspecific.  There are no connective tissue disease symptoms.  Discussed diagnosis with patient and wife in detail today Reviewed treatment options including antifibrotic's.  I am not sure if he will be able to tolerate it at this moment given significant weight loss and other health issues as he is recovering from treatment for parotid cancer. He continues follow-up with Geoffrey. Irene Wood for cytopenias  After discussion we have decided to hold off initiating treatment at present I will follow back with him in 6 months to reassess.   Emphysema He has severe emphysema.  Spirometry and lung volumes are normal since he has a combination of emphysema and pulmonary fibrosis.  Diffusion capacity severely reduced. Continue anoro  Plan/Recommendations: Follow-up in 6 months.  Geoffrey Garfinkel MD [facility] 09/30/2020, 10:35 AM  CC: Geoffrey Infante, MD

## 2020-10-01 ENCOUNTER — Inpatient Hospital Stay: Payer: Medicare Other

## 2020-10-01 NOTE — Progress Notes (Addendum)
Nutrition Follow-up:  Patient with right parotid mass, s/p surgery.  Patient has completed radiation.  Spoke with wife for nutrition follow-up.  Patient reports on average for the last 2 weeks patient has been eating ~3300 calories per day.  Drinking ensure complete at least 2 times per day, making smoothies (900 calories about 3 per day).  Drinking whole milk. Using benecal (330 calories, 9 g protein) and adding to ensure shakes and smoothies for additional calories.  Wife reports that last week patient was able to play 18 holes of golf.  Plays poker with friends.  Still no taste and having issues with dry mouth.  Wife reports PCP doubled dose of remeron.      Medications: reviewed  Labs: reviewed  Anthropometrics:   Weight 144 lb 9.6 oz (2/14)  148 lb 3.2 oz on 2/4 (Dr Isidore Moos) 12/22 156 lb 155 lb 12/20 153 lb on 12/13 156 lb on 11/29    Estimated Energy Needs  Kcals: 2100-2485 Protein: 105-124 g Fluid: 2.1 L  NUTRITION DIAGNOSIS: Inadequate oral intake stable   INTERVENTION:  Remarkable that patient is taking in ~3300 calories the last 2 weeks (132% above estimated needs) and discussed this with wife.  Continued to encourage patient to push calories and protein orally. Wife to continue to keep food diary. Encouraged wife to have patient continue to be active.   Wife has contact information    MONITORING, EVALUATION, GOAL: weight trends, intake   NEXT VISIT: March 15 phone call  Michiel Sivley B. Zenia Resides, [place], [place] Registered Dietitian [phone] (mobile)

## 2020-10-02 ENCOUNTER — Other Ambulatory Visit: Payer: Self-pay

## 2020-10-02 ENCOUNTER — Inpatient Hospital Stay: Payer: Medicare Other

## 2020-10-02 VITALS — BP 121/76 | HR 72 | Temp 97.8°F | Resp 18 | Wt 144.8 lb

## 2020-10-02 DIAGNOSIS — D61818 Other pancytopenia: Secondary | ICD-10-CM | POA: Diagnosis not present

## 2020-10-02 DIAGNOSIS — D693 Immune thrombocytopenic purpura: Secondary | ICD-10-CM

## 2020-10-02 MED ORDER — METHYLPREDNISOLONE SODIUM SUCC 125 MG IJ SOLR
60.0000 mg | Freq: Once | INTRAMUSCULAR | Status: AC
  Administered 2020-10-02: 60 mg via INTRAVENOUS

## 2020-10-02 MED ORDER — METHYLPREDNISOLONE SODIUM SUCC 125 MG IJ SOLR
INTRAMUSCULAR | Status: AC
  Filled 2020-10-02: qty 2

## 2020-10-02 MED ORDER — DIPHENHYDRAMINE HCL 25 MG PO CAPS
ORAL_CAPSULE | ORAL | Status: AC
  Filled 2020-10-02: qty 1

## 2020-10-02 MED ORDER — DIPHENHYDRAMINE HCL 25 MG PO CAPS
25.0000 mg | ORAL_CAPSULE | Freq: Once | ORAL | Status: AC
  Administered 2020-10-02: 25 mg via ORAL

## 2020-10-02 MED ORDER — IMMUNE GLOBULIN (HUMAN) 10 GM/100ML IV SOLN
1.0000 g/kg | INTRAVENOUS | Status: DC
  Administered 2020-10-02: 65 g via INTRAVENOUS
  Filled 2020-10-02: qty 200

## 2020-10-02 MED ORDER — DEXTROSE 5 % IV SOLN
Freq: Once | INTRAVENOUS | Status: AC
  Filled 2020-10-02: qty 250

## 2020-10-02 MED ORDER — SODIUM CHLORIDE 0.9 % IV SOLN
INTRAVENOUS | Status: DC
  Filled 2020-10-02 (×2): qty 250

## 2020-10-02 MED ORDER — ACETAMINOPHEN 325 MG PO TABS
650.0000 mg | ORAL_TABLET | Freq: Once | ORAL | Status: AC
  Administered 2020-10-02: 650 mg via ORAL

## 2020-10-02 MED ORDER — ACETAMINOPHEN 325 MG PO TABS
ORAL_TABLET | ORAL | Status: AC
  Filled 2020-10-02: qty 2

## 2020-10-02 NOTE — Patient Instructions (Signed)
Immune Globulin Injection What is this medicine? IMMUNE GLOBULIN (im MUNE GLOB yoo lin) helps to prevent or reduce the severity of certain infections in patients who are at risk. This medicine is collected from the pooled blood of many donors. It is used to treat immune system problems, thrombocytopenia, and Kawasaki syndrome. This medicine may be used for other purposes; ask your health care provider or pharmacist if you have questions. COMMON BRAND NAME(S): ASCENIV, Baygam, BIVIGAM, Carimune, Carimune NF, cutaquig, Cuvitru, Flebogamma, Flebogamma DIF, GamaSTAN, GamaSTAN S/D, Gamimune N, Gammagard, Gammagard S/D, Gammaked, Gammaplex, Gammar-P IV, Gamunex, Gamunex-C, Hizentra, Iveegam, Iveegam EN, Octagam, Panglobulin, Panglobulin NF, panzyga, Polygam S/D, Privigen, Sandoglobulin, Venoglobulin-S, Vigam, Vivaglobulin, Xembify What should I tell my health care provider before I take this medicine? They need to know if you have any of these conditions:  diabetes  extremely low or no immune antibodies in the blood  heart disease  history of blood clots  hyperprolinemia  infection in the blood, sepsis  kidney disease  recently received or scheduled to receive a vaccination  an unusual or allergic reaction to human immune globulin, albumin, maltose, sucrose, other medicines, foods, dyes, or preservatives  pregnant or trying to get pregnant  breast-feeding How should I use this medicine? This medicine is for injection into a muscle or infusion into a vein or skin. It is usually given by a health care professional in a hospital or clinic setting. In rare cases, some brands of this medicine might be given at home. You will be taught how to give this medicine. Use exactly as directed. Take your medicine at regular intervals. Do not take your medicine more often than directed. Talk to your pediatrician regarding the use of this medicine in children. While this drug may be prescribed for selected  conditions, precautions do apply. Overdosage: If you think you have taken too much of this medicine contact a poison control center or emergency room at once. NOTE: This medicine is only for you. Do not share this medicine with others. What if I miss a dose? It is important not to miss your dose. Call your doctor or health care professional if you are unable to keep an appointment. If you give yourself the medicine and you miss a dose, take it as soon as you can. If it is almost time for your next dose, take only that dose. Do not take double or extra doses. What may interact with this medicine?  aspirin and aspirin-like medicines  cisplatin  cyclosporine  medicines for infection like acyclovir, adefovir, amphotericin B, bacitracin, cidofovir, foscarnet, ganciclovir, gentamicin, pentamidine, vancomycin  NSAIDS, medicines for pain and inflammation, like ibuprofen or naproxen  pamidronate  vaccines  zoledronic acid This list may not describe all possible interactions. Give your health care provider a list of all the medicines, herbs, non-prescription drugs, or dietary supplements you use. Also tell them if you smoke, drink alcohol, or use illegal drugs. Some items may interact with your medicine. What should I watch for while using this medicine? Your condition will be monitored carefully while you are receiving this medicine. This medicine is made from pooled blood donations of many different people. It may be possible to pass an infection in this medicine. However, the donors are screened for infections and all products are tested for HIV and hepatitis. The medicine is treated to kill most or all bacteria and viruses. Talk to your doctor about the risks and benefits of this medicine. Do not have vaccinations for at least   14 days before, or until at least 3 months after receiving this medicine. What side effects may I notice from receiving this medicine? Side effects that you should report  to your doctor or health care professional as soon as possible:  allergic reactions like skin rash, itching or hives, swelling of the face, lips, or tongue  blue colored lips or skin  breathing problems  chest pain or tightness  fever  signs and symptoms of aseptic meningitis such as stiff neck; sensitivity to light; headache; drowsiness; fever; nausea; vomiting; rash  signs and symptoms of a blood clot such as chest pain; shortness of breath; pain, swelling, or warmth in the leg  signs and symptoms of hemolytic anemia such as fast heartbeat; tiredness; dark yellow or brown urine; or yellowing of the eyes or skin  signs and symptoms of kidney injury like trouble passing urine or change in the amount of urine  sudden weight gain  swelling of the ankles, feet, hands Side effects that usually do not require medical attention (report to your doctor or health care professional if they continue or are bothersome):  diarrhea  flushing  headache  increased sweating  joint pain  muscle cramps  muscle pain  nausea  pain, redness, or irritation at site where injected  tiredness This list may not describe all possible side effects. Call your doctor for medical advice about side effects. You may report side effects to FDA at 1-800-FDA-1088. Where should I keep my medicine? Keep out of the reach of children. This drug is usually given in a hospital or clinic and will not be stored at home. In rare cases, some brands of this medicine may be given at home. If you are using this medicine at home, you will be instructed on how to store this medicine. Throw away any unused medicine after the expiration date on the label. NOTE: This sheet is a summary. It may not cover all possible information. If you have questions about this medicine, talk to your doctor, pharmacist, or health care provider.  2021 Elsevier/Gold Standard (2019-03-08 12:51:14)  

## 2020-10-02 NOTE — Progress Notes (Signed)
Prior to starting IVIG vital signs taken. BP 83/47 and then rechecked- 92/47. Dr. Irene Limbo made aware. Per Dr. Irene Limbo ok to proceed with IVIG and ok to give 250 cc bolus of normal saline prior to infusion

## 2020-10-08 ENCOUNTER — Ambulatory Visit (HOSPITAL_COMMUNITY): Payer: Self-pay | Admitting: Dentistry

## 2020-10-08 ENCOUNTER — Other Ambulatory Visit: Payer: Self-pay

## 2020-10-08 VITALS — BP 106/50 | HR 81 | Temp 97.9°F | Wt 138.0 lb

## 2020-10-08 DIAGNOSIS — Z923 Personal history of irradiation: Secondary | ICD-10-CM | POA: Diagnosis not present

## 2020-10-08 DIAGNOSIS — C07 Malignant neoplasm of parotid gland: Secondary | ICD-10-CM | POA: Diagnosis not present

## 2020-10-08 DIAGNOSIS — R634 Abnormal weight loss: Secondary | ICD-10-CM

## 2020-10-08 DIAGNOSIS — K117 Disturbances of salivary secretion: Secondary | ICD-10-CM

## 2020-10-08 DIAGNOSIS — Z5189 Encounter for other specified aftercare: Secondary | ICD-10-CM

## 2020-10-08 DIAGNOSIS — R252 Cramp and spasm: Secondary | ICD-10-CM

## 2020-10-08 DIAGNOSIS — K036 Deposits [accretions] on teeth: Secondary | ICD-10-CM

## 2020-10-08 DIAGNOSIS — R432 Parageusia: Secondary | ICD-10-CM

## 2020-10-08 NOTE — Progress Notes (Signed)
DENTAL VISIT LIMITED EXAM   Date of Appointment:  10/08/2020 Patient Name:   Geoffrey Wood Date of Birth:   [birth year] Medical Record Number: [id number]   COVID 19 SCREENING: The patient does not symptoms concerning for COVID-19 infection (Including fever, chills, cough, or new SHORTNESS OF BREATH).    . 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 Patient has completed 40 of 33 radiation treatments from 07/22/20 to 09/06/20. . Reviewed medical and dental history with the patient.  VITALS: BP (!) 106/50 (BP Location: Left Arm)   Pulse 81   Temp 97.9 F (36.6 C)   Wt 138 lb (62.6 kg)   BMI 18.72 kg/m    REVIEW OF CHIEF COMPLAINTS: DRY MOUTH: Yes HARD TO SWALLOW: No  HURT TO SWALLOW: No TASTE CHANGES: Loss of taste SORES IN MOUTH: No TRISMUS: Patient denies symptoms WEIGHT: 138 lbs down from 162 lbs  HOME ORAL HYGIENE REGIMEN: BRUSHING: Twice daily FLOSSING: Daily RINSING: Uses Xylitol and ACT for gums (not fluoride) FLUORIDE: In toothpaste TRISMUS EXERCISES: Maximum interincisal opening: 28 mm down from 33 mm   Patient Active Problem List   Diagnosis Date Noted  . IPF (idiopathic pulmonary fibrosis) ([place]) 09/30/2020  . 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 Malignant neoplasm of parotid gland ([place]) 07/05/2020  . Coronary artery calcification 11/16/2018  . Pulmonary nodules 01/22/2014  . Personal history of adenomatous colonic polyps 12/24/2011  . HTN (hypertension) 04/21/2011  . Hyperlipidemia 04/21/2011   Past Medical History:  Diagnosis Date  . Adenomatous colon polyp   . Cataract   . Diabetes mellitus   . Emphysema   . Emphysema of lung ([place])   . Erectile dysfunction   . Hyperlipidemia   . Hypertension   . Peripheral vascular disease ([place])   . Pneumonia   . Pulmonary nodule    Past Surgical History:  Procedure Laterality Date  . ABDOMINAL AORTIC ANEURYSM REPAIR  2004  . COLONOSCOPY   multiple since 2004   Current Outpatient Medications  Medication Sig Dispense Refill  . acetaminophen (TYLENOL) 325 MG tablet Take 325 mg by mouth every 6 (six) hours as needed for mild pain.    [name] 62.5-25 MCG/INH AEPB Take 1 puff by mouth daily.    . Ascorbic Acid (VITAMIN C) 500 MG CAPS Take 1 capsule by mouth 2 (two) times daily.    . Cholecalciferol (VITAMIN D PO) Take 800 Units by mouth daily.    . Cyanocobalamin (VITAMIN B 12 PO) Take 500 mcg by mouth daily.    . ERGOCALCIFEROL PO Take 800 Units by mouth daily.    . iron polysaccharides (NIFEREX) 150 MG capsule Take 1 capsule (150 mg total) by mouth daily. 30 capsule 3  . Melatonin 10 MG TABS Take 10 mg by mouth at bedtime as needed (sleep).    . mirtazapine (REMERON) 15 MG tablet Take 15 mg by mouth at bedtime.    Glory Rosebush VERIO test strip      Current Facility-Administered Medications  Medication Dose Route Frequency Provider Last Rate Last Admin  . 0.9 %  sodium chloride infusion  500 mL Intravenous Once Gatha Mayer, MD       No Known Allergies   DENTAL EXAM: Oral Hygiene:(PLAQUE): Localized slight accumulation LOCATION OF MUCOSITIS: None DESCRIPTION OF SALIVA: Viscous saliva, mild xerostomia ANY EXPOSED BONE: None OTHER WATCHED AREAS: None   DIAGNOSES:  1. Xerostomia: Worst reported side effect at this time.  He reports his symptoms are the worst at night.  He is taking sips of water when needed.  He has Biotine, but it has not provided much relief.  Recommend trying CLoSYs brand gel or rinse for dry mouth and see if it lessens his symptoms.  Coupons given to patient today. 2. 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  He has recently completed radiation about 1 month ago, and this can take up to 3 months to return. 3. Weight Loss: 138 lbs down from initial 162 lbs.  He is making smoothies daily and is consuming about 3,000 + calories.   4. Accretions:  Very slight plaque evident on few interproximal surfaces.  He is doing a good job with oral hygiene, and is planning to see his dentist and periodontist in the upcoming months. 5. Trismus: He denies any symptoms or issues with limited opening.  His MIO fortunately only decreased from 28 mm to 33 mm.   RECOMMENDATIONS: 1. Brush after meals and at bedtime.  Use fluoride at bedtime. 2. Use trismus exercises as directed. 3. Use CLoSYs for dry mouth and let us know how this works for you. 4. Take multiple sips of water as needed. 5. Return to your regular dentist for routine dental care including cleanings and periodic exams.  A referral back to your dentist will be sent from our clinic regarding radiation therapy to the head and neck and common side effects. 6. Call if any problems or concerns arise.   . All questions and concerns were addressed, and patient verbalized understanding of discussion, findings and recommendations.   . Patient tolerated today's visit well and departed in stable condition.   [place] Benson [place], DMD

## 2020-10-08 NOTE — Patient Instructions (Signed)
[facility] Department of Dental Medicine Dr. Julious Langlois B. Tylen Leverich, DMD Phone: [phone] Fax: [phone]   It was a pleasure seeing you today!  Please refer to the information below regarding your dental visit with us, and call us should you have any questions or concerns that may come up after you leave.   Thank you for giving us the opportunity to provide care for you.  If there is anything we can do for you, please let us know.    RECOMMENDATIONS: 1. Brush after meals and at bedtime.  Floss once a day, and use fluoride at bedtime. 2. Use trismus exercises as directed below. 3. Use CLOSYs for dry mouth, and salt water/baking soda rinses to help with any mouth sores. 4. Take multiple sips of water as needed.  Stay hydrated. 5. Return to your regular dentist for routine dental care including cleanings and periodic exams.  If you do not have a regular dentist, it is important to establish care at an outside dental office of your choice.  6. Call us if any problems or concerns arise.  TRISMUS Trismus is a condition where the jaw does not allow the mouth to open as wide as it usually does.  This can happen almost suddenly, or in other cases the process is so slow, it is hard to notice it-until it is too far along.  When the jaw joints and/or muscles have been exposed to radiation treatments, the onset of Trismus is very slow.  This is because the muscles are losing their stretching ability over a long period of time, as long as 2 YEARS after the end of radiation.  It is therefore important to exercise these muscles and joints.  TRISMUS EXERCISES:  Stack of tongue depressors measuring the same or a little less than the last documented MIO (Maximum Interincisal Opening).  Secure them with a rubber band on both ends.  Place the stack in the patient's mouth, supporting the other end.  Allow 30 seconds for muscle stretching.  Rest for a few seconds.  Repeat 3-5 times.  For all  radiation patients, this exercise is recommended in the mornings and evenings unless otherwise instructed.  The exercise should be done for a period of 2 YEARS after the end of radiation.  Maximum jaw opening should be checked routinely on recall dental visits by your general dentist.  The patient is advised to report any changes, soreness, or difficulties encountered when doing the exercises.  

## 2020-10-09 ENCOUNTER — Inpatient Hospital Stay: Payer: Medicare Other

## 2020-10-09 ENCOUNTER — Other Ambulatory Visit: Payer: Self-pay

## 2020-10-09 VITALS — BP 90/49 | HR 70 | Temp 98.3°F | Resp 18

## 2020-10-09 DIAGNOSIS — D61818 Other pancytopenia: Secondary | ICD-10-CM | POA: Diagnosis not present

## 2020-10-09 DIAGNOSIS — D693 Immune thrombocytopenic purpura: Secondary | ICD-10-CM

## 2020-10-09 LAB — CMP (CANCER CENTER ONLY)
ALT: 9 U/L (ref 0–44)
AST: 14 U/L — ABNORMAL LOW (ref 15–41)
Albumin: 2.7 g/dL — ABNORMAL LOW (ref 3.5–5.0)
Alkaline Phosphatase: 80 U/L (ref 38–126)
Anion gap: 8 (ref 5–15)
BUN: 15 mg/dL (ref 8–23)
CO2: 24 mmol/L (ref 22–32)
Calcium: 8.4 mg/dL — ABNORMAL LOW (ref 8.9–10.3)
Chloride: 100 mmol/L (ref 98–111)
Creatinine: 0.88 mg/dL (ref 0.61–1.24)
GFR, Estimated: >60 mL/min (ref >60)
Glucose, Bld: 252 mg/dL — ABNORMAL HIGH (ref 70–99)
Potassium: 3.9 mmol/L (ref 3.5–5.1)
Sodium: 132 mmol/L — ABNORMAL LOW (ref 135–145)
Total Bilirubin: 0.9 mg/dL (ref 0.3–1.2)
Total Protein: 7.2 g/dL (ref 6.5–8.1)

## 2020-10-09 LAB — CBC WITH DIFFERENTIAL (CANCER CENTER ONLY)
Abs Immature Granulocytes: 0.23 10*3/uL — ABNORMAL HIGH (ref 0.00–0.07)
Basophils Absolute: 0 10*3/uL (ref 0.0–0.1)
Basophils Relative: 1 %
Eosinophils Absolute: 0 10*3/uL (ref 0.0–0.5)
Eosinophils Relative: 1 %
HCT: 28.7 % — ABNORMAL LOW (ref 39.0–52.0)
Hemoglobin: 9 g/dL — ABNORMAL LOW (ref 13.0–17.0)
Immature Granulocytes: 8 %
Lymphocytes Relative: 24 %
Lymphs Abs: 0.7 10*3/uL (ref 0.7–4.0)
MCH: 24.4 pg — ABNORMAL LOW (ref 26.0–34.0)
MCHC: 31.4 g/dL (ref 30.0–36.0)
MCV: 77.8 fL — ABNORMAL LOW (ref 80.0–100.0)
Monocytes Absolute: 0.5 10*3/uL (ref 0.1–1.0)
Monocytes Relative: 16 %
Neutro Abs: 1.6 10*3/uL — ABNORMAL LOW (ref 1.7–7.7)
Neutrophils Relative %: 50 %
Platelet Count: 73 10*3/uL — ABNORMAL LOW (ref 150–400)
RBC: 3.69 MIL/uL — ABNORMAL LOW (ref 4.22–5.81)
RDW: 27.1 % — ABNORMAL HIGH (ref 11.5–15.5)
WBC Count: 3.1 10*3/uL — ABNORMAL LOW (ref 4.0–10.5)
nRBC: 13 % — ABNORMAL HIGH (ref 0.0–0.2)

## 2020-10-09 MED ORDER — METHYLPREDNISOLONE SODIUM SUCC 125 MG IJ SOLR
60.0000 mg | Freq: Once | INTRAMUSCULAR | Status: AC
  Administered 2020-10-09: 60 mg via INTRAVENOUS

## 2020-10-09 MED ORDER — DEXTROSE 5 % IV SOLN
INTRAVENOUS | Status: DC
  Filled 2020-10-09: qty 250

## 2020-10-09 MED ORDER — DIPHENHYDRAMINE HCL 25 MG PO CAPS
ORAL_CAPSULE | ORAL | Status: AC
  Filled 2020-10-09: qty 1

## 2020-10-09 MED ORDER — DIPHENHYDRAMINE HCL 25 MG PO CAPS
25.0000 mg | ORAL_CAPSULE | Freq: Once | ORAL | Status: AC
Start: 2020-10-09 — End: 2020-10-09
  Administered 2020-10-09: 25 mg via ORAL

## 2020-10-09 MED ORDER — METHYLPREDNISOLONE SODIUM SUCC 125 MG IJ SOLR
INTRAMUSCULAR | Status: AC
  Filled 2020-10-09: qty 2

## 2020-10-09 MED ORDER — IMMUNE GLOBULIN (HUMAN) 10 GM/100ML IV SOLN
1.0000 g/kg | Freq: Once | INTRAVENOUS | Status: AC
  Administered 2020-10-09: 65 g via INTRAVENOUS
  Filled 2020-10-09: qty 600

## 2020-10-09 MED ORDER — ACETAMINOPHEN 325 MG PO TABS
650.0000 mg | ORAL_TABLET | Freq: Once | ORAL | Status: AC
  Administered 2020-10-09: 650 mg via ORAL

## 2020-10-09 MED ORDER — ACETAMINOPHEN 325 MG PO TABS
ORAL_TABLET | ORAL | Status: AC
  Filled 2020-10-09: qty 2

## 2020-10-09 NOTE — Patient Instructions (Signed)
[facility] Discharge Instructions for Patients Receiving Chemotherapy  Today you received the following chemotherapy agents cisplatin  To help prevent nausea and vomiting after your treatment, we encourage you to take your nausea medication as directed   If you develop nausea and vomiting that is not controlled by your nausea medication, call the clinic.   BELOW ARE SYMPTOMS THAT SHOULD BE REPORTED IMMEDIATELY:  *FEVER GREATER THAN 100.5 F  *CHILLS WITH OR WITHOUT FEVER  NAUSEA AND VOMITING THAT IS NOT CONTROLLED WITH YOUR NAUSEA MEDICATION  *UNUSUAL SHORTNESS OF BREATH  *UNUSUAL BRUISING OR BLEEDING  TENDERNESS IN MOUTH AND THROAT WITH OR WITHOUT PRESENCE OF ULCERS  *URINARY PROBLEMS  *BOWEL PROBLEMS  UNUSUAL RASH Items with * indicate a potential emergency and should be followed up as soon as possible.  Feel free to call the clinic should you have any questions or concerns. The clinic phone number is [phone].  Please show the CHEMO ALERT CARD at check-in to the Emergency Department and triage nurse.   

## 2020-10-09 NOTE — Progress Notes (Deleted)
Per Dr. Chryl Heck, ok to treat with platelets 97.

## 2020-10-14 ENCOUNTER — Other Ambulatory Visit: Payer: Self-pay

## 2020-10-14 ENCOUNTER — Other Ambulatory Visit: Payer: Self-pay | Admitting: *Deleted

## 2020-10-14 ENCOUNTER — Ambulatory Visit: Payer: Medicare Other

## 2020-10-14 DIAGNOSIS — D693 Immune thrombocytopenic purpura: Secondary | ICD-10-CM

## 2020-10-14 DIAGNOSIS — D649 Anemia, unspecified: Secondary | ICD-10-CM

## 2020-10-14 DIAGNOSIS — R1310 Dysphagia, unspecified: Secondary | ICD-10-CM

## 2020-10-14 DIAGNOSIS — E538 Deficiency of other specified B group vitamins: Secondary | ICD-10-CM

## 2020-10-14 DIAGNOSIS — R293 Abnormal posture: Secondary | ICD-10-CM | POA: Diagnosis not present

## 2020-10-14 NOTE — Patient Instructions (Signed)
Signs of Aspiration Pneumonia   . Chest pain/tightness . Fever (can be low grade) . Cough  o With foul-smelling phlegm (sputum) o With sputum containing pus or blood o With greenish sputum . Fatigue  . Shortness of breath  . Wheezing   **IF YOU HAVE THESE SIGNS, CONTACT YOUR DOCTOR OR GO TO THE EMERGENCY DEPARTMENT OR URGENT CARE AS SOON AS POSSIBLE**      

## 2020-10-14 NOTE — Therapy (Signed)
[facility] [address] [place], [place], [address] Phone: [phone]   Fax:  [phone]  Speech Language Pathology Treatment  Patient Details  Name: Geoffrey Wood MRN: [id number] Date of Birth: 1945-09-23 Referring Provider (SLP): Eppie Gibson, MD   Encounter Date: 10/14/2020   End of Session - 10/14/20 1440    Visit Number 3    Number of Visits 7    Date for SLP Re-Evaluation 10/16/20   90 daya   SLP Start Time 1319    SLP Stop Time  1350    SLP Time Calculation (min) 31 min    Activity Tolerance Patient tolerated treatment well           Past Medical History:  Diagnosis Date  . Adenomatous colon polyp   . Cataract   . Diabetes mellitus   . Emphysema   . Emphysema of lung ([place])   . Erectile dysfunction   . Hyperlipidemia   . Hypertension   . Peripheral vascular disease ([place])   . Pneumonia   . Pulmonary nodule     Past Surgical History:  Procedure Laterality Date  . ABDOMINAL AORTIC ANEURYSM REPAIR  2004  . COLONOSCOPY  multiple since 2004    There were no vitals filed for this visit.   Subjective Assessment - 10/14/20 1410    Subjective Pt remains as flaccid on rt as previous session.    Currently in Pain? No/denies                 ADULT SLP TREATMENT - 10/14/20 1410      General Information   Behavior/Cognition Alert;Cooperative;Pleasant mood      Treatment Provided   Treatment provided Dysphagia      Dysphagia Treatment   Temperature Spikes Noted No    Respiratory Status Room air    Oral Cavity - Dentition Missing dentition    Treatment Methods Skilled observation;Patient/caregiver education;Therapeutic exercise    Patient observed directly with PO's Yes    Type of PO's observed Regular    Oral Phase Signs & Symptoms --   oral intake of liquids on lt and not midline due to rt labial weakness however pt did not have labial pressing with fingers today; minmal labial  residual with puree not noted by pt - req'd cues from SLP   Pharyngeal Phase Signs & Symptoms Other (comment)   none noted   Other treatment/comments Pt mostly liquid diet continues - 3500-3800 calories/day.He is eating some dys I-II items such as grits, scrambled eggs, applesauce. SLP encouraged pt to cont with diet as tolerated given surgical changes. Pt req'd modified independence with HEP today. He told SLP 3 overt s/sx of aspiration PNA.      Dysphagia Recommendations   Diet recommendations --   as tolerated   Liquids provided via Straw;Cup    Medication Administration --   as tolerated   Compensations Slow rate;Small sips/bites   oral intake on lt side     Progression Toward Goals   Progression toward goals Progressing toward goals              SLP Short Term Goals - 10/14/20 1424      SLP SHORT TERM GOAL #1   Title pt will complete HEP with rare min A    Period --   sesions, for all STGs   Status Achieved      SLP SHORT TERM GOAL #2   Title pt will tell SLP why  pt is completing HEP with modified independence    Status Achieved      SLP SHORT TERM GOAL #3   Title pt will describe 3 overt s/s aspiration PNA with modified independence    Status Achieved            SLP Long Term Goals - 10/14/20 1439      SLP LONG TERM GOAL #1   Title pt will complete HEP with modified independence over two visits    Time 2    Period --   sessions, for all LTGs   Status On-going      SLP LONG TERM GOAL #2   Title pt will describe how to modify HEP over time, and the timeline associated with reduction in HEP frequency with modified independence over two sessions    Time 4    Status On-going            Plan - 10/14/20 1440    Clinical Impression Statement At this time pt swallowing is deemed [facility] with water and dys I items. SLP reviewed Ryann's individualized HEP for dysphagia and pt completed each exercise on their own with modified independence. There are no overt s/s  aspiration reported by pt at this time. Frederick says he no longer has to use digital closure for rt labial margins with liquids. Data indicate that pt's swallow ability will likely decrease over the course of radiation therapy and could very well decline over time following conclusion of their radiation therapy due to muscle disuse atrophy and/or muscle fibrosis. Pt will cont to need to be seen by SLP in order to assess safety of PO intake, assess the need for recommending any objective swallow assessment, and ensuring pt correctly completes the individualized HEP. Pt agrees with every other month therapy at this time.    Speech Therapy Frequency --   once every approx 8 weeks   Duration --   7 total visits   Treatment/Interventions Aspiration precaution training;Pharyngeal strengthening exercises;Diet toleration management by SLP;Trials of upgraded texture/liquids;Internal/external aids;Patient/family education;Compensatory strategies;SLP instruction and feedback    Potential to Achieve Goals Good    SLP Home Exercise Plan provided    Consulted and Agree with Plan of Care Patient           Patient will benefit from skilled therapeutic intervention in order to improve the following deficits and impairments:   Dysphagia, unspecified type    Problem List Patient Active Problem List   Diagnosis Date Noted  . IPF (idiopathic pulmonary fibrosis) ([place]) 09/30/2020  . 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 Coronary artery calcification 11/16/2018  . Pulmonary nodules 01/22/2014  . Personal history of adenomatous colonic polyps 12/24/2011  . HTN (hypertension) 04/21/2011  . Hyperlipidemia 04/21/2011    [facility] ,[place], [place]  10/14/2020, 2:42 PM  [facility] [address] [place], [place], [address] Phone: [phone]   Fax:  [phone]   Name: Geoffrey Wood MRN: [id number] Date of Birth: 01-May-1946

## 2020-10-15 ENCOUNTER — Other Ambulatory Visit: Payer: Self-pay

## 2020-10-15 ENCOUNTER — Inpatient Hospital Stay: Payer: Medicare Other | Attending: Hematology

## 2020-10-15 DIAGNOSIS — Z801 Family history of malignant neoplasm of trachea, bronchus and lung: Secondary | ICD-10-CM | POA: Diagnosis not present

## 2020-10-15 DIAGNOSIS — Z8701 Personal history of pneumonia (recurrent): Secondary | ICD-10-CM | POA: Diagnosis not present

## 2020-10-15 DIAGNOSIS — R5383 Other fatigue: Secondary | ICD-10-CM | POA: Insufficient documentation

## 2020-10-15 DIAGNOSIS — Z85828 Personal history of other malignant neoplasm of skin: Secondary | ICD-10-CM | POA: Insufficient documentation

## 2020-10-15 DIAGNOSIS — Z86010 Personal history of colonic polyps: Secondary | ICD-10-CM | POA: Insufficient documentation

## 2020-10-15 DIAGNOSIS — D61818 Other pancytopenia: Secondary | ICD-10-CM | POA: Diagnosis present

## 2020-10-15 DIAGNOSIS — E46 Unspecified protein-calorie malnutrition: Secondary | ICD-10-CM | POA: Diagnosis not present

## 2020-10-15 DIAGNOSIS — E538 Deficiency of other specified B group vitamins: Secondary | ICD-10-CM

## 2020-10-15 DIAGNOSIS — D649 Anemia, unspecified: Secondary | ICD-10-CM

## 2020-10-15 DIAGNOSIS — Z87891 Personal history of nicotine dependence: Secondary | ICD-10-CM | POA: Diagnosis not present

## 2020-10-15 DIAGNOSIS — Z85810 Personal history of malignant neoplasm of tongue: Secondary | ICD-10-CM | POA: Diagnosis not present

## 2020-10-15 DIAGNOSIS — D693 Immune thrombocytopenic purpura: Secondary | ICD-10-CM

## 2020-10-15 DIAGNOSIS — C07 Malignant neoplasm of parotid gland: Secondary | ICD-10-CM | POA: Diagnosis present

## 2020-10-15 LAB — CBC WITH DIFFERENTIAL (CANCER CENTER ONLY)
Abs Immature Granulocytes: 0.41 10*3/uL — ABNORMAL HIGH (ref 0.00–0.07)
Basophils Absolute: 0.1 10*3/uL (ref 0.0–0.1)
Basophils Relative: 1 %
Eosinophils Absolute: 0 10*3/uL (ref 0.0–0.5)
Eosinophils Relative: 1 %
HCT: 32.9 % — ABNORMAL LOW (ref 39.0–52.0)
Hemoglobin: 10.4 g/dL — ABNORMAL LOW (ref 13.0–17.0)
Immature Granulocytes: 9 %
Lymphocytes Relative: 23 %
Lymphs Abs: 1 10*3/uL (ref 0.7–4.0)
MCH: 24.9 pg — ABNORMAL LOW (ref 26.0–34.0)
MCHC: 31.6 g/dL (ref 30.0–36.0)
MCV: 78.9 fL — ABNORMAL LOW (ref 80.0–100.0)
Monocytes Absolute: 0.5 10*3/uL (ref 0.1–1.0)
Monocytes Relative: 11 %
Neutro Abs: 2.5 10*3/uL (ref 1.7–7.7)
Neutrophils Relative %: 55 %
Platelet Count: 82 10*3/uL — ABNORMAL LOW (ref 150–400)
RBC: 4.17 MIL/uL — ABNORMAL LOW (ref 4.22–5.81)
RDW: 27.3 % — ABNORMAL HIGH (ref 11.5–15.5)
WBC Count: 4.5 10*3/uL (ref 4.0–10.5)
nRBC: 13.4 % — ABNORMAL HIGH (ref 0.0–0.2)

## 2020-10-15 LAB — CMP (CANCER CENTER ONLY)
ALT: 10 U/L (ref 0–44)
AST: 16 U/L (ref 15–41)
Albumin: 2.9 g/dL — ABNORMAL LOW (ref 3.5–5.0)
Alkaline Phosphatase: 91 U/L (ref 38–126)
Anion gap: 8 (ref 5–15)
BUN: 16 mg/dL (ref 8–23)
CO2: 26 mmol/L (ref 22–32)
Calcium: 9.1 mg/dL (ref 8.9–10.3)
Chloride: 98 mmol/L (ref 98–111)
Creatinine: 1.09 mg/dL (ref 0.61–1.24)
GFR, Estimated: >60 mL/min (ref >60)
Glucose, Bld: 245 mg/dL — ABNORMAL HIGH (ref 70–99)
Potassium: 4.2 mmol/L (ref 3.5–5.1)
Sodium: 132 mmol/L — ABNORMAL LOW (ref 135–145)
Total Bilirubin: 1 mg/dL (ref 0.3–1.2)
Total Protein: 8.5 g/dL — ABNORMAL HIGH (ref 6.5–8.1)

## 2020-10-15 LAB — IRON AND TIBC
Iron: 50 ug/dL (ref 42–163)
Saturation Ratios: 22 % (ref 20–55)
TIBC: 225 ug/dL (ref 202–409)
UIBC: 175 ug/dL (ref 117–376)

## 2020-10-15 LAB — FERRITIN: Ferritin: 164 ng/mL (ref 24–336)

## 2020-10-15 LAB — VITAMIN B12: Vitamin B-12: 1392 pg/mL — ABNORMAL HIGH (ref 180–914)

## 2020-10-21 NOTE — Progress Notes (Signed)
  Patient Name: Geoffrey Wood MRN: [id number] DOB: 05-17-1946 Referring Physician: Francina Ames (Profile Not Attached) Date of Service: 09/06/2020 [facility], [place]                                                        End Of Treatment Note  Diagnoses: C07-Malignant neoplasm of parotid gland  Cancer Staging: Cancer Staging Malignant neoplasm of parotid gland [facility]) Staging form: Major Salivary Glands, AJCC 8th Edition - Pathologic: Stage IVA (pT3, pN2b, cM0) - Signed by Eppie Gibson, MD on 07/05/2020  Intent: Curative, post op.  Radiation Treatment Dates: 07/22/2020 through 09/06/2020 Site Technique Total Dose (Gy) Dose per Fx (Gy) Completed Fx Beam Energies  Neck: HN_R_ [place] IMRT 66/66 2 33/33 6X   Narrative: The patient tolerated radiation therapy relatively well.   Plan: The patient will follow-up with radiation oncology in 2 weeks  -----------------------------------  Eppie Gibson, MD

## 2020-10-24 ENCOUNTER — Encounter: Payer: Self-pay | Admitting: Hematology

## 2020-10-29 ENCOUNTER — Inpatient Hospital Stay: Payer: Medicare Other

## 2020-10-29 NOTE — Progress Notes (Signed)
Nutrition Follow-up:  Patient with right parotid mass, s/p surgery.  Patient has completed radiation (09/06/20)  Followed by Dr Isidore Moos and Irene Limbo (anemia and thrombocytopenia)  Spoke with wife via phone.  Wife reports that for the past 2 weeks patient has been eating 4000 calories (prune juice 180 calorie, homemade smoothie with ice cream whole milk, benecalorie, frozen strawberries and sugar 1307 calories), milkshake with benecalorie from [employer] 1180 calories, chocolate pie 300 calories and homemade smoothie 1307 calories, and prune juice 180 calories total 4457 calories yesterday per patient calculation. Wife reports that patient can taste a little bit of strawberry flavor, chocolate flavor and pineapple flavor.  Patient has been able to eat peanuts.  Wife prepares them with ensure that he takes golfing for snack.  She says he has not been playing as much golf due to the weather recently.    Medications: remeron   Labs: reviewed  Anthropometrics:   Weight per wife at home 137 lb on 3/14  144 lb on 2/14 Dr Isidore Moos  148 lb 3.2 oz on 2/4 (Dr Isidore Moos) 156 lb on 12/22 155 lb on 12/20 153 lb on 12/13 156 lb on 11/29   Estimated energy needs: Kcals: 2100-2485 Protein: 105-124 g Fluid: 2.1 L   NUTRITION DIAGNOSIS: Inadequate oral intake continues with weight decline per wife's scale   INTERVENTION:  Ask wife to double check calculations for total calories and correct portion sizes for accuracy. With intake of 4000 calories per day,  190% above estimated energy needs Would anticipate weight gain if patient consistently getting this many calories daily.  Encouragement for patient to continue high calories and protein Will provide samples of [employer] 1.4 shake for patient to try. Shakes have better micronutrient profile and can be used as sole source nutrition.  Patient mostly consuming liquid diet. Can use this shake as base of smoothies.   Contact information provided    MONITORING,  EVALUATION, GOAL: weight trends, intake   NEXT VISIT: Tuesday, April 12, phone call [employer] B. Zenia Resides, [place], [place] Registered Dietitian [phone] (mobile)

## 2020-11-11 ENCOUNTER — Other Ambulatory Visit: Payer: Self-pay | Admitting: *Deleted

## 2020-11-11 DIAGNOSIS — D649 Anemia, unspecified: Secondary | ICD-10-CM

## 2020-11-11 DIAGNOSIS — E538 Deficiency of other specified B group vitamins: Secondary | ICD-10-CM

## 2020-11-11 DIAGNOSIS — D693 Immune thrombocytopenic purpura: Secondary | ICD-10-CM

## 2020-11-11 NOTE — Progress Notes (Signed)
HEMATOLOGY/ONCOLOGY CLINIC NOTE  Date of Service: 11/12/2020  Patient Care Team: Crist Infante, MD as PCP - General (Internal Medicine) Nahser, Wonda Cheng, MD as PCP - Cardiology (Cardiology) Eppie Gibson, MD as Consulting Physician (Radiation Oncology) Malmfelt, Stephani Police, RN as Oncology Nurse Navigator  CHIEF COMPLAINTS/PURPOSE OF CONSULTATION:  Anemia and Thrombocytopenia  HISTORY OF PRESENTING ILLNESS:  Geoffrey Wood is a wonderful 75 y.o. male who has been referred to [place] by Caprice Beaver, AG-NP for evaluation and management of anemia and thrombocytopenia. Pt is accompanied today by his wife. The pt reports that he is doing well overall.   The pt reports that he was not told of his thrombocytopenia prior to his Parotidectomy. Pt first noticed enlargement of his parotid gland in August of this year. 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 They plan on grafting skin over the previously excised area, then completing radiation therapy for positive margins. Pt was started on Celebrex post-op and has already discontinued. He was diagnosed with double pneumonia last week when increased shortness of breath was observed. Pt was given a Z Pack and Augmentin. He has been completing these medications as prescribed.   Prior to the Parotidectomy Dr. Joylene Draft lowered his Lipitor and Synjardy dosages by half. He denies any other medication changes prior to his surgery. Pt was previously told about a Vitamin B12 deficiency and was placed on a B12 supplement by Dr. Joylene Draft.   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   Most recent lab results (06/14/2020) of CBC is as follows: all values are WNL except for RBC at 3.68, Hgb at 9.7, HCT at 29.9, RDW at 28.0, PLT at 70.K, nRBC at 8.9, Abs Immature Granulocytes at 0.21K, Sodium at 131, Glucose at 138, Calcium at  8.5. 05/31/2020 Hgb at 11.0, PLT at 41K.  On review of systems, pt reports delicate skin, bruising and denies bloody/black stools, hematuria, nose bleeds, gum bleeds and any other symptoms.   On PMHx the pt reports Diabetes, Emphysema, HLD, HTN, Peripheral vascular disease, Tongue cancer, Skin cancer. On Social Hx the pt reports that he was a previous 50+ year smoker. He drinks alcohol a few times per year. On Family Hx the pt reports no known bleeding or blood clotting disorders.   INTERVAL HISTORY:  Geoffrey Wood is a wonderful 75 y.o. male who is here for evaluation and management of anemia and thrombocytopenia. We are joined today by his wife. The patient's last visit with [place] was on 09/17/2020. The pt reports that he is doing well overall.  The pt received two doses of IV G and his counts were on the mend as of 10/15/2020.  The pt reports that he is much weaker as of the last month. The pt notes that he plays golf only once weekly now and that takes all of his energy. The pt used to be very active with travels and work and a grandchild, but now he just sits all day and there is nothing of interest to get him moving. The pt notes he feels warmer weather will help improve his mood and physical abilities. The pt notes that he does not care to go out at all at this time. The pt notes his taste and appetite is slowly improving and his weight has been improving. He is currently at around 4000 calories  daily. The pt currently sleeps around 10-12 hours each night with help of the Remeron. The pt is very discouraged due to inability to hear, constant nose run. The pt is getting a repeat PET scan in April.The pt notes the biggest issue he has is there is not one place for the information to come together, but visits and info from surgeon, radiation, and oncology.  Lab results today 11/12/2020 of CBC w/diff and CMP is as follows: all values are WNL except for Glucose of 187, Calcium of 8.8, Albumin of  2.8, Total Bilirubin of 1.3, RBC of 3.98, Hgb of 9.9, HCT of 31.2, MCV of 78.4, MCH of 24.9, RDW of 26.9, Plt of 82K, nRBC of 14.4. 11/12/2020 Iron of 38, Sat Ratio of 17. 11/12/2020 Ferritin 141 11/12/2020 Vitamin B12 1610  On review of systems, pt reports discouragement, fatigue and denies bleeding issues, abdominal pain, leg swelling, and any other symptoms.  MEDICAL HISTORY:  Past Medical History:  Diagnosis Date  . Adenomatous colon polyp   . Cataract   . Diabetes mellitus   . Emphysema   . Emphysema of lung ([place])   . Erectile dysfunction   . Hyperlipidemia   . Hypertension   . Peripheral vascular disease ([place])   . Pneumonia   . Pulmonary nodule     SURGICAL HISTORY: Past Surgical History:  Procedure Laterality Date  . ABDOMINAL AORTIC ANEURYSM REPAIR  2004  . COLONOSCOPY  multiple since 2004    SOCIAL HISTORY: Social History   Socioeconomic History  . Marital status: Married    Spouse name: Not on file  . Number of children: Not on file  . Years of education: Not on file  . Highest education level: Not on file  Occupational History  . Occupation: Scientist, clinical (histocompatibility and immunogenetics): Designer, television/film set  Tobacco Use  . Smoking status: Former Smoker    Packs/day: 1.50    Years: 60.00    Pack years: 90.00    Types: Cigarettes    Quit date: 09/17/2014    Years since quitting: 6.1  . Smokeless tobacco: Never Used  Vaping Use  . Vaping Use: Never used  Substance and Sexual Activity  . Alcohol use: Yes    Comment: not even 1 drink a week  . Drug use: No  . Sexual activity: Not Currently  Other Topics Concern  . Not on file  Social History Narrative  . Not on file   Social Determinants of Health   Financial Resource Strain: Not on file  Food Insecurity: No Food Insecurity  . Worried About Charity fundraiser in the Last Year: Never true  . Ran Out of Food in the Last Year: Never true  Transportation Needs: No Transportation Needs  . Lack of Transportation  (Medical): No  . Lack of Transportation (Non-Medical): No  Physical Activity: Not on file  Stress: Not on file  Social Connections: Not on file  Intimate Partner Violence: Not on file    FAMILY HISTORY: Family History  Problem Relation Age of Onset  . Heart failure Mother   . Diabetes Mother   . Cancer Father        lung  . Colon cancer Neg Hx   . Esophageal cancer Neg Hx   . Liver cancer Neg Hx   . Pancreatic cancer Neg Hx   . Rectal cancer Neg Hx   . Stomach cancer Neg Hx     ALLERGIES:  has No Known Allergies.  MEDICATIONS:  Current Outpatient Medications  Medication Sig Dispense Refill  . acetaminophen (TYLENOL) 325 MG tablet Take 325 mg by mouth every 6 (six) hours as needed for mild pain.    [name] 62.5-25 MCG/INH AEPB Take 1 puff by mouth daily.    . Ascorbic Acid (VITAMIN C) 500 MG CAPS Take 1 capsule by mouth 2 (two) times daily.    . Cholecalciferol (VITAMIN D PO) Take 800 Units by mouth daily.    . Cyanocobalamin (VITAMIN B 12 PO) Take 500 mcg by mouth daily.    . ERGOCALCIFEROL PO Take 800 Units by mouth daily.    . iron polysaccharides (NIFEREX) 150 MG capsule Take 1 capsule (150 mg total) by mouth daily. 30 capsule 3  . Melatonin 10 MG TABS Take 10 mg by mouth at bedtime as needed (sleep).    . mirtazapine (REMERON) 15 MG tablet Take 15 mg by mouth at bedtime.    Glory Rosebush VERIO test strip      Current Facility-Administered Medications  Medication Dose Route Frequency Provider Last Rate Last Admin  . 0.9 %  sodium chloride infusion  500 mL Intravenous Once Gatha Mayer, MD        REVIEW OF SYSTEMS:   10 Point review of Systems was done is negative except as noted above.  PHYSICAL EXAMINATION: ECOG PERFORMANCE STATUS: 1 - Symptomatic but completely ambulatory  . Vitals:   11/12/20 1401  BP: 104/62  Pulse: 86  Resp: 20  Temp: (!) 97.2 F (36.2 C)  SpO2: 94%   Filed Weights   11/12/20 1401  Weight: 143 lb 9.6 oz (65.1 kg)   .Body  mass index is 19.48 kg/m.   Exam was given in a chair.  GENERAL:alert, in no acute distress and comfortable SKIN: no acute rashes, no significant lesions EYES: conjunctiva are pink and non-injected, sclera anicteric OROPHARYNX: MMM, no exudates, no oropharyngeal erythema or ulceration NECK: supple, no JVD LYMPH:  no palpable lymphadenopathy in the cervical, axillary or inguinal regions LUNGS: clear to auscultation b/l with normal respiratory effort HEART: regular rate & rhythm ABDOMEN:  normoactive bowel sounds , non tender, not distended. Extremity: no pedal edema PSYCH: alert & oriented x 3 with fluent speech NEURO: no focal motor/sensory deficits  LABORATORY DATA:  I have reviewed the data as listed  . CBC Latest Ref Rng & Units 11/12/2020 10/15/2020 10/09/2020  WBC 4.0 - 10.5 K/uL 4.7 4.5 3.1(L)  Hemoglobin 13.0 - 17.0 g/dL 9.9(L) 10.4(L) 9.0(L)  Hematocrit 39.0 - 52.0 % 31.2(L) 32.9(L) 28.7(L)  Platelets 150 - 400 K/uL 82(L) 82(L) 73(L)    . CMP Latest Ref Rng & Units 11/12/2020 10/15/2020 10/09/2020  Glucose 70 - 99 mg/dL 187(H) 245(H) 252(H)  BUN 8 - 23 mg/dL 14 16 15   Creatinine 0.61 - 1.24 mg/dL 0.94 1.09 0.88  Sodium 135 - 145 mmol/L 135 132(L) 132(L)  Potassium 3.5 - 5.1 mmol/L 4.6 4.2 3.9  Chloride 98 - 111 mmol/L 99 98 100  CO2 22 - 32 mmol/L 26 26 24   Calcium 8.9 - 10.3 mg/dL 8.8(L) 9.1 8.4(L)  Total Protein 6.5 - 8.1 g/dL 7.9 8.5(H) 7.2  Total Bilirubin 0.3 - 1.2 mg/dL 1.3(H) 1.0 0.9  Alkaline Phos 38 - 126 U/L 88 91 80  AST 15 - 41 U/L 18 16 14(L)  ALT 0 - 44 U/L 16 10 9    Component     Latest Ref Rng & Units 09/17/2020  Iron     42 -  163 ug/dL 30 (L)  TIBC     202 - 409 ug/dL 199 (L)  Saturation Ratios     20 - 55 % 15 (L)  UIBC     117 - 376 ug/dL 168  Vitamin B12     180 - 914 pg/mL 1,115 (H)  Ferritin     24 - 336 ng/mL 201      RADIOGRAPHIC STUDIES: I have personally reviewed the radiological images as listed and agreed with the findings in  the report. No results found.  ASSESSMENT & PLAN:   75 yo with pancytopenia in the context of being treated for cancer with surgery and radiation.  1. Anemia - multifactorial.  Likely from blood loss with recent surgeries + anemia of chronic inflammation + radiation + nutritional challenges in the setting of surgery and radiation for cell carcinoma   2. Thrombcytopenia  3. Leucopenia - primarily lymphopenia -- likely related to Radiation therapy.  3. Right Parotid Carcinoma: presented to Dr. Lucia Gaskins on 05/02/2020 with a developing right-sided parotid mass.  This was in the setting of a previous history of skin cancer on the right side of his scalp that was removed surgically 3 years ago.  He also had a history of a T1 tongue cancer, left side, that was removed surgically 9 years ago. Fine needle aspiration was performed at that time of the right parotid and revealed malignant cells consistent with non-small cell carcinoma.   -s/p surgery with subsequent flap closure. Right parotidectomy on 06/03/2020 revealed poorly differentiated carcinoma that measured up to 6 cm in greatest dimension. Lymphovascular invasion was present.  No mention of positive or negative perineural invasion.  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 Peripheral and deep linked margin of the specimen was positive for tumor. Right level 2A lymph nodes (14), right level 2B lymph nodes (9), and tissue in glenoid fossa were all negative for malignancy.   -s/p Radiation therapy for positive margins  -Will defer management to ENT and RadOnc   4.  Moderate protein calorie malnutrition -being followed by nutritional therapy.  Weight is starting to stabilize. . Wt Readings from Last 3 Encounters:  11/12/20 143 lb 9.6 oz (65.1 kg)  10/08/20 138 lb (62.6 kg)  10/02/20 144 lb 12.8 oz (65.7 kg)     PLAN: -Discussed pt labwork today,  11/12/2020; iron sat low, Hgb stable, WBC normal, other counts and chemistries stable. Ferritin and B12 pending. -Advised pt that anemia and lower blood counts is due to recent surgery and radiation. Will send referral. -Recommended pt see PT for increasing endurance and strength. -Recommended pt continue to f/u w Speech Therapist regarding eating and continued exercises for swallowing. -Recommended pt refer common questions to the nurse navigator Anderson Malta. -Discussed limiting factor for pt. Advised pt to direct specific questions towards specific specialists, such as radiation therapy to Dr. Lanell Persons, etc. -Discussed pt's fatigue and discouragement. Advised pt that 40% of people who undergo head and neck cancers often get depressed. -Advised pt that Hgb of 9.9 would not be cause of fatigue. This occurs around or below 8.0. -Advised pt that radiation and surgery related fatigue lasts around 1-2 months. -Continue daily Vitamin B12, B-Complex. -Hold off on Biopsy for now. -Will get IV Iron. -Will see back in 2 months with labs.   FOLLOW UP: IV INjectafer weekly x 2 doses Referral to cancer rehab RTC with Dr Irene Limbo with  labs in 2 months   The total time spent in the appointment was 40 minutes and more than 50% was on counseling and direct patient cares.   Sullivan Lone MD [place] AAHIVMS [facility] Hematology/Oncology Physician [facility]  (Office):       [phone] (Work cell):  [phone] (Fax):           [phone]  11/12/2020 2:28 PM  I, Reinaldo Raddle, am acting as scribe for Dr. Sullivan Lone, MD.    .I have reviewed the above documentation for accuracy and completeness, and I agree with the above. Brunetta Genera MD

## 2020-11-12 ENCOUNTER — Inpatient Hospital Stay (HOSPITAL_BASED_OUTPATIENT_CLINIC_OR_DEPARTMENT_OTHER): Payer: Medicare Other | Admitting: Hematology

## 2020-11-12 ENCOUNTER — Ambulatory Visit (INDEPENDENT_AMBULATORY_CARE_PROVIDER_SITE_OTHER): Payer: Medicare Other | Admitting: Otolaryngology

## 2020-11-12 ENCOUNTER — Inpatient Hospital Stay: Payer: Medicare Other

## 2020-11-12 ENCOUNTER — Other Ambulatory Visit: Payer: Self-pay

## 2020-11-12 VITALS — BP 104/62 | HR 86 | Temp 97.2°F | Resp 20 | Ht 72.0 in | Wt 143.6 lb

## 2020-11-12 DIAGNOSIS — E538 Deficiency of other specified B group vitamins: Secondary | ICD-10-CM

## 2020-11-12 DIAGNOSIS — D649 Anemia, unspecified: Secondary | ICD-10-CM

## 2020-11-12 DIAGNOSIS — D693 Immune thrombocytopenic purpura: Secondary | ICD-10-CM

## 2020-11-12 DIAGNOSIS — D500 Iron deficiency anemia secondary to blood loss (chronic): Secondary | ICD-10-CM | POA: Diagnosis not present

## 2020-11-12 DIAGNOSIS — D509 Iron deficiency anemia, unspecified: Secondary | ICD-10-CM | POA: Insufficient documentation

## 2020-11-12 DIAGNOSIS — D61818 Other pancytopenia: Secondary | ICD-10-CM | POA: Diagnosis not present

## 2020-11-12 LAB — CBC WITH DIFFERENTIAL (CANCER CENTER ONLY)
Abs Immature Granulocytes: 0.69 10*3/uL — ABNORMAL HIGH (ref 0.00–0.07)
Basophils Absolute: 0.1 10*3/uL (ref 0.0–0.1)
Basophils Relative: 2 %
Eosinophils Absolute: 0 10*3/uL (ref 0.0–0.5)
Eosinophils Relative: 0 %
HCT: 31.2 % — ABNORMAL LOW (ref 39.0–52.0)
Hemoglobin: 9.9 g/dL — ABNORMAL LOW (ref 13.0–17.0)
Immature Granulocytes: 15 %
Lymphocytes Relative: 20 %
Lymphs Abs: 1 10*3/uL (ref 0.7–4.0)
MCH: 24.9 pg — ABNORMAL LOW (ref 26.0–34.0)
MCHC: 31.7 g/dL (ref 30.0–36.0)
MCV: 78.4 fL — ABNORMAL LOW (ref 80.0–100.0)
Monocytes Absolute: 0.5 10*3/uL (ref 0.1–1.0)
Monocytes Relative: 11 %
Neutro Abs: 2.5 10*3/uL (ref 1.7–7.7)
Neutrophils Relative %: 52 %
Platelet Count: 82 10*3/uL — ABNORMAL LOW (ref 150–400)
RBC: 3.98 MIL/uL — ABNORMAL LOW (ref 4.22–5.81)
RDW: 26.9 % — ABNORMAL HIGH (ref 11.5–15.5)
WBC Count: 4.7 10*3/uL (ref 4.0–10.5)
nRBC: 14.4 % — ABNORMAL HIGH (ref 0.0–0.2)

## 2020-11-12 LAB — VITAMIN B12: Vitamin B-12: 1610 pg/mL — ABNORMAL HIGH (ref 180–914)

## 2020-11-12 LAB — IRON AND TIBC
Iron: 38 ug/dL — ABNORMAL LOW (ref 42–163)
Saturation Ratios: 17 % — ABNORMAL LOW (ref 20–55)
TIBC: 229 ug/dL (ref 202–409)
UIBC: 191 ug/dL (ref 117–376)

## 2020-11-12 LAB — CMP (CANCER CENTER ONLY)
ALT: 16 U/L (ref 0–44)
AST: 18 U/L (ref 15–41)
Albumin: 2.8 g/dL — ABNORMAL LOW (ref 3.5–5.0)
Alkaline Phosphatase: 88 U/L (ref 38–126)
Anion gap: 10 (ref 5–15)
BUN: 14 mg/dL (ref 8–23)
CO2: 26 mmol/L (ref 22–32)
Calcium: 8.8 mg/dL — ABNORMAL LOW (ref 8.9–10.3)
Chloride: 99 mmol/L (ref 98–111)
Creatinine: 0.94 mg/dL (ref 0.61–1.24)
GFR, Estimated: >60 mL/min (ref >60)
Glucose, Bld: 187 mg/dL — ABNORMAL HIGH (ref 70–99)
Potassium: 4.6 mmol/L (ref 3.5–5.1)
Sodium: 135 mmol/L (ref 135–145)
Total Bilirubin: 1.3 mg/dL — ABNORMAL HIGH (ref 0.3–1.2)
Total Protein: 7.9 g/dL (ref 6.5–8.1)

## 2020-11-12 LAB — FERRITIN: Ferritin: 141 ng/mL (ref 24–336)

## 2020-11-13 ENCOUNTER — Telehealth: Payer: Self-pay | Admitting: Hematology

## 2020-11-13 NOTE — Telephone Encounter (Signed)
Scheduled follow-up appointments per 3/29 los. Patient's wife is aware.

## 2020-11-14 ENCOUNTER — Other Ambulatory Visit: Payer: Self-pay

## 2020-11-14 ENCOUNTER — Ambulatory Visit (INDEPENDENT_AMBULATORY_CARE_PROVIDER_SITE_OTHER): Payer: Medicare Other | Admitting: Otolaryngology

## 2020-11-14 VITALS — Temp 97.9°F

## 2020-11-14 DIAGNOSIS — J310 Chronic rhinitis: Secondary | ICD-10-CM

## 2020-11-14 DIAGNOSIS — J3489 Other specified disorders of nose and nasal sinuses: Secondary | ICD-10-CM | POA: Diagnosis not present

## 2020-11-14 DIAGNOSIS — H6123 Impacted cerumen, bilateral: Secondary | ICD-10-CM | POA: Diagnosis not present

## 2020-11-14 DIAGNOSIS — J34829 Nasal valve collapse, unspecified: Secondary | ICD-10-CM

## 2020-11-14 NOTE — Progress Notes (Signed)
HPI: Geoffrey Wood is a 75 y.o. male who returns today for evaluation of blockage of his right ear.  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  He underwent surgery at [facility] with Dr. Nicolette Bang several months ago.  This involved sacrificing a large portion of the facial nerve. He also received postoperative radiation therapy.  Since that time he is always had some hearing problems and wears hearing aids but his hearing in his right ear has been worse.  [facility]  He was referred here for cleaning the ear canals. He also complains of a chronic draining nose and difficulty breathing through the right nostril and is tried Breathe Right strips that seem to help a little bit..  Past Medical History:  Diagnosis Date  . Adenomatous colon polyp   . Cataract   . Diabetes mellitus   . Emphysema   . Emphysema of lung ([place])   . Erectile dysfunction   . Hyperlipidemia   . Hypertension   . Peripheral vascular disease ([place])   . Pneumonia   . Pulmonary nodule    Past Surgical History:  Procedure Laterality Date  . ABDOMINAL AORTIC ANEURYSM REPAIR  2004  . COLONOSCOPY  multiple since 2004   Social History   Socioeconomic History  . Marital status: Married    Spouse name: Not on file  . Number of children: Not on file  . Years of education: Not on file  . Highest education level: Not on file  Occupational History  . Occupation: Scientist, clinical (histocompatibility and immunogenetics): Designer, television/film set  Tobacco Use  . Smoking status: Former Smoker    Packs/day: 1.50    Years: 60.00    Pack years: 90.00    Types: Cigarettes    Quit date: 09/17/2014    Years since quitting: 6.1  . Smokeless tobacco: Never Used  Vaping Use  . Vaping Use: Never used  Substance and Sexual Activity  . Alcohol use: Yes    Comment: not even 1 drink a week  . Drug use: No  . Sexual activity:  Not Currently  Other Topics Concern  . Not on file  Social History Narrative  . Not on file   Social Determinants of Health   Financial Resource Strain: Not on file  Food Insecurity: No Food Insecurity  . Worried About Charity fundraiser in the Last Year: Never true  . Ran Out of Food in the Last Year: Never true  Transportation Needs: No Transportation Needs  . Lack of Transportation (Medical): No  . Lack of Transportation (Non-Medical): No  Physical Activity: Not on file  Stress: Not on file  Social Connections: Not on file   Family History  Problem Relation Age of Onset  . Heart failure Mother   . Diabetes Mother   . Cancer Father        lung  . Colon cancer Neg Hx   . Esophageal cancer Neg Hx   . Liver cancer Neg Hx   . Pancreatic cancer Neg Hx   . Rectal cancer Neg Hx   . Stomach cancer Neg Hx    No Known Allergies Prior to Admission medications   Medication Sig Start Date End Date Taking? Authorizing Provider  acetaminophen (TYLENOL) 325 MG tablet Take 325 mg by mouth every 6 (six) hours as needed for mild pain.  [provider]  Celedonio Miyamoto 62.5-25 MCG/INH AEPB Take 1 puff by mouth daily. 08/20/17   [provider]  Ascorbic Acid (VITAMIN C) 500 MG CAPS Take 1 capsule by mouth 2 (two) times daily.    [provider]  Cholecalciferol (VITAMIN D PO) Take 800 Units by mouth daily.    [provider]  Cyanocobalamin (VITAMIN B 12 PO) Take 500 mcg by mouth daily.    [provider]  ERGOCALCIFEROL PO Take 800 Units by mouth daily.    [provider]  iron polysaccharides (NIFEREX) 150 MG capsule Take 1 capsule (150 mg total) by mouth daily. 08/07/20   Brunetta Genera, MD  Melatonin 10 MG TABS Take 10 mg by mouth at bedtime as needed (sleep).    [provider]  mirtazapine (REMERON) 15 MG tablet Take 45 mg by mouth at bedtime. 09/03/20   [provider]  ONETOUCH VERIO test strip  07/12/18    [provider]     Positive ROS: Otherwise negative  All other systems have been reviewed and were otherwise negative with the exception of those mentioned in the HPI and as above.  Physical Exam: Constitutional: Alert, well-appearing, no acute distress Ears: External ears without lesions or tenderness.  Left ear canal is widely patent with minimal wax buildup that was cleaned with a curette and forceps.  Right ear canal is small with the excision area going all way down to the bony ear canal.  He still has an open wound just anterior and superior to the ear canal which is draining a little bit of clear liquid exudate that has a tendency to drain into the ear canal.  I was able to clean the ear canal with a #5 suction and visualize the TM.  Following cleaning the ear canal the hearing aids seem to work much better. Nasal: External nose without lesions. Septum with mild deformity.  Has collapse of the nasal valve especially the right side.  Clear mucus discharge within the nasal cavity.  Both middle meatus regions are clear.. Oral: Lips and gums without lesions. Tongue and palate mucosa without lesions. Posterior oropharynx clear. Neck: No palpable adenopathy or masses.  He still has an approximate 2 x 4 cm open wound in the right parotid area just anterior to the ear. Respiratory: Breathing comfortably  Skin: No facial/neck lesions or rash noted.  Cerumen impaction removal  Date/Time: 11/14/2020 11:31 AM Performed by: Rozetta Nunnery, MD Authorized by: Rozetta Nunnery, MD   Consent:    Consent obtained:  Verbal   Consent given by:  Patient   Risks discussed:  Pain and bleeding Procedure details:    Location:  L ear and R ear   Procedure type: curette, suction and forceps   Post-procedure details:    Inspection:  TM intact and canal normal   Hearing quality:  Improved   Patient tolerance of procedure:  Tolerated well, no immediate complications Comments:      Left ear canal was cleaned with a curette and forceps.  Left ear canal and TM are normal.  Right ear canal is abnormal secondary to previous surgery with a stenotic ear canal that was cleaned with suction.  The TM was intact and clear otherwise.    Assessment: Right ear canal obstruction with drainage.  Distal torsion of the normal right ear anatomy secondary to surgery for removal of a metastatic squamous cell carcinoma to a large right parotid node. 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 Chronic rhinitis  Plan: For the nasal valve collapse suggested trying air max which is an intranasal stenting device that will allow him to breathe better at night when he sleeps that he has most of his trouble breathing. Concerning the nasal drainage which is clear recommended use of Flonase or Nasacort along with saline rinses and antihistamines as needed. For the ear canal recommend cleaning this with alcohol vinegar maybe once a week. Suggested applying antibiotic ointment to the open wound just in front of the ear. He will follow-up in 2 to 3 weeks for recheck and cleaning the right ear canal.   Radene Journey, MD

## 2020-11-19 ENCOUNTER — Inpatient Hospital Stay: Payer: Medicare Other | Attending: Hematology

## 2020-11-19 ENCOUNTER — Other Ambulatory Visit: Payer: Self-pay

## 2020-11-19 VITALS — BP 95/59 | HR 70 | Temp 98.2°F | Resp 18

## 2020-11-19 DIAGNOSIS — D649 Anemia, unspecified: Secondary | ICD-10-CM | POA: Insufficient documentation

## 2020-11-19 DIAGNOSIS — D500 Iron deficiency anemia secondary to blood loss (chronic): Secondary | ICD-10-CM

## 2020-11-19 MED ORDER — EPINEPHRINE 0.3 MG/0.3ML IJ SOAJ: 0.3000 mg | Freq: Once | INTRAMUSCULAR | Status: DC | PRN

## 2020-11-19 MED ORDER — SODIUM CHLORIDE 0.9 % IV SOLN
Freq: Once | INTRAVENOUS | Status: AC
  Filled 2020-11-19: qty 250

## 2020-11-19 MED ORDER — FAMOTIDINE IN NACL 20-0.9 MG/50ML-% IV SOLN: 20.0000 mg | Freq: Once | INTRAVENOUS | Status: DC | PRN

## 2020-11-19 MED ORDER — LORATADINE 10 MG PO TABS: 10.0000 mg | ORAL_TABLET | Freq: Once | ORAL | Status: DC

## 2020-11-19 MED ORDER — ALBUTEROL SULFATE (2.5 MG/3ML) 0.083% IN NEBU
2.5000 mg | INHALATION_SOLUTION | Freq: Once | RESPIRATORY_TRACT | Status: DC | PRN
  Filled 2020-11-19: qty 3

## 2020-11-19 MED ORDER — DIPHENHYDRAMINE HCL 50 MG/ML IJ SOLN: 50.0000 mg | Freq: Once | INTRAMUSCULAR | Status: DC | PRN

## 2020-11-19 MED ORDER — FERRIC CARBOXYMALTOSE 750 MG/15ML IV SOLN
750.0000 mg | Freq: Once | INTRAVENOUS | Status: AC
Start: 2020-11-19 — End: 2020-11-19
  Administered 2020-11-19: 750 mg via INTRAVENOUS
  Filled 2020-11-19: qty 15

## 2020-11-19 MED ORDER — SODIUM CHLORIDE 0.9 % IV SOLN
Freq: Once | INTRAVENOUS | Status: DC | PRN
  Filled 2020-11-19: qty 250

## 2020-11-19 MED ORDER — ACETAMINOPHEN 325 MG PO TABS: 650.0000 mg | ORAL_TABLET | Freq: Once | ORAL | Status: DC

## 2020-11-19 MED ORDER — METHYLPREDNISOLONE SODIUM SUCC 125 MG IJ SOLR: 125.0000 mg | Freq: Once | INTRAMUSCULAR | Status: DC | PRN

## 2020-11-19 NOTE — Progress Notes (Signed)
Pt came in for iron infusion today, refused pre-meds, see MAR. Pt tolerated infusion, monitored post infusion for 30 minutes. No reaction noted. Pt discharged home.

## 2020-11-19 NOTE — Patient Instructions (Signed)
Ferric carboxymaltose injection What is this medicine? FERRIC CARBOXYMALTOSE (ferr-ik car-box-ee-mol-toes) is an iron complex. Iron is used to make healthy red blood cells, which carry oxygen and nutrients throughout the body. This medicine is used to treat anemia in people with chronic kidney disease or people who cannot take iron by mouth. This medicine may be used for other purposes; ask your health care provider or pharmacist if you have questions. COMMON BRAND NAME(S): Injectafer What should I tell my health care provider before I take this medicine? They need to know if you have any of these conditions:  high levels of iron in the blood  liver disease  an unusual or allergic reaction to iron, other medicines, foods, dyes, or preservatives  pregnant or trying to get pregnant  breast-feeding How should I use this medicine? This medicine is for infusion into a vein. It is given by a health care professional in a hospital or clinic setting. Talk to your pediatrician regarding the use of this medicine in children. Special care may be needed. Overdosage: If you think you have taken too much of this medicine contact a poison control center or emergency room at once. NOTE: This medicine is only for you. Do not share this medicine with others. What if I miss a dose? Keep appointments for follow-up doses. It is important not to miss your dose. Call your care team if you are unable to keep an appointment. What may interact with this medicine? Do not take this medicine with any of the following medications:  deferoxamine  dimercaprol  other iron products This list may not describe all possible interactions. Give your health care provider a list of all the medicines, herbs, non-prescription drugs, or dietary supplements you use. Also tell them if you smoke, drink alcohol, or use illegal drugs. Some items may interact with your medicine. What should I watch for while using this  medicine? Visit your doctor or health care professional regularly. Tell your doctor if your symptoms do not start to get better or if they get worse. You may need blood work done while you are taking this medicine. You may need to follow a special diet. Talk to your doctor. Foods that contain iron include: whole grains/cereals, dried fruits, beans, or peas, leafy green vegetables, and organ meats (liver, kidney). What side effects may I notice from receiving this medicine? Side effects that you should report to your doctor or health care professional as soon as possible:  allergic reactions like skin rash, itching or hives, swelling of the face, lips, or tongue  dizziness  facial flushing Side effects that usually do not require medical attention (report to your doctor or health care professional if they continue or are bothersome):  changes in taste  constipation  headache  nausea, vomiting  pain, redness, or irritation at site where injected This list may not describe all possible side effects. Call your doctor for medical advice about side effects. You may report side effects to FDA at 1-800-FDA-1088. Where should I keep my medicine? This drug is given in a hospital or clinic and will not be stored at home. NOTE: This sheet is a summary. It may not cover all possible information. If you have questions about this medicine, talk to your doctor, pharmacist, or health care provider.  2021 Elsevier/Gold Standard (2020-07-16 14:00:47)  

## 2020-11-26 ENCOUNTER — Telehealth: Payer: Self-pay | Admitting: Nutrition

## 2020-11-26 ENCOUNTER — Inpatient Hospital Stay: Payer: Medicare Other | Admitting: Nutrition

## 2020-11-26 ENCOUNTER — Other Ambulatory Visit: Payer: Self-pay

## 2020-11-26 ENCOUNTER — Inpatient Hospital Stay: Payer: Medicare Other

## 2020-11-26 VITALS — BP 94/52 | HR 72 | Temp 98.2°F | Resp 18 | Wt 143.8 lb

## 2020-11-26 DIAGNOSIS — D500 Iron deficiency anemia secondary to blood loss (chronic): Secondary | ICD-10-CM

## 2020-11-26 DIAGNOSIS — D649 Anemia, unspecified: Secondary | ICD-10-CM

## 2020-11-26 DIAGNOSIS — J189 Pneumonia, unspecified organism: Secondary | ICD-10-CM | POA: Diagnosis not present

## 2020-11-26 DIAGNOSIS — J441 Chronic obstructive pulmonary disease with (acute) exacerbation: Secondary | ICD-10-CM | POA: Diagnosis not present

## 2020-11-26 MED ORDER — SODIUM CHLORIDE 0.9 % IV SOLN
Freq: Once | INTRAVENOUS | Status: AC
  Filled 2020-11-26: qty 250

## 2020-11-26 MED ORDER — SODIUM CHLORIDE 0.9 % IV SOLN
INTRAVENOUS | Status: AC
  Filled 2020-11-26 (×2): qty 250

## 2020-11-26 MED ORDER — LORATADINE 10 MG PO TABS: 10.0000 mg | ORAL_TABLET | Freq: Once | ORAL | Status: AC

## 2020-11-26 MED ORDER — FERRIC CARBOXYMALTOSE 750 MG/15ML IV SOLN
750.0000 mg | Freq: Once | INTRAVENOUS | Status: AC
  Administered 2020-11-26: 750 mg via INTRAVENOUS
  Filled 2020-11-26: qty 15

## 2020-11-26 MED ORDER — ALBUTEROL SULFATE (2.5 MG/3ML) 0.083% IN NEBU
2.5000 mg | INHALATION_SOLUTION | RESPIRATORY_TRACT | Status: AC | PRN
  Filled 2020-11-26: qty 3

## 2020-11-26 MED ORDER — ALBUTEROL SULFATE HFA 108 (90 BASE) MCG/ACT IN AERS: 2.0000 | INHALATION_SPRAY | RESPIRATORY_TRACT | Status: DC | PRN

## 2020-11-26 MED ORDER — ACETAMINOPHEN 325 MG PO TABS: 650.0000 mg | ORAL_TABLET | Freq: Once | ORAL | Status: AC

## 2020-11-26 NOTE — Progress Notes (Unsigned)
1416 Pt arrived to infusion after walking from main entrance. O2 81%. Pt states he feels short of breath because he walked in to infusion recliner. Pt states he doesn't feel anymore short of breath than his usual. RN monitoring pt for recovery. Pt noted to have a runny nose and congested cough. Pt states, this is normal for him. Denies being exposed to COVID.   1422 O2 sats still not recovering. Highest on room air 83-85%. RN informed charge Sports administrator.   1425 Dr. Irene Limbo chairside to evaluate patient. O2 92-94% on 2L [place], HR 89, BP 105/71.  1435 Dr. Irene Limbo able to titrate pt back down to room air, O2 sats in the high 80s. WCTM.   [place] called Dr. Irene Limbo back to see patient, O2 sats in the mid 80s. Pt bouncing around from 85-88%, per Dr. Irene Limbo, will order an inhaler and for patient to check his O2 at home and follow up with Dr. Concepcion Living (his pulmonologist).  [place] started ferric carboxymaltose IV. Pt denies SOB, WCTM.

## 2020-11-26 NOTE — Telephone Encounter (Signed)
error 

## 2020-11-26 NOTE — Telephone Encounter (Signed)
Contacted patient's wife by telephone at request of patient.  She reports there have not been any changes since she spoke with RD several weeks ago.  Patient continues to drink majority of his calories consisting of prune juices, smoothies with ice cream, milk shakes with Benecalorie and homemade smoothies.  He is drinking 2 [employer] supplements daily.  She estimates calories are approximately 3500 daily.  Weight documented as 143.6 pounds on March 29.  Patient complains of taste alterations and dry mouth.  Nutrition diagnosis: Inadequate oral intake appears stable.  Intervention: Provided strategies for improving dry mouth and thickened saliva.  Encouraged patient to try pineapple juice and papaya nectar along with sugar-free ginger ale.  Also recommended other strategies for improving dry mouth especially using baking soda and salt water rinses.  Wife was appreciative and eager to try additional interventions.  Monitoring, evaluation, goals: Patient will tolerate adequate calories and protein to minimize weight loss.  Next visit: Telephone follow-up in about 2 weeks.  **Disclaimer: This note was dictated with voice recognition software. Similar sounding words can inadvertently be transcribed and this note may contain transcription errors which may not have been corrected upon publication of note.**

## 2020-11-26 NOTE — Progress Notes (Unsigned)
Pt received his additional fluids as directed per MD. Blood pressure still soft 90/50s. Doctor Magrinat was notified and suggested possible more fluids tomorrow. Pt is aware of complications that can occur with hypotension and has chosen to leave anyway. Pt stated " I will call if I need more fluids" pt discharged at this time.

## 2020-11-26 NOTE — Progress Notes (Unsigned)
Pt. BP low, 80/46 recheck BP 85/48, denies chest pain, dizziness, but slight shortness of breath noted. Dr. Irene Limbo in for observation, instructed to complete 250cc normal saline.  Pt. BP remains low 89/46, 95/51, denies chest pain, dizziness, and remailns slightly short of breath after 250cc IV fluids. Dr. Jana Hakim notified and pt. given additional 250 cc normal saline bolus.

## 2020-11-26 NOTE — Progress Notes (Signed)
See telephone note.

## 2020-11-26 NOTE — Patient Instructions (Signed)
Ferric carboxymaltose injection What is this medicine? FERRIC CARBOXYMALTOSE (ferr-ik car-box-ee-mol-toes) is an iron complex. Iron is used to make healthy red blood cells, which carry oxygen and nutrients throughout the body. This medicine is used to treat anemia in people with chronic kidney disease or people who cannot take iron by mouth. This medicine may be used for other purposes; ask your health care provider or pharmacist if you have questions. COMMON BRAND NAME(S): Injectafer What should I tell my health care provider before I take this medicine? They need to know if you have any of these conditions:  high levels of iron in the blood  liver disease  an unusual or allergic reaction to iron, other medicines, foods, dyes, or preservatives  pregnant or trying to get pregnant  breast-feeding How should I use this medicine? This medicine is for infusion into a vein. It is given by a health care professional in a hospital or clinic setting. Talk to your pediatrician regarding the use of this medicine in children. Special care may be needed. Overdosage: If you think you have taken too much of this medicine contact a poison control center or emergency room at once. NOTE: This medicine is only for you. Do not share this medicine with others. What if I miss a dose? Keep appointments for follow-up doses. It is important not to miss your dose. Call your care team if you are unable to keep an appointment. What may interact with this medicine? Do not take this medicine with any of the following medications:  deferoxamine  dimercaprol  other iron products This list may not describe all possible interactions. Give your health care provider a list of all the medicines, herbs, non-prescription drugs, or dietary supplements you use. Also tell them if you smoke, drink alcohol, or use illegal drugs. Some items may interact with your medicine. What should I watch for while using this  medicine? Visit your doctor or health care professional regularly. Tell your doctor if your symptoms do not start to get better or if they get worse. You may need blood work done while you are taking this medicine. You may need to follow a special diet. Talk to your doctor. Foods that contain iron include: whole grains/cereals, dried fruits, beans, or peas, leafy green vegetables, and organ meats (liver, kidney). What side effects may I notice from receiving this medicine? Side effects that you should report to your doctor or health care professional as soon as possible:  allergic reactions like skin rash, itching or hives, swelling of the face, lips, or tongue  dizziness  facial flushing Side effects that usually do not require medical attention (report to your doctor or health care professional if they continue or are bothersome):  changes in taste  constipation  headache  nausea, vomiting  pain, redness, or irritation at site where injected This list may not describe all possible side effects. Call your doctor for medical advice about side effects. You may report side effects to FDA at 1-800-FDA-1088. Where should I keep my medicine? This drug is given in a hospital or clinic and will not be stored at home. NOTE: This sheet is a summary. It may not cover all possible information. If you have questions about this medicine, talk to your doctor, pharmacist, or health care provider.  2021 Elsevier/Gold Standard (2020-07-16 14:00:47)  

## 2020-11-27 ENCOUNTER — Telehealth: Payer: Self-pay | Admitting: *Deleted

## 2020-11-27 NOTE — Telephone Encounter (Signed)
Called patient to inform of CT for 12-05-20 - arrival time- 12:15 pm @ [facility], patient to have water only four hours prior to test, patient to receive results from [name] on 12-06-20, lvm for a return call

## 2020-11-28 ENCOUNTER — Inpatient Hospital Stay (HOSPITAL_COMMUNITY)
Admission: EM | Admit: 2020-11-28 | Discharge: 2020-12-04 | DRG: 193 | Disposition: A | Discharge status: Home / Self Care | Payer: Medicare Other | Attending: Internal Medicine | Admitting: Internal Medicine

## 2020-11-28 ENCOUNTER — Emergency Department (HOSPITAL_COMMUNITY): Payer: Medicare Other

## 2020-11-28 ENCOUNTER — Encounter (HOSPITAL_COMMUNITY): Payer: Self-pay | Admitting: Emergency Medicine

## 2020-11-28 ENCOUNTER — Encounter: Payer: Self-pay | Admitting: Internal Medicine

## 2020-11-28 ENCOUNTER — Other Ambulatory Visit: Payer: Self-pay

## 2020-11-28 DIAGNOSIS — C07 Malignant neoplasm of parotid gland: Secondary | ICD-10-CM | POA: Diagnosis present

## 2020-11-28 DIAGNOSIS — D693 Immune thrombocytopenic purpura: Secondary | ICD-10-CM | POA: Diagnosis present

## 2020-11-28 DIAGNOSIS — I959 Hypotension, unspecified: Secondary | ICD-10-CM | POA: Diagnosis present

## 2020-11-28 DIAGNOSIS — D638 Anemia in other chronic diseases classified elsewhere: Secondary | ICD-10-CM | POA: Diagnosis present

## 2020-11-28 DIAGNOSIS — J918 Pleural effusion in other conditions classified elsewhere: Secondary | ICD-10-CM | POA: Diagnosis present

## 2020-11-28 DIAGNOSIS — Z8249 Family history of ischemic heart disease and other diseases of the circulatory system: Secondary | ICD-10-CM

## 2020-11-28 DIAGNOSIS — Z801 Family history of malignant neoplasm of trachea, bronchus and lung: Secondary | ICD-10-CM

## 2020-11-28 DIAGNOSIS — Z85828 Personal history of other malignant neoplasm of skin: Secondary | ICD-10-CM

## 2020-11-28 DIAGNOSIS — J439 Emphysema, unspecified: Secondary | ICD-10-CM | POA: Diagnosis present

## 2020-11-28 DIAGNOSIS — Z923 Personal history of irradiation: Secondary | ICD-10-CM

## 2020-11-28 DIAGNOSIS — J9621 Acute and chronic respiratory failure with hypoxia: Secondary | ICD-10-CM | POA: Diagnosis present

## 2020-11-28 DIAGNOSIS — E876 Hypokalemia: Secondary | ICD-10-CM | POA: Diagnosis present

## 2020-11-28 DIAGNOSIS — I2781 Cor pulmonale (chronic): Secondary | ICD-10-CM | POA: Diagnosis present

## 2020-11-28 DIAGNOSIS — R1310 Dysphagia, unspecified: Secondary | ICD-10-CM | POA: Diagnosis present

## 2020-11-28 DIAGNOSIS — E43 Unspecified severe protein-calorie malnutrition: Secondary | ICD-10-CM | POA: Diagnosis present

## 2020-11-28 DIAGNOSIS — Z20822 Contact with and (suspected) exposure to covid-19: Secondary | ICD-10-CM | POA: Diagnosis present

## 2020-11-28 DIAGNOSIS — R040 Epistaxis: Secondary | ICD-10-CM | POA: Diagnosis present

## 2020-11-28 DIAGNOSIS — J84112 Idiopathic pulmonary fibrosis: Secondary | ICD-10-CM | POA: Diagnosis present

## 2020-11-28 DIAGNOSIS — I10 Essential (primary) hypertension: Secondary | ICD-10-CM | POA: Diagnosis present

## 2020-11-28 DIAGNOSIS — E1165 Type 2 diabetes mellitus with hyperglycemia: Secondary | ICD-10-CM | POA: Diagnosis present

## 2020-11-28 DIAGNOSIS — Z974 Presence of external hearing-aid: Secondary | ICD-10-CM

## 2020-11-28 DIAGNOSIS — H9193 Unspecified hearing loss, bilateral: Secondary | ICD-10-CM | POA: Diagnosis present

## 2020-11-28 DIAGNOSIS — Z85810 Personal history of malignant neoplasm of tongue: Secondary | ICD-10-CM

## 2020-11-28 DIAGNOSIS — Z87891 Personal history of nicotine dependence: Secondary | ICD-10-CM

## 2020-11-28 DIAGNOSIS — D509 Iron deficiency anemia, unspecified: Secondary | ICD-10-CM | POA: Diagnosis present

## 2020-11-28 DIAGNOSIS — J189 Pneumonia, unspecified organism: Principal | ICD-10-CM | POA: Diagnosis present

## 2020-11-28 DIAGNOSIS — R17 Unspecified jaundice: Secondary | ICD-10-CM | POA: Diagnosis present

## 2020-11-28 DIAGNOSIS — T380X5A Adverse effect of glucocorticoids and synthetic analogues, initial encounter: Secondary | ICD-10-CM | POA: Diagnosis present

## 2020-11-28 DIAGNOSIS — L0390 Cellulitis, unspecified: Secondary | ICD-10-CM | POA: Diagnosis present

## 2020-11-28 DIAGNOSIS — D469 Myelodysplastic syndrome, unspecified: Secondary | ICD-10-CM | POA: Diagnosis present

## 2020-11-28 DIAGNOSIS — Z9981 Dependence on supplemental oxygen: Secondary | ICD-10-CM

## 2020-11-28 DIAGNOSIS — I2510 Atherosclerotic heart disease of native coronary artery without angina pectoris: Secondary | ICD-10-CM | POA: Diagnosis present

## 2020-11-28 DIAGNOSIS — E785 Hyperlipidemia, unspecified: Secondary | ICD-10-CM | POA: Diagnosis present

## 2020-11-28 DIAGNOSIS — Z833 Family history of diabetes mellitus: Secondary | ICD-10-CM

## 2020-11-28 DIAGNOSIS — J441 Chronic obstructive pulmonary disease with (acute) exacerbation: Secondary | ICD-10-CM

## 2020-11-28 DIAGNOSIS — E873 Alkalosis: Secondary | ICD-10-CM | POA: Diagnosis present

## 2020-11-28 DIAGNOSIS — D61818 Other pancytopenia: Secondary | ICD-10-CM | POA: Diagnosis present

## 2020-11-28 LAB — COMPREHENSIVE METABOLIC PANEL
ALT: 27 U/L (ref 0–44)
AST: 27 U/L (ref 15–41)
Albumin: 2.5 g/dL — ABNORMAL LOW (ref 3.5–5.0)
Alkaline Phosphatase: 89 U/L (ref 38–126)
Anion gap: 8 (ref 5–15)
BUN: 24 mg/dL — ABNORMAL HIGH (ref 8–23)
CO2: 23 mmol/L (ref 22–32)
Calcium: 8.4 mg/dL — ABNORMAL LOW (ref 8.9–10.3)
Chloride: 103 mmol/L (ref 98–111)
Creatinine, Ser: 1.05 mg/dL (ref 0.61–1.24)
GFR, Estimated: >60 mL/min (ref >60)
Glucose, Bld: 212 mg/dL — ABNORMAL HIGH (ref 70–99)
Potassium: 4.1 mmol/L (ref 3.5–5.1)
Sodium: 134 mmol/L — ABNORMAL LOW (ref 135–145)
Total Bilirubin: 1.8 mg/dL — ABNORMAL HIGH (ref 0.3–1.2)
Total Protein: 7.2 g/dL (ref 6.5–8.1)

## 2020-11-28 LAB — BLOOD GAS, VENOUS
Acid-Base Excess: 1.5 mmol/L (ref 0.0–2.0)
Bicarbonate: 25.1 mmol/L (ref 20.0–28.0)
O2 Saturation: 45.6 %
Patient temperature: 98.6
pCO2, Ven: 37.7 mmHg — ABNORMAL LOW (ref 44.0–60.0)
pH, Ven: 7.438 — ABNORMAL HIGH (ref 7.250–7.430)
pO2, Ven: <31 mmHg — CL (ref 32.0–45.0)

## 2020-11-28 LAB — CBC WITH DIFFERENTIAL/PLATELET
Abs Immature Granulocytes: 0.77 10*3/uL — ABNORMAL HIGH (ref 0.00–0.07)
Basophils Absolute: 0.1 10*3/uL (ref 0.0–0.1)
Basophils Relative: 2 %
Eosinophils Absolute: 0 10*3/uL (ref 0.0–0.5)
Eosinophils Relative: 0 %
HCT: 30.9 % — ABNORMAL LOW (ref 39.0–52.0)
Hemoglobin: 9.6 g/dL — ABNORMAL LOW (ref 13.0–17.0)
Immature Granulocytes: 13 %
Lymphocytes Relative: 17 %
Lymphs Abs: 1 10*3/uL (ref 0.7–4.0)
MCH: 25.3 pg — ABNORMAL LOW (ref 26.0–34.0)
MCHC: 31.1 g/dL (ref 30.0–36.0)
MCV: 81.3 fL (ref 80.0–100.0)
Monocytes Absolute: 0.7 10*3/uL (ref 0.1–1.0)
Monocytes Relative: 11 %
Neutro Abs: 3.4 10*3/uL (ref 1.7–7.7)
Neutrophils Relative %: 57 %
Platelets: 82 10*3/uL — ABNORMAL LOW (ref 150–400)
RBC: 3.8 MIL/uL — ABNORMAL LOW (ref 4.22–5.81)
RDW: 28.3 % — ABNORMAL HIGH (ref 11.5–15.5)
WBC: 6.1 10*3/uL (ref 4.0–10.5)
nRBC: 34.2 % — ABNORMAL HIGH (ref 0.0–0.2)

## 2020-11-28 LAB — I-STAT CHEM 8, ED
BUN: 23 mg/dL (ref 8–23)
Calcium, Ion: 1.2 mmol/L (ref 1.15–1.40)
Chloride: 102 mmol/L (ref 98–111)
Creatinine, Ser: 0.9 mg/dL (ref 0.61–1.24)
Glucose, Bld: 203 mg/dL — ABNORMAL HIGH (ref 70–99)
HCT: 27 % — ABNORMAL LOW (ref 39.0–52.0)
Hemoglobin: 9.2 g/dL — ABNORMAL LOW (ref 13.0–17.0)
Potassium: 4.2 mmol/L (ref 3.5–5.1)
Sodium: 136 mmol/L (ref 135–145)
TCO2: 23 mmol/L (ref 22–32)

## 2020-11-28 LAB — BRAIN NATRIURETIC PEPTIDE: B Natriuretic Peptide: 913.3 pg/mL — ABNORMAL HIGH (ref 0.0–100.0)

## 2020-11-28 LAB — TROPONIN I (HIGH SENSITIVITY): Troponin I (High Sensitivity): 11 ng/L (ref <18)

## 2020-11-28 MED ORDER — SODIUM CHLORIDE 0.9 % IV SOLN: 1.0000 g | Freq: Once | INTRAVENOUS | Status: DC

## 2020-11-28 MED ORDER — IPRATROPIUM BROMIDE HFA 17 MCG/ACT IN AERS
2.0000 | INHALATION_SPRAY | Freq: Once | RESPIRATORY_TRACT | Status: AC
  Administered 2020-11-28: 2 via RESPIRATORY_TRACT
  Filled 2020-11-28: qty 12.9

## 2020-11-28 MED ORDER — IOHEXOL 350 MG/ML SOLN
100.0000 mL | Freq: Once | INTRAVENOUS | Status: AC | PRN
  Administered 2020-11-28: 100 mL via INTRAVENOUS

## 2020-11-28 MED ORDER — SODIUM CHLORIDE 0.9 % IV SOLN: 500.0000 mg | Freq: Once | INTRAVENOUS | Status: DC

## 2020-11-28 MED ORDER — DEXAMETHASONE SODIUM PHOSPHATE 10 MG/ML IJ SOLN
10.0000 mg | Freq: Once | INTRAMUSCULAR | Status: AC
  Administered 2020-11-28: 10 mg via INTRAVENOUS
  Filled 2020-11-28: qty 1

## 2020-11-28 MED ORDER — ALBUTEROL SULFATE HFA 108 (90 BASE) MCG/ACT IN AERS
2.0000 | INHALATION_SPRAY | Freq: Once | RESPIRATORY_TRACT | Status: AC
  Administered 2020-11-28: 2 via RESPIRATORY_TRACT
  Filled 2020-11-28: qty 6.7

## 2020-11-28 NOTE — ED Provider Notes (Signed)
[facility] DEPT Provider Note   CSN: [id number] Arrival date & time: 11/28/20  2056     History Chief Complaint  Patient presents with  . Shortness of Breath    Geoffrey Wood is a 75 y.o. male hx of DM, COPD, interstitial pulmonary fibrosis, hypertension, here presenting with shortness of breath.  Patient states that he has been short of breath for the last week or so.  He is on 4 L nasal cannula at baseline.  Patient apparently desatted to the 64s to 77s today.  Gave himself some nebulizer with temporarily relief and called his doctor who sent him in for rule out PE.  Patient is currently on blood thinners  The history is provided by the patient.       Past Medical History:  Diagnosis Date  . Adenomatous colon polyp   . Cataract   . Diabetes mellitus   . Emphysema   . Emphysema of lung ([place])   . Erectile dysfunction   . Hyperlipidemia   . Hypertension   . Peripheral vascular disease ([place])   . Pneumonia   . Pulmonary nodule     Patient Active Problem List   Diagnosis Date Noted  . Anemia 11/12/2020  . Iron deficiency anemia 11/12/2020  . IPF (idiopathic pulmonary fibrosis) ([place]) 09/30/2020  . 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 Coronary artery calcification 11/16/2018  . Pulmonary nodules 01/22/2014  . Personal history of adenomatous colonic polyps 12/24/2011  . HTN (hypertension) 04/21/2011  . Hyperlipidemia 04/21/2011    Past Surgical History:  Procedure Laterality Date  . ABDOMINAL AORTIC ANEURYSM REPAIR  2004  . COLONOSCOPY  multiple since 2004       Family History  Problem Relation Age of Onset  . Heart failure Mother   . Diabetes Mother   . Cancer Father        lung  . Colon cancer Neg Hx   . Esophageal cancer Neg Hx   . Liver cancer Neg Hx   . Pancreatic cancer Neg Hx   . Rectal cancer Neg Hx   . Stomach cancer Neg Hx     Social History    Tobacco Use  . Smoking status: Former Smoker    Packs/day: 1.50    Years: 60.00    Pack years: 90.00    Types: Cigarettes    Quit date: 09/17/2014    Years since quitting: 6.2  . Smokeless tobacco: Never Used  Vaping Use  . Vaping Use: Never used  Substance Use Topics  . Alcohol use: Yes    Comment: not even 1 drink a week  . Drug use: No    Home Medications Prior to Admission medications   Medication Sig Start Date End Date Taking? Authorizing Provider  acetaminophen (TYLENOL) 325 MG tablet Take 325 mg by mouth every 6 (six) hours as needed for mild pain.    [provider]  Celedonio Miyamoto 62.5-25 MCG/INH AEPB Take 1 puff by mouth daily. 08/20/17   [provider]  Ascorbic Acid (VITAMIN C) 500 MG CAPS Take 1 capsule by mouth 2 (two) times daily.    [provider]  Cholecalciferol (VITAMIN D PO) Take 800 Units by mouth daily.    [provider]  Cyanocobalamin (VITAMIN B 12 PO) Take 500 mcg by mouth daily.    [provider]  ERGOCALCIFEROL PO Take 800 Units by mouth daily.    [provider]  iron polysaccharides (NIFEREX) 150 MG capsule Take 1 capsule (150 mg total) by mouth daily. 08/07/20   Brunetta Genera, MD  Melatonin 10 MG TABS Take 10 mg by mouth at bedtime as needed (sleep).    [provider]  mirtazapine (REMERON) 15 MG tablet Take 45 mg by mouth at bedtime. 09/03/20   [provider]  Roma Schanz test strip  07/12/18   [provider]    Allergies    Patient has no known allergies.  Review of Systems   Review of Systems  Respiratory: Positive for shortness of breath.   All other systems reviewed and are negative.   Physical Exam Updated Vital Signs BP (!) 98/55   Pulse 76   Temp 98.2 F (36.8 C) (Oral)   Resp (!) 32   SpO2 91%   Physical Exam Vitals and nursing note reviewed.  Constitutional:      Comments: Tachypneic, chronically ill  HENT:     Head:  Normocephalic.  Eyes:     Extraocular Movements: Extraocular movements intact.     Pupils: Pupils are equal, round, and reactive to light.  Pulmonary:     Comments: Crackles bilateral bases, mild wheezing throughout  Abdominal:     General: Bowel sounds are normal.     Palpations: Abdomen is soft.  Musculoskeletal:        General: Normal range of motion.     Cervical back: Normal range of motion.     Comments: Trace edema bilaterally   Skin:    General: Skin is warm.     Capillary Refill: Capillary refill takes less than 2 seconds.  Neurological:     General: No focal deficit present.  Psychiatric:        Mood and Affect: Mood normal.        Behavior: Behavior normal.     ED Results / Procedures / Treatments   Labs (all labs ordered are listed, but only abnormal results are displayed) Labs Reviewed  CBC WITH DIFFERENTIAL/PLATELET - Abnormal; Notable for the following components:      Result Value   RBC 3.80 (*)    Hemoglobin 9.6 (*)    HCT 30.9 (*)    MCH 25.3 (*)    RDW 28.3 (*)    Platelets 82 (*)    nRBC 34.2 (*)    Abs Immature Granulocytes 0.77 (*)    All other components within normal limits  COMPREHENSIVE METABOLIC PANEL - Abnormal; Notable for the following components:   Sodium 134 (*)    Glucose, Bld 212 (*)    BUN 24 (*)    Calcium 8.4 (*)    Albumin 2.5 (*)    Total Bilirubin 1.8 (*)    All other components within normal limits  BLOOD GAS, VENOUS - Abnormal; Notable for the following components:   pH, Ven 7.438 (*)    pCO2, Ven 37.7 (*)    pO2, Ven <31.0 (*)    All other components within normal limits  I-STAT CHEM 8, ED - Abnormal; Notable for the following components:   Glucose, Bld 203 (*)    Hemoglobin 9.2 (*)    HCT 27.0 (*)    All other components within normal limits  RESP PANEL BY RT-PCR (FLU A&B, COVID) ARPGX2  BRAIN NATRIURETIC PEPTIDE  TROPONIN I (HIGH SENSITIVITY)    EKG EKG Interpretation  Date/Time:  Thursday November 28 2020  21:21:12 EDT Ventricular Rate:  82 PR Interval:  166 QRS  Duration: 114 QT Interval:  377 QTC Calculation: 441 R Axis:   0 Text Interpretation: Sinus rhythm Incomplete right bundle branch block ST depr, consider ischemia, anterior leads TWI new since previous Confirmed by Wandra Arthurs [phone]) on 11/28/2020 10:41:04 PM   Radiology CT Angio Chest PE W and/or Wo Contrast  Result Date: 11/28/2020 CLINICAL DATA:  Dyspnea EXAM: CT ANGIOGRAPHY CHEST WITH CONTRAST TECHNIQUE: Multidetector CT imaging of the chest was performed using the standard protocol during bolus administration of intravenous contrast. Multiplanar CT image reconstructions and MIPs were obtained to evaluate the vascular anatomy. CONTRAST:  14mL OMNIPAQUE IOHEXOL 350 MG/ML SOLN COMPARISON:  09/26/2020 FINDINGS: Cardiovascular: There is adequate opacification of the pulmonary arterial tree. There is no intraluminal filling defect identified to suggest acute pulmonary embolism. The central pulmonary arteries are of normal caliber. Mild atherosclerotic calcification within the thoracic aorta. The thoracic aorta is dilated, measuring 4.2 x 3.9 cm in its ascending segment (coronal # 55/sagittal # 85) and 3.8 x 3.4 cm just beyond the takeoff of the left subclavian vein within the aortic arch (axial # 44/sagittal # 96). At the level of the diaphragmatic hiatus, the descending thoracic aorta measures 3.2 cm. Extensive multi-vessel coronary artery calcification. Mild global cardiomegaly with particular enlargement of the a right ventricle. No pericardial effusion. Mediastinum/Nodes: There has developed extensive bilateral hilar and mediastinal adenopathy, possibly reactive in nature given its relatively rapid development since prior examination. Index right hilar lymph node measures 2.3 cm at axial image # 70 and left hilar lymph node measures 1.5 cm at axial image # 74. The visualized thyroid is unremarkable. The esophagus is unremarkable.  Lungs/Pleura: Severe centrilobular and paraseptal emphysema, more severe within the apices bilaterally. Since the prior examination, there has developed extensive bibasilar ground-glass pulmonary infiltrate and pulmonary consolidation, more severe within the right lower lobe. Small right pleural effusion has developed. Together, the findings are suspicious for atypical infection, aspiration, or less commonly, drug reaction. Small probable associated right parapneumonic effusion. No pneumothorax. The central airways are widely patent. Upper Abdomen: No acute abnormality. Musculoskeletal: No acute bone abnormality. No suspicious lytic or blastic bone lesion. Review of the MIP images confirms the above findings. IMPRESSION: No pulmonary embolism. Severe centrilobular emphysema. Interval development of extensive bibasilar pulmonary infiltrate and consolidation most in keeping with atypical infection in the acute setting with small associated right parapneumonic effusion. Less commonly, this may be seen with aspiration or drug reaction. Extensive mediastinal adenopathy may be reactive in nature given its relatively rapid development. Follow-up imaging in 3 months, once the patient acute issues have resolved, would be helpful in documenting resolution. Extensive multi-vessel coronary artery calcification. Mild cardiomegaly with particular enlargement of the right ventricle. Dilation of the thoracic aorta with maximal dimension of 4.2 cm within the ascending segment. Recommend annual imaging followup by CTA or MRA. This recommendation follows 2010 ACCF/AHA/AATS/ACR/ASA/SCA/SCAI/SIR/STS/SVM Guidelines for the Diagnosis and Management of Patients with Thoracic Aortic Disease. Circulation. 2010; 121: K938-H829. Aortic aneurysm NOS (ICD10-I71.9) Aortic aneurysm NOS (ICD10-I71.9). Aortic Atherosclerosis (ICD10-I70.0) and Emphysema (ICD10-J43.9). Electronically Signed   By: Fidela Salisbury MD   On: 11/28/2020 23:19   DG Chest  Port 1 View  Result Date: 11/28/2020 CLINICAL DATA:  Chronic oxygen dependent patient presenting with a 1 week history of progressively worsening shortness of breath and hypoxemia (oxygen saturation in the 70s and 80s). Current history of UIP/pulmonary fibrosis. EXAM: PORTABLE CHEST 1 VIEW COMPARISON:  Chest x-ray 07/15/2020. High-resolution CT chest 09/26/2020. FINDINGS: Cardiac silhouette upper normal  in size for AP portable technique. Interstitial opacities throughout both lungs which are not felt to be significantly changed since the high-resolution CT. More confluent opacity peripherally in the mid RIGHT lung was shown on the CT to represent scarring. IMPRESSION: Chronic interstitial lung disease/pulmonary fibrosis. No convincing evidence of acute cardiopulmonary disease. Electronically Signed   By: Evangeline Dakin M.D.   On: 11/28/2020 21:55    Procedures Procedures   Medications Ordered in ED Medications  dexamethasone (DECADRON) injection 10 mg (10 mg Intravenous Given 11/28/20 2158)  albuterol (VENTOLIN HFA) 108 (90 Base) MCG/ACT inhaler 2 puff (2 puffs Inhalation Given 11/28/20 2158)  ipratropium (ATROVENT HFA) inhaler 2 puff (2 puffs Inhalation Given 11/28/20 2158)  iohexol (OMNIPAQUE) 350 MG/ML injection 100 mL (100 mLs Intravenous Contrast Given 11/28/20 2246)    ED Course  I have reviewed the triage vital signs and the nursing notes.  Pertinent labs & imaging results that were available during my care of the patient were reviewed by me and considered in my medical decision making (see chart for details).    MDM Rules/Calculators/A&P                         DERRIC DEALMEIDA is a 75 y.o. male here with shortness of breath.  Shortness of breath likely multifactorial.  Likely from worsening COPD versus pulmonary fibrosis versus PE .  Patient also has T wave inversion and ST depressions are new.  Plan to get CBC, CMP, troponin, BNP, CTA chest.  11:23 PM CTA pending.  Patient's ABG  showed mild respiratory alkalosis.  Anticipate admission for COPD exacerbation.  At this point Covid test is pending and CTA is pending.  Signed out to Dr. Karle Starch in the ED.    Final Clinical Impression(s) / ED Diagnoses Final diagnoses:  None    Rx / DC Orders ED Discharge Orders    None       Drenda Freeze, MD 11/28/20 2324

## 2020-11-28 NOTE — ED Triage Notes (Signed)
Patient presents with shortness of breath for one week that has gotten progressively worse. He is chronically on 4L of O2. Today EMS noted his stats were in the 70's- 80's. There was momentary improvement with a nebulizer treatment. His MD want him to come due to concerns of a PE.   EMS vitals: 130/60 BP 90 HR 18 RR 271 CBG 92% SPO2 on 4L nasal canula

## 2020-11-28 NOTE — Progress Notes (Signed)
From [place]: I was on call provider and to just pass on information. ####This is not a plan. #####Patient is a 75 year old male with a history of severe COPD and pulmonary fibrosis who was seen Dr. 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 The patient states that about a month ago he started really going downhill, already having months of weight loss and not doing very well, and sleeping a lot during the day and being sedentary. 9.9 hemoglobin last platelets at the end of March were 82, creatinine 0.94 and iron numbers were okay. I was paged that the oxygen numbers were 61% after receiving and starting oxygen today, the patient and wife are having difficulties getting the oxygen saturation going but after coughing could not get the numbers up to 88% on 4L of oxygen. Improved a fair amount with coughing. He is adherent/persistent with his Anoro but does not have a rescue inhaler. I encouraged him to call 911 immediately and I talk to the EMT provider to pass on information. I described that I was very concerned about a blood clot, or COPD/pulmonary fibrosis exacerbation. I think 2 weeks ago he underwent 2 rounds of antibiotics and maybe did not get much better. It is odd, is that he did not really seem in distress but I did note some tachypnea and increased work of breathing while talking to him. CT scan at the beginning of February showed progressive pulmonary fibrosis and like I said before saw pulmonology. For the anemia and thrombocytopenia, he is been seeing hematology, Dr. Irene Limbo. I do wonder if it might take time to rerecruit his lungs to get oxygen up or are his hands cold and family can't get good pulse ox?  -CTA PE ? -prednisone/for pulm fibrosis flare? -sounds gunky-rhonchorous cough, mucus  plug? -since has been going on for a month asked emt to certainly bring into Ed for evaluation  -pt and wife said they have more living to do for him even though his life has been below par but hoping to get over parotid component , but seemed open to intubation if indicated, I asked them to think about it.

## 2020-11-28 NOTE — ED Provider Notes (Signed)
Care of the patient assumed at the change of shift. COPD, ILD, here with increased SOB and increased O2 requirement. Awaiting CTA.  Physical Exam  BP (!) 98/55   Pulse 76   Temp 98.2 F (36.8 C) (Oral)   Resp (!) 32   SpO2 91%   Physical Exam  ED Course/Procedures     Procedures  MDM  CTA neg for PE, shows infiltrate and consolidation concerning for PNA. Abx ordered, will discuss with Hospitalist for admission.        Truddie Hidden, MD 11/29/20 0000

## 2020-11-29 ENCOUNTER — Inpatient Hospital Stay (HOSPITAL_COMMUNITY): Payer: Medicare Other

## 2020-11-29 DIAGNOSIS — R531 Weakness: Secondary | ICD-10-CM | POA: Diagnosis not present

## 2020-11-29 DIAGNOSIS — I10 Essential (primary) hypertension: Secondary | ICD-10-CM | POA: Diagnosis present

## 2020-11-29 DIAGNOSIS — J189 Pneumonia, unspecified organism: Secondary | ICD-10-CM | POA: Diagnosis present

## 2020-11-29 DIAGNOSIS — E43 Unspecified severe protein-calorie malnutrition: Secondary | ICD-10-CM | POA: Diagnosis present

## 2020-11-29 DIAGNOSIS — R0602 Shortness of breath: Secondary | ICD-10-CM

## 2020-11-29 DIAGNOSIS — Z87891 Personal history of nicotine dependence: Secondary | ICD-10-CM | POA: Diagnosis not present

## 2020-11-29 DIAGNOSIS — J432 Centrilobular emphysema: Secondary | ICD-10-CM | POA: Diagnosis not present

## 2020-11-29 DIAGNOSIS — D61818 Other pancytopenia: Secondary | ICD-10-CM | POA: Diagnosis present

## 2020-11-29 DIAGNOSIS — I2510 Atherosclerotic heart disease of native coronary artery without angina pectoris: Secondary | ICD-10-CM

## 2020-11-29 DIAGNOSIS — L0390 Cellulitis, unspecified: Secondary | ICD-10-CM | POA: Diagnosis present

## 2020-11-29 DIAGNOSIS — H9193 Unspecified hearing loss, bilateral: Secondary | ICD-10-CM | POA: Diagnosis present

## 2020-11-29 DIAGNOSIS — Z85818 Personal history of malignant neoplasm of other sites of lip, oral cavity, and pharynx: Secondary | ICD-10-CM | POA: Diagnosis not present

## 2020-11-29 DIAGNOSIS — E873 Alkalosis: Secondary | ICD-10-CM | POA: Diagnosis present

## 2020-11-29 DIAGNOSIS — R17 Unspecified jaundice: Secondary | ICD-10-CM | POA: Diagnosis present

## 2020-11-29 DIAGNOSIS — J441 Chronic obstructive pulmonary disease with (acute) exacerbation: Secondary | ICD-10-CM | POA: Diagnosis present

## 2020-11-29 DIAGNOSIS — D638 Anemia in other chronic diseases classified elsewhere: Secondary | ICD-10-CM | POA: Diagnosis present

## 2020-11-29 DIAGNOSIS — J918 Pleural effusion in other conditions classified elsewhere: Secondary | ICD-10-CM | POA: Diagnosis present

## 2020-11-29 DIAGNOSIS — J9601 Acute respiratory failure with hypoxia: Secondary | ICD-10-CM | POA: Diagnosis not present

## 2020-11-29 DIAGNOSIS — I361 Nonrheumatic tricuspid (valve) insufficiency: Secondary | ICD-10-CM | POA: Diagnosis not present

## 2020-11-29 DIAGNOSIS — L03211 Cellulitis of face: Secondary | ICD-10-CM

## 2020-11-29 DIAGNOSIS — Z7189 Other specified counseling: Secondary | ICD-10-CM | POA: Diagnosis not present

## 2020-11-29 DIAGNOSIS — D693 Immune thrombocytopenic purpura: Secondary | ICD-10-CM

## 2020-11-29 DIAGNOSIS — E876 Hypokalemia: Secondary | ICD-10-CM | POA: Diagnosis present

## 2020-11-29 DIAGNOSIS — R040 Epistaxis: Secondary | ICD-10-CM | POA: Diagnosis present

## 2020-11-29 DIAGNOSIS — I2781 Cor pulmonale (chronic): Secondary | ICD-10-CM | POA: Diagnosis present

## 2020-11-29 DIAGNOSIS — E782 Mixed hyperlipidemia: Secondary | ICD-10-CM

## 2020-11-29 DIAGNOSIS — J439 Emphysema, unspecified: Secondary | ICD-10-CM | POA: Diagnosis present

## 2020-11-29 DIAGNOSIS — E785 Hyperlipidemia, unspecified: Secondary | ICD-10-CM | POA: Diagnosis present

## 2020-11-29 DIAGNOSIS — J84112 Idiopathic pulmonary fibrosis: Secondary | ICD-10-CM | POA: Diagnosis present

## 2020-11-29 DIAGNOSIS — J849 Interstitial pulmonary disease, unspecified: Secondary | ICD-10-CM | POA: Diagnosis not present

## 2020-11-29 DIAGNOSIS — Z515 Encounter for palliative care: Secondary | ICD-10-CM | POA: Diagnosis not present

## 2020-11-29 DIAGNOSIS — I2584 Coronary atherosclerosis due to calcified coronary lesion: Secondary | ICD-10-CM

## 2020-11-29 DIAGNOSIS — E119 Type 2 diabetes mellitus without complications: Secondary | ICD-10-CM | POA: Diagnosis not present

## 2020-11-29 DIAGNOSIS — J9621 Acute and chronic respiratory failure with hypoxia: Secondary | ICD-10-CM | POA: Diagnosis present

## 2020-11-29 DIAGNOSIS — Z20822 Contact with and (suspected) exposure to covid-19: Secondary | ICD-10-CM | POA: Diagnosis present

## 2020-11-29 DIAGNOSIS — C07 Malignant neoplasm of parotid gland: Secondary | ICD-10-CM | POA: Diagnosis present

## 2020-11-29 DIAGNOSIS — E1165 Type 2 diabetes mellitus with hyperglycemia: Secondary | ICD-10-CM | POA: Diagnosis present

## 2020-11-29 DIAGNOSIS — T380X5A Adverse effect of glucocorticoids and synthetic analogues, initial encounter: Secondary | ICD-10-CM | POA: Diagnosis present

## 2020-11-29 DIAGNOSIS — R1310 Dysphagia, unspecified: Secondary | ICD-10-CM | POA: Diagnosis present

## 2020-11-29 LAB — CBC WITH DIFFERENTIAL/PLATELET
Abs Immature Granulocytes: 0.83 10*3/uL — ABNORMAL HIGH (ref 0.00–0.07)
Basophils Absolute: 0.1 10*3/uL (ref 0.0–0.1)
Basophils Relative: 2 %
Eosinophils Absolute: 0 10*3/uL (ref 0.0–0.5)
Eosinophils Relative: 0 %
HCT: 29.1 % — ABNORMAL LOW (ref 39.0–52.0)
Hemoglobin: 9 g/dL — ABNORMAL LOW (ref 13.0–17.0)
Immature Granulocytes: 13 %
Lymphocytes Relative: 15 %
Lymphs Abs: 1 10*3/uL (ref 0.7–4.0)
MCH: 25.1 pg — ABNORMAL LOW (ref 26.0–34.0)
MCHC: 30.9 g/dL (ref 30.0–36.0)
MCV: 81.3 fL (ref 80.0–100.0)
Monocytes Absolute: 0.6 10*3/uL (ref 0.1–1.0)
Monocytes Relative: 9 %
Neutro Abs: 3.9 10*3/uL (ref 1.7–7.7)
Neutrophils Relative %: 61 %
Platelets: 78 10*3/uL — ABNORMAL LOW (ref 150–400)
RBC: 3.58 MIL/uL — ABNORMAL LOW (ref 4.22–5.81)
RDW: 28.2 % — ABNORMAL HIGH (ref 11.5–15.5)
WBC: 6.3 10*3/uL (ref 4.0–10.5)
nRBC: 27.3 % — ABNORMAL HIGH (ref 0.0–0.2)

## 2020-11-29 LAB — URINALYSIS, ROUTINE W REFLEX MICROSCOPIC
Bilirubin Urine: NEGATIVE
Glucose, UA: NEGATIVE mg/dL
Hgb urine dipstick: NEGATIVE
Ketones, ur: NEGATIVE mg/dL
Leukocytes,Ua: NEGATIVE
Nitrite: NEGATIVE
Protein, ur: NEGATIVE mg/dL
Specific Gravity, Urine: >1.046 — ABNORMAL HIGH (ref 1.005–1.030)
pH: 6 (ref 5.0–8.0)

## 2020-11-29 LAB — ECHOCARDIOGRAM COMPLETE
AR max vel: 2.58 cm2
AV Area VTI: 2.51 cm2
AV Area mean vel: 2.81 cm2
AV Mean grad: 6 mmHg
AV Peak grad: 11.8 mmHg
Ao pk vel: 1.72 m/s
Area-P 1/2: 2.5 cm2
S' Lateral: 2.88 cm
Single Plane A2C EF: 66.2 %

## 2020-11-29 LAB — RESP PANEL BY RT-PCR (FLU A&B, COVID) ARPGX2
Influenza A by PCR: NEGATIVE
Influenza B by PCR: NEGATIVE
SARS Coronavirus 2 by RT PCR: NEGATIVE

## 2020-11-29 LAB — GLUCOSE, CAPILLARY
Glucose-Capillary: 149 mg/dL — ABNORMAL HIGH (ref 70–99)
Glucose-Capillary: 192 mg/dL — ABNORMAL HIGH (ref 70–99)
Glucose-Capillary: 214 mg/dL — ABNORMAL HIGH (ref 70–99)
Glucose-Capillary: 247 mg/dL — ABNORMAL HIGH (ref 70–99)
Glucose-Capillary: 261 mg/dL — ABNORMAL HIGH (ref 70–99)
Glucose-Capillary: 291 mg/dL — ABNORMAL HIGH (ref 70–99)

## 2020-11-29 LAB — COMPREHENSIVE METABOLIC PANEL
ALT: 25 U/L (ref 0–44)
AST: 24 U/L (ref 15–41)
Albumin: 2.5 g/dL — ABNORMAL LOW (ref 3.5–5.0)
Alkaline Phosphatase: 84 U/L (ref 38–126)
Anion gap: 8 (ref 5–15)
BUN: 21 mg/dL (ref 8–23)
CO2: 20 mmol/L — ABNORMAL LOW (ref 22–32)
Calcium: 8.2 mg/dL — ABNORMAL LOW (ref 8.9–10.3)
Chloride: 103 mmol/L (ref 98–111)
Creatinine, Ser: 0.9 mg/dL (ref 0.61–1.24)
GFR, Estimated: >60 mL/min (ref >60)
Glucose, Bld: 197 mg/dL — ABNORMAL HIGH (ref 70–99)
Potassium: 4.1 mmol/L (ref 3.5–5.1)
Sodium: 131 mmol/L — ABNORMAL LOW (ref 135–145)
Total Bilirubin: 1.8 mg/dL — ABNORMAL HIGH (ref 0.3–1.2)
Total Protein: 7.1 g/dL (ref 6.5–8.1)

## 2020-11-29 LAB — HEMOGLOBIN A1C
Hgb A1c MFr Bld: 5.5 % (ref 4.8–5.6)
Mean Plasma Glucose: 111.15 mg/dL

## 2020-11-29 LAB — STREP PNEUMONIAE URINARY ANTIGEN: Strep Pneumo Urinary Antigen: NEGATIVE

## 2020-11-29 LAB — LIPID PANEL
Cholesterol: 87 mg/dL (ref 0–200)
HDL: 18 mg/dL — ABNORMAL LOW (ref >40)
LDL Cholesterol: 59 mg/dL (ref 0–99)
Total CHOL/HDL Ratio: 4.8 RATIO
Triglycerides: 52 mg/dL (ref <150)
VLDL: 10 mg/dL (ref 0–40)

## 2020-11-29 LAB — LACTIC ACID, PLASMA: Lactic Acid, Venous: 0.9 mmol/L (ref 0.5–1.9)

## 2020-11-29 LAB — PREALBUMIN: Prealbumin: 6.7 mg/dL — ABNORMAL LOW (ref 18–38)

## 2020-11-29 LAB — PROCALCITONIN
Procalcitonin: 0.14 ng/mL
Procalcitonin: 0.14 ng/mL

## 2020-11-29 LAB — MAGNESIUM: Magnesium: 2.1 mg/dL (ref 1.7–2.4)

## 2020-11-29 LAB — TROPONIN I (HIGH SENSITIVITY)
Troponin I (High Sensitivity): 22 ng/L — ABNORMAL HIGH (ref <18)
Troponin I (High Sensitivity): 10 ng/L (ref <18)

## 2020-11-29 MED ORDER — SODIUM CHLORIDE 0.9 % IV SOLN
500.0000 mg | INTRAVENOUS | Status: AC
  Administered 2020-11-29 – 2020-12-03 (×5): 500 mg via INTRAVENOUS
  Filled 2020-11-29 (×5): qty 500

## 2020-11-29 MED ORDER — PREDNISONE 20 MG PO TABS: 40.0000 mg | ORAL_TABLET | Freq: Every day | ORAL | Status: DC

## 2020-11-29 MED ORDER — METHYLPREDNISOLONE SODIUM SUCC 40 MG IJ SOLR: 40.0000 mg | Freq: Two times a day (BID) | INTRAMUSCULAR | Status: DC

## 2020-11-29 MED ORDER — SODIUM CHLORIDE 0.9 % IV SOLN: INTRAVENOUS | Status: DC

## 2020-11-29 MED ORDER — IPRATROPIUM-ALBUTEROL 0.5-2.5 (3) MG/3ML IN SOLN
3.0000 mL | Freq: Three times a day (TID) | RESPIRATORY_TRACT | Status: DC
  Administered 2020-11-29 – 2020-12-04 (×17): 3 mL via RESPIRATORY_TRACT
  Filled 2020-11-29 (×17): qty 3

## 2020-11-29 MED ORDER — INSULIN ASPART 100 UNIT/ML ~~LOC~~ SOLN
0.0000 [IU] | SUBCUTANEOUS | Status: DC
  Administered 2020-11-29: 5 [IU] via SUBCUTANEOUS
  Administered 2020-11-29 (×2): 3 [IU] via SUBCUTANEOUS
  Administered 2020-11-29: 5 [IU] via SUBCUTANEOUS
  Administered 2020-11-29: 2 [IU] via SUBCUTANEOUS
  Administered 2020-11-30: 3 [IU] via SUBCUTANEOUS
  Administered 2020-11-30 (×2): 2 [IU] via SUBCUTANEOUS
  Administered 2020-11-30: 3 [IU] via SUBCUTANEOUS
  Administered 2020-11-30: 1 [IU] via SUBCUTANEOUS
  Administered 2020-11-30: 2 [IU] via SUBCUTANEOUS
  Administered 2020-12-01: 3 [IU] via SUBCUTANEOUS
  Administered 2020-12-01: 5 [IU] via SUBCUTANEOUS
  Administered 2020-12-01: 7 [IU] via SUBCUTANEOUS
  Filled 2020-11-29: qty 0.09

## 2020-11-29 MED ORDER — ALBUTEROL SULFATE (2.5 MG/3ML) 0.083% IN NEBU: 2.5000 mg | INHALATION_SOLUTION | RESPIRATORY_TRACT | Status: DC | PRN

## 2020-11-29 MED ORDER — SODIUM CHLORIDE 0.9% FLUSH
3.0000 mL | Freq: Two times a day (BID) | INTRAVENOUS | Status: DC
  Administered 2020-11-29 – 2020-12-04 (×10): 3 mL via INTRAVENOUS

## 2020-11-29 MED ORDER — IPRATROPIUM-ALBUTEROL 0.5-2.5 (3) MG/3ML IN SOLN
3.0000 mL | Freq: Four times a day (QID) | RESPIRATORY_TRACT | Status: DC
  Administered 2020-11-29: 3 mL via RESPIRATORY_TRACT
  Filled 2020-11-29: qty 3

## 2020-11-29 MED ORDER — KATE FARMS STANDARD 1.4 PO LIQD
325.0000 mL | Freq: Three times a day (TID) | ORAL | Status: DC
  Administered 2020-11-29 – 2020-12-04 (×12): 325 mL via ORAL
  Filled 2020-11-29 (×17): qty 325

## 2020-11-29 MED ORDER — ADULT MULTIVITAMIN W/MINERALS CH
1.0000 | ORAL_TABLET | Freq: Every day | ORAL | Status: DC
  Administered 2020-11-29 – 2020-12-04 (×6): 1 via ORAL
  Filled 2020-11-29 (×6): qty 1

## 2020-11-29 MED ORDER — SODIUM CHLORIDE 0.9 % IV SOLN
2.0000 g | INTRAVENOUS | Status: AC
  Administered 2020-11-29 – 2020-12-03 (×5): 2 g via INTRAVENOUS
  Filled 2020-11-29: qty 2
  Filled 2020-11-29: qty 20
  Filled 2020-11-29 (×3): qty 2

## 2020-11-29 MED ORDER — RESOURCE INSTANT PROTEIN PO PWD PACKET
1.0000 | Freq: Three times a day (TID) | ORAL | Status: DC
  Administered 2020-11-29 – 2020-12-04 (×14): 6 g via ORAL
  Filled 2020-11-29 (×16): qty 6

## 2020-11-29 MED ORDER — ENSURE ENLIVE PO LIQD: 237.0000 mL | Freq: Two times a day (BID) | ORAL | Status: DC

## 2020-11-29 MED ORDER — VANCOMYCIN HCL 1250 MG/250ML IV SOLN: 1250.0000 mg | INTRAVENOUS | Status: DC

## 2020-11-29 MED ORDER — VANCOMYCIN HCL 1250 MG/250ML IV SOLN
1250.0000 mg | INTRAVENOUS | Status: AC
  Administered 2020-11-29: 1250 mg via INTRAVENOUS
  Filled 2020-11-29: qty 250

## 2020-11-29 NOTE — Progress Notes (Signed)
PROGRESS NOTE  Brief Narrative: Geoffrey Wood is a 75 y.o. male with a history of COPD, IPF recently started on 4L O2 at home, Geoffrey Wood of right parotid s/p excision with chronic wound, ITP, HTN, and hard of hearing who presented to the ED late 4/14 for progressive shortness of breath and hypoxia to 70-80%'s despite starting 4L O2 at home. He was confirmed to be hypoxic, due to mouth breathing, placed on simple mask with improved oxygenation. CXR demonstrated chronic interstial lung disease/fibrosis which appeared stable from prior HRCT. Subsequent CTA chest revealed extensive bibasilar infiltrate/consolidations with small right parapneumonic effusion with extensive mediastinal adenopathy. No PE, severe emphysema. Antibiotics were given and patient admitted this morning by Dr. Roel Wood.   Subjective: Breathing much better this morning, able to get around without hypoxia on 4L O2. No wheezing. Does not feel like the wound on the right face is any different from baseline.   Objective: BP (!) 94/54 (BP Location: Left Arm)   Pulse 61   Temp (!) 97 F (36.1 C)   Resp 20   SpO2 99%   Gen: Thin frail male in no distress Pulm: Bibasilar crackles, nonlabored  CV: RRR, trace dependent edema GI: Soft, NT, ND, +BS  Neuro: Alert and oriented. Right peripheral facial nerve deficit at baseline. No other focal deficits. Skin: Right preauricular wound surrounded by scar tissue and contracted skin with predominantly serous discharge does not appear to track. No erythema, fluctuance, or tenderness.   Assessment & Plan: Active Problems:   HTN (hypertension)   Hyperlipidemia   Coronary artery calcification   Immune thrombocytopenia (HCC)   IPF (idiopathic pulmonary fibrosis) (HCC)   COPD exacerbation (HCC)   CAP (community acquired pneumonia)   Cellulitis  Acute on chronic hypoxic respiratory failure: Due to bibasilar pneumonia +/- COPD exacerbation, pleural effusion on baseline severe COPD, IPF. No PE on  CTA. Covid and flu negative.  - Continue antibiotics for now, can DC vancomycin. PCT reassuring, WBC normal. Would appreciate swallow evaluation given that radiographic findings could represent aspiration. - Monitor blood, sputum cultures, urine Ag's, and broader viral respiratory panel (some preceding URI symptoms) - Holding steroids due to infection and no wheezing on exam currently. - Continue bronchodilators scheduled duoneb and prn albuterol - BNP grossly elevated with cardiomegaly, particularly RV enlargement. Will follow up echo - Repeat CT recommended in 3 months to confirm resolution of infiltrates, effusion, and adenopathy.   IPF, COPD: Followed by pulmonary, Dr. Vaughan Wood. Held off initiation of treatment at Feb visit with plans to follow up in 6 months.  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  - Follow up with ENT as scheduled 4/20 - Has CT soft tissue head/neck scheduled 4/21 with follow up with Dr. Isidore Wood 4/22.  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 This has not changed and he's told it's unlikely to ever close up. Has continued wound care by his wife diligently.  - Has follow up with Dr. Lucia Wood on 4/20. Will defer further management to him. Do not feel this requires targeted treatment for cellulitis at this time.  Moderate protein-calorie malnutrition:  - SLP and dietitian consults ITP, chronic iron-deficiency anemia, AOCD: Follows with Dr. Irene Wood.  - Platelets at baseline, but low enough to preclude pharmacologic VTE ppx. - Hgb near baseline, continue po iron.  Extensive multivessel CAD, PVD, HLD:  Noted on CTA. TWI, ST depressions on ECG. - Troponin reassuring x2, no chest pain. Echocardiogram ordered.   Ascending aortic aneurysm: 4.2cm on CTA. Also s/p AAA repair 2004. - Follow up CTA or MRA  recommended annually.  T2DM:  - SSI  Geoffrey Pour, MD Pager on [facility] 11/29/2020, 11:35 AM

## 2020-11-29 NOTE — H&P (Signed)
Geoffrey Wood:[id number] DOB: [birth year] DOA: 11/28/2020     PCP: Crist Infante, MD   Outpatient Specialists:   CARDS:   Dr. Acie Fredrickson    Oncology  Dr. Vale Haven, Otolaryngology Dr. Lucia Gaskins Patient arrived to ER on 11/28/20 at 2056 Referred by Attending Truddie Hidden, MD   Patient coming from: home Lives With family    Chief Complaint:   Chief Complaint  Patient presents with  . Shortness of Breath    HPI: Geoffrey Wood is a 75 y.o. 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 O2 down to 70-80% today on 4L [place] at baseline.  Tried to use nebulizer, His PCP advised he come to ER to RO PE  Not on blood thinners No fever.  Patient has a wound on the right side next to his ear but unsure how long its been there   Infectious risk factors:  Reports shortness of breath         Initial COVID TEST  NEGATIVE   Lab Results  Component Value Date   [place] 11/28/2020   [place] NEGATIVE 09/27/2020     Regarding pertinent Chronic problems:     Hyperlipidemia -  on statins Lipitor in the past   Thrombocytopenia chronic followed by Dr. Irene Limbo   DM 2 -  diet controlled       COPD -   on baseline oxygen  4L,       Lab Results  Component Value Date   CREATININE 0.90 11/28/2020   CREATININE 1.05 11/28/2020   CREATININE 0.94 11/12/2020      Chronic anemia - baseline hg Hemoglobin & Hematocrit on iron infusions Recent Labs    11/12/20 1342 11/28/20 2149 11/28/20 2204  HGB 9.9* 9.6* 9.2*     While in ER: CTA neg for PE but showed PNA Noted right periauricular infection Started on Rocephin azithromycin for pneumonia and then added vancomycin for wound infection Initially was given a dose of steroids but given bilateral pneumonia as well as  skin infection and no wheezing we will hold off for now   ED Triage Vitals  Enc Vitals Group     BP 11/28/20 2122 121/60     Pulse Rate 11/28/20 2122 83     Resp 11/28/20 2122 (!) 24     Temp 11/28/20 2122 98.2 F (36.8 C)     Temp Source 11/28/20 2122 Oral     SpO2 11/28/20 2122 92 %     Weight --      Height --      Head Circumference --      Peak Flow --      Pain Score 11/28/20 2153 0     Pain Loc --      Pain Edu? --      Excl. in [place]? --   TMAX(24)@     _________________________________________ Significant initial  Findings: Abnormal Labs Reviewed  CBC WITH DIFFERENTIAL/PLATELET - Abnormal; Notable for the following components:      Result Value   RBC 3.80 (*)    Hemoglobin 9.6 (*)    HCT 30.9 (*)    MCH 25.3 (*)    RDW 28.3 (*)    Platelets 82 (*)    nRBC 34.2 (*)  Abs Immature Granulocytes 0.77 (*)    All other components within normal limits  COMPREHENSIVE METABOLIC PANEL - Abnormal; Notable for the following components:   Sodium 134 (*)    Glucose, Bld 212 (*)    BUN 24 (*)    Calcium 8.4 (*)    Albumin 2.5 (*)    Total Bilirubin 1.8 (*)    All other components within normal limits  BLOOD GAS, VENOUS - Abnormal; Notable for the following components:   pH, Ven 7.438 (*)    pCO2, Ven 37.7 (*)    pO2, Ven <31.0 (*)    All other components within normal limits  BRAIN NATRIURETIC PEPTIDE - Abnormal; Notable for the following components:   B Natriuretic Peptide 913.3 (*)    All other components within normal limits  I-STAT CHEM 8, ED - Abnormal; Notable for the following components:   Glucose, Bld 203 (*)    Hemoglobin 9.2 (*)    HCT 27.0 (*)    All other components within normal limits   ____________________________________________ Ordered   CXR -interstitial lung disease   CTA chest - no PE, CAD, cardiomegaly extensive bibasilar pulmonary infiltrate and consolidation    _________________________ Troponin 11 ECG: Ordered Personally reviewed by  me showing: HR : 82 Rhythm:  NSR,    nonspecific changes  QTC 441 ____________________   The recent clinical data is shown below. Vitals:   11/28/20 2130 11/28/20 2200 11/28/20 2215 11/28/20 2230  BP: (!) 100/53 (!) 104/58  (!) 98/55  Pulse: 79 79 76 76  Resp: (!) 33 (!) 24 (!) 39 (!) 32  Temp:      TempSrc:      SpO2: 94% 96% 91% 91%      WBC     Component Value Date/Time   WBC 6.1 11/28/2020 2149   LYMPHSABS 1.0 11/28/2020 2149   MONOABS 0.7 11/28/2020 2149   EOSABS 0.0 11/28/2020 2149   BASOSABS 0.1 11/28/2020 2149     Lactic Acid, Venous No results found for: LATICACIDVEN   Procalcitonin 0.14    UA   no evidence of UTI      Urine analysis:    Component Value Date/Time   COLORURINE AMBER (A) 11/29/2020 0026   APPEARANCEUR CLEAR 11/29/2020 0026   LABSPEC >1.046 (H) 11/29/2020 0026   PHURINE 6.0 11/29/2020 0026   GLUCOSEU NEGATIVE 11/29/2020 0026   HGBUR NEGATIVE 11/29/2020 0026   BILIRUBINUR NEGATIVE 11/29/2020 0026   KETONESUR NEGATIVE 11/29/2020 0026   PROTEINUR NEGATIVE 11/29/2020 0026   NITRITE NEGATIVE 11/29/2020 0026   LEUKOCYTESUR NEGATIVE 11/29/2020 0026    Results for orders placed or performed during the hospital encounter of 11/28/20  Resp Panel by RT-PCR (Flu A&B, Covid) Nasopharyngeal Swab     Status: None   Collection Time: 11/28/20 10:03 PM   Specimen: Nasopharyngeal Swab; Nasopharyngeal(NP) swabs in vial transport medium  Result Value Ref Range Status   SARS Coronavirus 2 by RT PCR NEGATIVE NEGATIVE Final         Influenza A by PCR NEGATIVE NEGATIVE Final   Influenza B by PCR NEGATIVE NEGATIVE Final          _______________________________________________ Hospitalist was called for admission for COPD, PNA, wound infection  The following Work up has been ordered so far:  Orders Placed This Encounter  Procedures  . Resp Panel by RT-PCR (Flu A&B, Covid) Nasopharyngeal Swab  . DG Chest Port 1 View  . CT Angio Chest PE W and/or Wo  Contrast  .  CBC with Differential/Platelet  . Comprehensive metabolic panel  . Blood gas, venous  . Brain natriuretic peptide  . Consult to hospitalist  . I-stat chem 8, ED (not at [facility] or [facility])  . EKG 12-Lead     Following Medications were ordered in ER: Medications  cefTRIAXone (ROCEPHIN) 1 g in sodium chloride 0.9 % 100 mL IVPB (has no administration in time range)  azithromycin (ZITHROMAX) 500 mg in sodium chloride 0.9 % 250 mL IVPB (has no administration in time range)  dexamethasone (DECADRON) injection 10 mg (10 mg Intravenous Given 11/28/20 2158)  albuterol (VENTOLIN HFA) 108 (90 Base) MCG/ACT inhaler 2 puff (2 puffs Inhalation Given 11/28/20 2158)  ipratropium (ATROVENT HFA) inhaler 2 puff (2 puffs Inhalation Given 11/28/20 2158)  iohexol (OMNIPAQUE) 350 MG/ML injection 100 mL (100 mLs Intravenous Contrast Given 11/28/20 2246)        Consult Orders  (From admission, onward)         Start     Ordered   11/29/20 0000  Consult to hospitalist  Once       Provider:  (Not yet assigned)  Question Answer Comment  Place call to: [facility]   Reason for Consult Admit      11/28/20 2359            OTHER Significant initial  Findings:  labs showing:    Recent Labs  Lab 11/28/20 2149 11/28/20 2204  NA 134* 136  K 4.1 4.2  CO2 23  --   GLUCOSE 212* 203*  BUN 24* 23  CREATININE 1.05 0.90  CALCIUM 8.4*  --     Cr  * stable,  Up from baseline see below Lab Results  Component Value Date   CREATININE 0.90 11/28/2020   CREATININE 1.05 11/28/2020   CREATININE 0.94 11/12/2020    Recent Labs  Lab 11/28/20 2149  AST 27  ALT 27  ALKPHOS 89  BILITOT 1.8*  PROT 7.2  ALBUMIN 2.5*   Lab Results  Component Value Date   CALCIUM 8.4 (L) 11/28/2020          Plt: Lab Results  Component Value Date   PLT 82 (L) 11/28/2020       COVID-19 Labs  No results for input(s): DDIMER, FERRITIN, LDH, CRP in the last 72 hours.  Lab Results  Component Value  Date   SARSCOV2NAA NEGATIVE 11/28/2020   [place] NEGATIVE 09/27/2020      Venous  Blood Gas result:  pH 74.3  pCO2  37.7           Recent Labs  Lab 11/28/20 2149 11/28/20 2204  WBC 6.1  --   NEUTROABS 3.4  --   HGB 9.6* 9.2*  HCT 30.9* 27.0*  MCV 81.3  --   PLT 82*  --     HG/HCT   stable,       Component Value Date/Time   HGB 9.2 (L) 11/28/2020 2204   HGB 9.9 (L) 11/12/2020 1342   HCT 27.0 (L) 11/28/2020 2204   HCT 33.3 (L) 06/18/2020 1508   MCV 81.3 11/28/2020 2149      No results for input(s): LIPASE, AMYLASE in the last 168 hours. No results for input(s): AMMONIA in the last 168 hours.   Cardiac Panel (last 3 results) No results for input(s): CKTOTAL, CKMB, TROPONINI, RELINDX in the last 72 hours.   BNP (last 3 results) Recent Labs    11/28/20 2149  BNP 913.3*      DM  labs:  HbA1C: No results for input(s): HGBA1C in the last 8760 hours.     CBG (last 3)  No results for input(s): GLUCAP in the last 72 hours.        Cultures: No results found for: SDES, SPECREQUEST, CULT, REPTSTATUS   Radiological Exams on Admission: CT Angio Chest PE W and/or Wo Contrast  Result Date: 11/28/2020 CLINICAL DATA:  Dyspnea EXAM: CT ANGIOGRAPHY CHEST WITH CONTRAST TECHNIQUE: Multidetector CT imaging of the chest was performed using the standard protocol during bolus administration of intravenous contrast. Multiplanar CT image reconstructions and MIPs were obtained to evaluate the vascular anatomy. CONTRAST:  11mL OMNIPAQUE IOHEXOL 350 MG/ML SOLN COMPARISON:  09/26/2020 FINDINGS: Cardiovascular: There is adequate opacification of the pulmonary arterial tree. There is no intraluminal filling defect identified to suggest acute pulmonary embolism. The central pulmonary arteries are of normal caliber. Mild atherosclerotic calcification within the thoracic aorta. The thoracic aorta is dilated, measuring 4.2 x 3.9 cm in its ascending segment (coronal # 55/sagittal # 85)  and 3.8 x 3.4 cm just beyond the takeoff of the left subclavian vein within the aortic arch (axial # 44/sagittal # 96). At the level of the diaphragmatic hiatus, the descending thoracic aorta measures 3.2 cm. Extensive multi-vessel coronary artery calcification. Mild global cardiomegaly with particular enlargement of the a right ventricle. No pericardial effusion. Mediastinum/Nodes: There has developed extensive bilateral hilar and mediastinal adenopathy, possibly reactive in nature given its relatively rapid development since prior examination. Index right hilar lymph node measures 2.3 cm at axial image # 70 and left hilar lymph node measures 1.5 cm at axial image # 74. The visualized thyroid is unremarkable. The esophagus is unremarkable. Lungs/Pleura: Severe centrilobular and paraseptal emphysema, more severe within the apices bilaterally. Since the prior examination, there has developed extensive bibasilar ground-glass pulmonary infiltrate and pulmonary consolidation, more severe within the right lower lobe. Small right pleural effusion has developed. Together, the findings are suspicious for atypical infection, aspiration, or less commonly, drug reaction. Small probable associated right parapneumonic effusion. No pneumothorax. The central airways are widely patent. Upper Abdomen: No acute abnormality. Musculoskeletal: No acute bone abnormality. No suspicious lytic or blastic bone lesion. Review of the MIP images confirms the above findings. IMPRESSION: No pulmonary embolism. Severe centrilobular emphysema. Interval development of extensive bibasilar pulmonary infiltrate and consolidation most in keeping with atypical infection in the acute setting with small associated right parapneumonic effusion. Less commonly, this may be seen with aspiration or drug reaction. Extensive mediastinal adenopathy may be reactive in nature given its relatively rapid development. Follow-up imaging in 3 months, once the patient  acute issues have resolved, would be helpful in documenting resolution. Extensive multi-vessel coronary artery calcification. Mild cardiomegaly with particular enlargement of the right ventricle. Dilation of the thoracic aorta with maximal dimension of 4.2 cm within the ascending segment. Recommend annual imaging followup by CTA or MRA. This recommendation follows 2010 ACCF/AHA/AATS/ACR/ASA/SCA/SCAI/SIR/STS/SVM Guidelines for the Diagnosis and Management of Patients with Thoracic Aortic Disease. Circulation. 2010; 121: P536-R443. Aortic aneurysm NOS (ICD10-I71.9) Aortic aneurysm NOS (ICD10-I71.9). Aortic Atherosclerosis (ICD10-I70.0) and Emphysema (ICD10-J43.9). Electronically Signed   By: Fidela Salisbury MD   On: 11/28/2020 23:19   DG Chest Port 1 View  Result Date: 11/28/2020 CLINICAL DATA:  Chronic oxygen dependent patient presenting with a 1 week history of progressively worsening shortness of breath and hypoxemia (oxygen saturation in the 70s and 80s). Current history of UIP/pulmonary fibrosis. EXAM: PORTABLE CHEST 1 VIEW COMPARISON:  Chest x-ray 07/15/2020. High-resolution CT  chest 09/26/2020. FINDINGS: Cardiac silhouette upper normal in size for AP portable technique. Interstitial opacities throughout both lungs which are not felt to be significantly changed since the high-resolution CT. More confluent opacity peripherally in the mid RIGHT lung was shown on the CT to represent scarring. IMPRESSION: Chronic interstitial lung disease/pulmonary fibrosis. No convincing evidence of acute cardiopulmonary disease. Electronically Signed   By: Evangeline Dakin M.D.   On: 11/28/2020 21:55   _______________________________________________________________________________________________________ Latest  Blood pressure (!) 98/55, pulse 76, temperature 98.2 F (36.8 C), temperature source Oral, resp. rate (!) 32, SpO2 91 %.   Review of Systems:    Pertinent positives include:   Fatigue,  shortness of breath at  rest.   dyspnea on exertion,  non-productive cough,  Constitutional:  No weight loss, night sweats, Fevers, chills,  weight loss  HEENT:  No headaches, Difficulty swallowing,Tooth/dental problems,Sore throat,  No sneezing, itching, ear ache, nasal congestion, post nasal drip,  Cardio-vascular:  No chest pain, Orthopnea, PND, anasarca, dizziness, palpitations.no Bilateral lower extremity swelling  GI:  No heartburn, indigestion, abdominal pain, nausea, vomiting, diarrhea, change in bowel habits, loss of appetite, melena, blood in stool, hematemesis Resp:  no No excess mucus, no productive cough, No No coughing up of blood.No change in color of mucus.No wheezing. Skin:  no rash or lesions. No jaundice GU:  no dysuria, change in color of urine, no urgency or frequency. No straining to urinate.  No flank pain.  Musculoskeletal:  No joint pain or no joint swelling. No decreased range of motion. No back pain.  Psych:  No change in mood or affect. No depression or anxiety. No memory loss.  Neuro: no localizing neurological complaints, no tingling, no weakness, no double vision, no gait abnormality, no slurred speech, no confusion  All systems reviewed and apart from [place] all are negative _______________________________________________________________________________________________ Past Medical History:   Past Medical History:  Diagnosis Date  . Adenomatous colon polyp   . Cataract   . Diabetes mellitus   . Emphysema   . Emphysema of lung ([place])   . Erectile dysfunction   . Hyperlipidemia   . Hypertension   . Peripheral vascular disease ([place])   . Pneumonia   . Pulmonary nodule       Past Surgical History:  Procedure Laterality Date  . ABDOMINAL AORTIC ANEURYSM REPAIR  2004  . COLONOSCOPY  multiple since 2004    Social History:      reports that he quit smoking about 6 years ago. His smoking use included cigarettes. He has a 90.00 pack-year smoking history. He has never  used smokeless tobacco. He reports current alcohol use. He reports that he does not use drugs.   Family History:   Family History  Problem Relation Age of Onset  . Heart failure Mother   . Diabetes Mother   . Cancer Father        lung  . Colon cancer Neg Hx   . Esophageal cancer Neg Hx   . Liver cancer Neg Hx   . Pancreatic cancer Neg Hx   . Rectal cancer Neg Hx   . Stomach cancer Neg Hx    ______________________________________________________________________________________________ Allergies: No Known Allergies   Prior to Admission medications   Medication Sig Start Date End Date Taking? Authorizing Provider  acetaminophen (TYLENOL) 325 MG tablet Take 325 mg by mouth every 6 (six) hours as needed for mild pain.   Yes [provider]  ANORO ELLIPTA 62.5-25 MCG/INH AEPB Take 1 puff by  mouth daily. 08/20/17  Yes [provider]  Ascorbic Acid (VITAMIN C) 500 MG CAPS Take 1 capsule by mouth 2 (two) times daily.   Yes [provider]  Cholecalciferol (VITAMIN D PO) Take 800 Units by mouth daily.   Yes [provider]  Cyanocobalamin (VITAMIN B 12 PO) Take 500 mcg by mouth daily.   Yes [provider]  mirtazapine (REMERON) 15 MG tablet Take 45 mg by mouth at bedtime. 09/03/20  Yes [provider]  b complex vitamins capsule Take 1 capsule by mouth daily.    [provider]  [facility] VERIO test strip  07/12/18   [provider]    ___________________________________________________________________________________________________ Physical Exam: Vitals with BMI 11/28/2020 11/28/2020 11/28/2020  Height - - -  Weight - - -  BMI - - -  Systolic 98 - 361  Diastolic 55 - 58  Pulse 76 76 79     1. General:  in No  Acute distress   Chronically ill  -appearing 2. Psychological: Alert and  Oriented 3. Head/ENT:    Dry Mucous Membranes                          Head Non traumatic, neck supple wound on right side close  to ear                           Poor Dentition 4. SKIN:  decreased Skin turgor,  Skin clean Dry and intact no rash 5. Heart: Regular rate and rhythm no Murmur, no Rub or gallop 6. Lungs: no wheezes or crackles   7. Abdomen: Soft,  non-tender, Non distended bowel sounds present 8. Lower extremities: no clubbing, cyanosis, no edema 9. Neurologically Grossly intact, moving all 4 extremities equally  10. MSK: Normal range of motion    Chart has been reviewed  ______________________________________________________________________________________________  Assessment/Plan 75 y.o. 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 CAP (community acquired pneumonia) -  - will admit for treatment of CAP will start on appropriate antibiotic coverage.   Obtain:  sputum cultures,                  Obtain respiratory panel and influenza serologies                 blood cultures if febrile or if decompensates.                   strep pneumo UA antigen,                   check for Legionella.                Provide oxygen as needed.  Currently back to baseline of 4 L  . IPF (idiopathic pulmonary fibrosis) (HCC) -chronic contributing to chronic respiratory failure with hypoxia.  Would benefit from follow-up with pulmonology as an outpatient  . 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  Hold Lovenox  . Hyperlipidemia -stable no longer on statin  . HTN (hypertension) allow permissive hypertension  . Coronary artery calcification -given abnormal EKG with ST depressions will obtain echogram, troponin unremarkable patient does not endorse any chest pain could be demand in the setting  of hypoxia maintain oxygenation  . COPD exacerbation ([place]) not sure how much is  occurring a role in current hypoxia no significant wheezing continue albuterol as needed and DuoNeb hold off on steroids for now patient already received a dose in the ER given significant facial infection Other plan as per orders.  Cellulitis - next to right ear at the site of prior parotid surgery would benefit from further ENT evaluation continue broad-spectrum antibiotics for now. Does not appear to be tracking but may benefit from additional imaging will hold off on contrasted studies for tonight as he just gotten CTa, to protect renal function   Dm2 -  - Order Sensitive  SSI    -  check TSH and HgA1C   Iron deficiency anemia chronic - patient has been followed by oncology currently stable continue to monitor  DVT prophylaxis:  SCD        Code Status:    Code Status: Not on file FULL CODE   as per patient   I had personally discussed CODE STATUS with patient     Family Communication:   Family not at  Bedside   Disposition Plan:    To home once workup is complete and patient is stable   Following barriers for discharge:                            Electrolytes corrected                               Anemia stable                                                            white count improving able to transition to PO antibiotics                             Will need to be able to tolerate PO                                                      Will need consultants to evaluate patient prior to discharge                      Would benefit from PT/OT eval prior to DC  Ordered                                                           Transition of care consulted                   Nutrition    consulted                  Wound care  consulted  Consults called: please discuss with ENT in AM given Periauricular wound    Admission status:  ED Disposition    ED Disposition Condition [facility] [place] [[id number]]  Level of Care: Telemetry [5]  Admit to tele based on following criteria: Other see comments  Comments: dyspnea  May admit patient to [facility] or [facility] if equivalent level of care is available:: No  Covid Evaluation: Confirmed COVID Negative  Diagnosis: COPD exacerbation [facility]) [[id number]]  Admitting Physician: Toy Baker [[id number]]  Attending Physician: Toy Baker [[id number]]  Estimated length of stay: past midnight tomorrow  Certification:: I certify this patient will need inpatient services for at least 2 midnights          inpatient     I Expect 2 midnight stay secondary to severity of patient's current illness need for inpatient interventions justified by the following:  hemodynamic instability despite optimal treatment ( hypoxia )   Severe lab/radiological/exam abnormalities including:    Pneumonia and extensive comorbidities including:    DM2      CAD  COPD/asthma   *  malignancy, .    That are currently affecting medical management.   I expect  patient to be hospitalized for 2 midnights requiring inpatient medical care.  Patient is at high risk for adverse outcome (such as loss of life or disability) if not treated.  Indication for inpatient stay as follows:    New or worsening hypoxia  Need for IV antibiotics, IV fluids,     Level of care    tele  For  24H       Lab Results  Component Value Date   [place] NEGATIVE 11/28/2020     Precautions: admitted as   Covid Negative      PPE: Used by the provider:   N95  eye Goggles,  Gloves     Neaveh Belanger 11/29/2020, 1:30 AM    [facility]     after 2 AM please page floor coverage PA If 7AM-7PM, please contact the day team taking care of the patient using [email or web address]   Patient was evaluated in the context of the global COVID-19 pandemic, which necessitated consideration that the patient might be at risk for infection with the SARS-CoV-2 virus that  causes COVID-19. Institutional protocols and algorithms that pertain to the evaluation of patients at risk for COVID-19 are in a state of rapid change based on information released by regulatory bodies including the CDC and federal and state organizations. These policies and algorithms were followed during the patient's care.

## 2020-11-29 NOTE — Progress Notes (Addendum)
Pharmacy Antibiotic Note  Geoffrey Wood is a 75 y.o. male admitted on 11/28/2020 with pneumonia.  PMH significant for DM, COPD, interstitial pulmonary fibrosis, HTN, metastatic squamous cell carcinoma.  Ceftriaxone and Azithromycin initiated for treatment of CAP.  Pharmacy has been consulted for Vancomycin dosing for treatment of wound infection on head next to ear.  Plan: - Vancomycin 1250 mg IV Q 24 hrs. Goal AUC 400-550.  Expected AUC: 467   SCr used: 0.9 - Ceftriaxone/Azithromycin per MD - f/u culture results/sensitivities - monitor vancomycin levels as needed    Temp (24hrs), Avg:98.2 F (36.8 C), Min:98.2 F (36.8 C), Max:98.2 F (36.8 C)  Recent Labs  Lab 11/28/20 2149 11/28/20 2204 11/29/20 0103  WBC 6.1  --  6.3  CREATININE 1.05 0.90 0.90    Estimated Creatinine Clearance: 63.4 mL/min (by C-G formula based on SCr of 0.9 mg/dL).    No Known Allergies  Antimicrobials this admission: 4/15  Azithromycin >>   4/15 Ceftriaxone >>   4/15 Vancomycin >>  Dose adjustments this admission:    Microbiology results: 4/15 BCx:    Thank you for allowing pharmacy to be a part of this patient's care.  Everette Rank, PharmD 11/29/2020 1:42 AM

## 2020-11-29 NOTE — Evaluation (Signed)
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   Clinical Impression   Patient lives with spouse in a two level home with 5 steps to enter and 15 steps to bedroom. Patient reports he is independent at baseline and does not wear home O2. Currently patient on 4L simple face mask, maintain saturations 90% at edge of bed and 93% post chair transfer. Patient overall supervision level for safety due to mild unsteadiness and decreased activity tolerance. Recommend continued acute OT services to maximize patient safety and endurance in order to return home with spouse assist as needed.     Follow Up Recommendations  No OT follow up;Supervision - Intermittent    Equipment Recommendations  None recommended by OT       Precautions / Restrictions Precautions Precaution Comments: monitor sats Restrictions Weight Bearing Restrictions: No      Mobility Bed Mobility Overal bed mobility: Modified Independent                  Transfers Overall transfer level: Needs assistance Equipment used: None Transfers: Stand Pivot Transfers   Stand pivot transfers: Supervision            Balance Overall balance assessment: Mild deficits observed, not formally tested                                         ADL either performed or assessed with clinical judgement   ADL Overall ADL's : Needs assistance/impaired     Grooming: Set up;Sitting   Upper Body Bathing: Set up;Sitting   Lower Body Bathing: Supervison/ safety;Sit to/from stand   Upper Body Dressing : Set up;Sitting   Lower Body Dressing: Supervision/safety;Sit to/from  stand   Toilet Transfer: Supervision/safety;Stand-pivot Armed forces technical officer Details (indicate cue type and reason): patient able to take few steps to recliner without AD at supervision level for safety, O2 reading 93% once in chair Toileting- Clothing Manipulation and Hygiene: Supervision/safety;Sitting/lateral lean;Sit to/from stand       Functional mobility during ADLs: Supervision/safety General ADL Comments: patient appears close to his baseline, reports to OT that he does not use O2 at home however per chart states wears 4L at baseline?                  Pertinent Vitals/Pain Pain Assessment: No/denies pain     Hand Dominance Right   Extremity/Trunk Assessment Upper Extremity Assessment Upper Extremity Assessment: Overall WFL for tasks assessed   Lower Extremity Assessment Lower Extremity Assessment: Defer to PT evaluation       Communication Communication Communication: HOH   Cognition Arousal/Alertness: Awake/alert Behavior During Therapy: WFL for tasks assessed/performed Overall Cognitive Status: Within Functional Limits for tasks assessed                                     General Comments  patient on 4L simple face mask, maintained O2 saturations throughout session. Patient 90% seated EOB, 93% post chair transfer            Home Living  Family/patient expects to be discharged to:: Private residence Living Arrangements: Spouse/significant other Available Help at Discharge: Family Type of Home: House Home Access: Stairs to enter Technical brewer of Steps: 5   Home Layout: Two level;Bed/bath upstairs Alternate Level Stairs-Number of Steps: 15   Bathroom Shower/Tub: Occupational psychologist: Standard     Home Equipment: Environmental consultant - 2 wheels;Shower seat - built in;Grab bars - tub/shower          Prior Functioning/Environment Level of Independence: Independent        Comments: does not wear home O2        OT  Problem List: Cardiopulmonary status limiting activity;Decreased activity tolerance      OT Treatment/Interventions: Therapeutic exercise;Energy conservation;DME and/or AE instruction;Therapeutic activities;Patient/family education;Self-care/ADL training    OT Goals(Current goals can be found in the care plan section) Acute Rehab OT Goals Patient Stated Goal: fel better OT Goal Formulation: With patient Time For Goal Achievement: 12/13/20 Potential to Achieve Goals: Good  OT Frequency: Min 2X/week    AM-PAC OT "6 Clicks" Daily Activity     Outcome Measure Help from another person eating meals?: None Help from another person taking care of personal grooming?: A Little Help from another person toileting, which includes using toliet, bedpan, or urinal?: A Little Help from another person bathing (including washing, rinsing, drying)?: A Little Help from another person to put on and taking off regular upper body clothing?: A Little Help from another person to put on and taking off regular lower body clothing?: None 6 Click Score: 20   End of Session Equipment Utilized During Treatment: Oxygen Nurse Communication: Mobility status  Activity Tolerance: Patient tolerated treatment well Patient left: in chair;with call bell/phone within reach  OT Visit Diagnosis: Other abnormalities of gait and mobility (R26.89)                Time: 6381-7711 OT Time Calculation (min): 18 min Charges:  OT General Charges $OT Visit: 1 Visit OT Evaluation $OT Eval Low Complexity: 1 Low  Delbert Phenix OT OT pager: [place] 11/29/2020, 10:41 AM

## 2020-11-29 NOTE — Evaluation (Signed)
Physical Therapy Evaluation Patient Details Name: Geoffrey Wood MRN: [id number] DOB: November 12, 1945 Today's Date: 11/29/2020   History of Present Illness  75 yo presents to [facility] on 4/14 with shortness of breath x1 week. CTA negative for PE, positive for infiltrates and consolidation concerning for PNA. Workup also for COPD exacerbation. 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  Clinical Impression   Pt presents with [facility] strength, dyspnea on exertion with accompanying O2 desaturation, and decreased activity tolerance vs baseline. Pt to benefit from acute PT to address deficits. Pt ambulated hallway distance without AD and supervision for safety, pt requiring multiple PT-cued standing rest breaks and cues for breathing technique to recover SpO2. Pt preferring to wear O2 mask vs [place] because pt states "I am a mouth breather". Pt currently requiring 4-6LO2 during mobility, and does desat to 83% during mobility, rebounds >88% quickly. PT anticipates no PT needs post-acutely. PT to progress mobility as tolerated, and will continue to follow acutely.      Follow Up Recommendations Supervision for mobility/OOB;No PT follow up    Equipment Recommendations  None recommended by PT    Recommendations for Other Services       Precautions / Restrictions Precautions Precautions: Fall Precaution Comments: monitor sats Restrictions Weight Bearing Restrictions: No      Mobility  Bed Mobility Overal bed mobility: Modified Independent                  Transfers Overall transfer level: Needs assistance Equipment used: None Transfers: Sit to/from Stand Sit to Stand: Min guard Stand pivot transfers: Supervision       General transfer comment: for safety, management of lines/leads. No physical assist provided  Ambulation/Gait Ambulation/Gait assistance: Supervision Gait Distance (Feet): 360  Feet Assistive device: None Gait Pattern/deviations: Step-through pattern;Decreased stride length Gait velocity: slightly decr   General Gait Details: supervision for safety, multiple standing rest breaks required (cued by PT) to recover SpO2 to >88%. Pt with SPO2 83-90% on 4LO2 during gait, increased O2 to 6LO2 for last 100 ft given difficulty recovering sats even with cues for rest and pursed lip breathing.  Stairs            Wheelchair Mobility    Modified Rankin (Stroke Patients Only)       Balance Overall balance assessment: Mild deficits observed, not formally tested                                           Pertinent Vitals/Pain Pain Assessment: No/denies pain    Home Living Family/patient expects to be discharged to:: Private residence Living Arrangements: Spouse/significant other Available Help at Discharge: Family Type of Home: House Home Access: Stairs to enter   Technical brewer of Steps: [place]: Two level;Bed/bath upstairs Home Equipment: [place] - 2 wheels;Shower seat - built in;Grab bars - tub/shower      Prior Function Level of Independence: Independent         Comments: does not wear home O2, enjoys playing golf and gambling     Hand Dominance   Dominant Hand: Right    Extremity/Trunk Assessment   Upper Extremity Assessment Upper Extremity Assessment: Defer to OT evaluation    Lower Extremity Assessment Lower Extremity Assessment: Overall WFL for tasks assessed    Cervical / Trunk Assessment Cervical /  Trunk Assessment: Normal  Communication   Communication: HOH  Cognition Arousal/Alertness: Awake/alert Behavior During Therapy: WFL for tasks assessed/performed Overall Cognitive Status: Within Functional Limits for tasks assessed                                 General Comments: very HOH, at times requires repeated cues only due to this      General Comments General comments (skin  integrity, edema, etc.): 4-6LO2 via O2 mask, pt reporting difficulty with breathing through nose. SPO2 83-90% during gait, 97% on 5LO2 at rest    Exercises     Assessment/Plan    PT Assessment Patient needs continued PT services  PT Problem List Decreased mobility;Decreased activity tolerance;Decreased safety awareness;Cardiopulmonary status limiting activity       PT Treatment Interventions Therapeutic activities;Gait training;Therapeutic exercise;Patient/family education;Balance training;Functional mobility training;Stair training    PT Goals (Current goals can be found in the Care Plan section)  Acute Rehab PT Goals Patient Stated Goal: fel better, back to golf PT Goal Formulation: With patient Time For Goal Achievement: 12/13/20 Potential to Achieve Goals: Good    Frequency Min 3X/week   Barriers to discharge        Co-evaluation               AM-PAC PT "6 Clicks" Mobility  Outcome Measure Help needed turning from your back to your side while in a flat bed without using bedrails?: None Help needed moving from lying on your back to sitting on the side of a flat bed without using bedrails?: None Help needed moving to and from a bed to a chair (including a wheelchair)?: None Help needed standing up from a chair using your arms (e.g., wheelchair or bedside chair)?: None Help needed to walk in hospital room?: A Little Help needed climbing 3-5 steps with a railing? : A Little 6 Click Score: 22    End of Session Equipment Utilized During Treatment: Oxygen Activity Tolerance: Patient tolerated treatment well Patient left: in chair;with call bell/phone within reach Nurse Communication: Mobility status PT Visit Diagnosis: Other abnormalities of gait and mobility (R26.89);Difficulty in walking, not elsewhere classified (R26.2)    Time: 2876-8115 PT Time Calculation (min) (ACUTE ONLY): 20 min   Charges:   PT Evaluation $PT Eval Low Complexity: 1 Low          [name], PT DPT Acute Rehabilitation Services Pager [phone]  Office [phone]   [name] 11/29/2020, 12:57 PM

## 2020-11-29 NOTE — Progress Notes (Signed)
Initial Nutrition Assessment  DOCUMENTATION CODES:   Severe malnutrition in context of chronic illness  INTERVENTION:   -Anda [employer] 1.4 po TID, each supplement provides 455 kcal and 20 grams protein.   -D/c Ensure  -Placed order for lunch  -Beneprotein TID with meals, each provides 25 kcals and 6g protein  -Magic cup TID with meals, each supplement provides 290 kcal and 9 grams of protein  -Multivitamin with minerals daily  NUTRITION DIAGNOSIS:   Severe Malnutrition related to chronic illness,cancer and cancer related treatments,catabolic illness as evidenced by percent weight loss,severe fat depletion,severe muscle depletion.  GOAL:   Patient will meet greater than or equal to 90% of their needs  MONITOR:   PO intake,Supplement acceptance,Labs,Weight trends,I & O's,Skin  REASON FOR ASSESSMENT:   Consult COPD Protocol  ASSESSMENT:   75 y.o. 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 Pt extremely HOH. Was able to answer my questions if he heard. Pt states he did not like his breakfast this morning, "tasted off". States he just had some ice cream instead.    Pt has had taste changes per chart review. Followed by [place] RD, last spoke to pt's wife on 4/12. Pt has been subsisting on liquid diet mainly consisting of homemade smoothies, [employer] supplements and Benecalorie.  Will order [employer], Beneprotein and Magic cup supplements. Pt states he had not ordered lunch, RD placed order for soup and other full liquids.  Pt MD note, SLP to be consulted.  Per weight records, pt has lost 13 lbs since 08/07/20 (8% wt loss x 4 months, significant for time frame).  Medications reviewed.  Labs reviewed: CBGs: 192-291 Low  Na  NUTRITION - FOCUSED PHYSICAL EXAM:  Flowsheet Row Most Recent Value  Orbital Region Severe depletion  Upper Arm Region Severe depletion  Thoracic and Lumbar Region Unable to assess  Buccal Region Severe depletion  Temple Region Severe depletion  Clavicle Bone Region Severe depletion  Clavicle and Acromion Bone Region Severe depletion  Scapular Bone Region Severe depletion  Dorsal Hand Severe depletion  Patellar Region Unable to assess  Anterior Thigh Region Unable to assess  Posterior Calf Region Unable to assess  Edema (RD Assessment) None  Hair Reviewed  Eyes Reviewed  Mouth Reviewed  Skin Reviewed       Diet Order:   Diet Order            Diet Carb Modified Fluid consistency: Thin; Room service appropriate? Yes  Diet effective now                 EDUCATION NEEDS:   Education needs have been addressed  Skin:  Skin Assessment: Skin Integrity Issues: Skin Integrity Issues:: Other (Comment) Other: non-pressure wound on right side of face near ear  Last BM:  PTA  Height:   Ht Readings from Last 1 Encounters:  11/12/20 6' (1.829 m)    Weight:   Wt Readings from Last 1 Encounters:  11/26/20 65.2 kg    BMI:  19.4 kg/m^2  Estimated Nutritional Needs:   Kcal:  2100-2300  Protein:  100-115g  Fluid:  2.3L/day  Clayton Bibles, MS, RD, LDN Inpatient Clinical Dietitian Contact information available via Amion

## 2020-11-29 NOTE — Consult Note (Addendum)
WOC Nurse Consult Note: Reason for Consult: Full thickness wound on right side of head, near ear.  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 Further interventions are outside of the scope of [place] nursing practice and I will defer to ENT or other specialty medicine providers (eg., oncology) for further interventions..  See excerpt from Dr. Rueben Bash note dated earlier today.   Cellulitis - next to right ear at the site of prior parotid surgery would benefit from further ENT evaluation continue broad-spectrum antibiotics for now.  I agree with the consultation from ENT for further guidance in the care of this wound. Oncology providers may also be of some assistance. If you agree, please consider consults.  [place] nursing team will not follow, but will remain available to this patient, the nursing and medical teams.  Please re-consult if needed. Thanks, Maudie Flakes, MSN, RN, [place], Arther Abbott  Pager# [phone]

## 2020-11-29 NOTE — Progress Notes (Incomplete)
Echocardiogram completed.

## 2020-11-30 ENCOUNTER — Inpatient Hospital Stay (HOSPITAL_COMMUNITY): Payer: Medicare Other

## 2020-11-30 DIAGNOSIS — E43 Unspecified severe protein-calorie malnutrition: Secondary | ICD-10-CM | POA: Insufficient documentation

## 2020-11-30 DIAGNOSIS — J189 Pneumonia, unspecified organism: Secondary | ICD-10-CM | POA: Diagnosis not present

## 2020-11-30 DIAGNOSIS — J849 Interstitial pulmonary disease, unspecified: Secondary | ICD-10-CM

## 2020-11-30 DIAGNOSIS — I2510 Atherosclerotic heart disease of native coronary artery without angina pectoris: Secondary | ICD-10-CM | POA: Diagnosis not present

## 2020-11-30 DIAGNOSIS — J9601 Acute respiratory failure with hypoxia: Secondary | ICD-10-CM

## 2020-11-30 DIAGNOSIS — J432 Centrilobular emphysema: Secondary | ICD-10-CM | POA: Diagnosis not present

## 2020-11-30 DIAGNOSIS — J441 Chronic obstructive pulmonary disease with (acute) exacerbation: Secondary | ICD-10-CM | POA: Diagnosis not present

## 2020-11-30 DIAGNOSIS — I10 Essential (primary) hypertension: Secondary | ICD-10-CM | POA: Diagnosis not present

## 2020-11-30 LAB — GLUCOSE, CAPILLARY
Glucose-Capillary: 164 mg/dL — ABNORMAL HIGH (ref 70–99)
Glucose-Capillary: 148 mg/dL — ABNORMAL HIGH (ref 70–99)
Glucose-Capillary: 225 mg/dL — ABNORMAL HIGH (ref 70–99)
Glucose-Capillary: 226 mg/dL — ABNORMAL HIGH (ref 70–99)
Glucose-Capillary: 155 mg/dL — ABNORMAL HIGH (ref 70–99)
Glucose-Capillary: 199 mg/dL — ABNORMAL HIGH (ref 70–99)

## 2020-11-30 MED ORDER — RESOURCE THICKENUP CLEAR PO POWD
ORAL | Status: DC | PRN
  Filled 2020-11-30: qty 125

## 2020-11-30 MED ORDER — PANTOPRAZOLE SODIUM 40 MG PO TBEC
40.0000 mg | DELAYED_RELEASE_TABLET | Freq: Every day | ORAL | Status: DC
  Administered 2020-11-30 – 2020-12-04 (×5): 40 mg via ORAL
  Filled 2020-11-30 (×5): qty 1

## 2020-11-30 MED ORDER — PREDNISONE 50 MG PO TABS
60.0000 mg | ORAL_TABLET | Freq: Every day | ORAL | Status: DC
  Administered 2020-11-30 – 2020-12-03 (×4): 60 mg via ORAL
  Filled 2020-11-30 (×4): qty 1

## 2020-11-30 MED ORDER — FUROSEMIDE 10 MG/ML IJ SOLN
40.0000 mg | Freq: Once | INTRAMUSCULAR | Status: AC
  Administered 2020-11-30: 40 mg via INTRAVENOUS
  Filled 2020-11-30 (×2): qty 4

## 2020-11-30 NOTE — Evaluation (Signed)
Clinical/Bedside Swallow Evaluation Patient Details  Name: Geoffrey Wood MRN: [id number] Date of Birth: 08/28/1945  Today's Date: 11/30/2020 Time: SLP Start Time (ACUTE ONLY): 0900 SLP Stop Time (ACUTE ONLY): 0910 SLP Time Calculation (min) (ACUTE ONLY): 10 min  Past Medical History:  Past Medical History:  Diagnosis Date  . Adenomatous colon polyp   . Cataract   . Diabetes mellitus   . Emphysema   . Emphysema of lung ([place])   . Erectile dysfunction   . Hyperlipidemia   . Hypertension   . Peripheral vascular disease ([place])   . Pneumonia   . Pulmonary nodule    Past Surgical History:  Past Surgical History:  Procedure Laterality Date  . ABDOMINAL AORTIC ANEURYSM REPAIR  2004  . COLONOSCOPY  multiple since 2004   HPI:  Geoffrey Wood is a 75 y.o. male with a history of COPD, IPF recently started on 4L O2 at home, [facility] of right parotid s/p excision with chronic wound, ITP, HTN, and hard of hearing who presented to the ED late 4/14 for progressive shortness of breath and hypoxia to 70-80%'s despite starting 4L O2 at home. CT Chest shows "Severe centrilobular and paraseptal emphysema, more  severe within the apices bilaterally. Since the prior examination,  there has developed extensive bibasilar ground-glass pulmonary  infiltrate and pulmonary consolidation, more severe within the right  lower lobe. Small right pleural effusion has developed. 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 On 06-03-20 Dr. Nicolette Wood performed rt parotidectomy with facial nerve dissection which ultimately req'd sacrifice of upper division of rt facial nerve. Rt selective neck dissection also that date.  Pt had three sessions of OP SLP therapy due to facial nerve paresis. Per  ENT "He also recieved postoperative radiation therapy but did not f/u with SLP therapy after that.  Since that time he is always had some hearing problems and wears hearing aids but his hearing in his right ear has been worse.  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   Assessment / Plan / Recommendation Clinical Impression  Pt demosntrates no overt signs of aspiration with thin liquids (pt only consumed liquids and purees - cannot masticate since facial nerve injury) clinically and denies difficulty swallowing, however he has potential for decreased sensation of aspiration given history of head and neck radiation and also has CT chest concerning for aspiration. He reports "everything changed since radiation." Will proceed with instrumental assessment for more objective assessment of swallowing. Pt in agreement. SLP Visit Diagnosis: Dysphagia, unspecified (R13.10)    Aspiration Risk  Moderate aspiration risk;Risk for inadequate nutrition/hydration    Diet Recommendation Dysphagia 1 (Puree);Thin liquid   Liquid Administration via: Cup;Straw Medication Administration: Whole meds with liquid Supervision: Patient able to self feed Compensations: Slow rate;Small sips/bites Postural Changes: Seated upright at 90 degrees    Other  Recommendations     Follow up Recommendations        Frequency and Duration            Prognosis        Swallow Study   General HPI: Geoffrey Wood is a 75 y.o. male with a history of COPD, IPF recently started on 4L O2 at home, [facility] of  right parotid s/p excision with chronic wound, ITP, HTN, and hard of hearing who presented to the ED late 4/14 for progressive shortness of breath and hypoxia to 70-80%'s despite starting 4L O2 at home. CT Chest shows "Severe centrilobular and paraseptal emphysema, more  severe within the apices bilaterally. Since the prior examination,  there has developed extensive  bibasilar ground-glass pulmonary  infiltrate and pulmonary consolidation, more severe within the right  lower lobe. Small right pleural effusion has developed. 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 On 06-03-20 Dr. Nicolette Wood performed rt parotidectomy with facial nerve dissection which ultimately req'd sacrifice of upper division of rt facial nerve. Rt selective neck dissection also that date.  Pt had three sessions of OP SLP therapy due to facial nerve paresis. Per ENT "He also recieved postoperative radiation therapy but did not f/u with SLP therapy after that.  Since that time he is always had some hearing problems and wears hearing aids but his hearing in his right ear has been worse.  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 Type of Study: Bedside Swallow Evaluation Previous Swallow Assessment: OP SLP Diet Prior to this Study: Regular;Thin liquids Temperature Spikes Noted: No Respiratory Status: Venti-mask History of Recent Intubation: No Behavior/Cognition: Alert;Cooperative;Pleasant mood Oral Cavity Assessment: Within Functional Limits Oral Care Completed by SLP: No Oral Cavity - Dentition: Missing dentition Vision: Functional for self-feeding Self-Feeding Abilities: Able to feed self Patient Positioning: Upright in chair Baseline Vocal Quality: Normal Volitional Cough: Strong Volitional Swallow: Able to elicit    Oral/Motor/Sensory Function Overall Oral Motor/Sensory Function: Severe impairment Facial ROM: Reduced right;Suspected CN VII (facial) dysfunction Facial Symmetry: Suspected CN VII (facial) dysfunction;Abnormal symmetry right Facial Strength: Reduced right;Suspected CN VII  (facial) dysfunction Lingual ROM: Reduced right;Suspected CN XII (hypoglossal) dysfunction Lingual Symmetry: Abnormal symmetry right Lingual Strength: Within Functional Limits Lingual Sensation: Within Functional Limits Velum: Within Functional Limits Mandible: Impaired (decreased mandibular movement)   Ice Chips Ice chips: Not tested   Thin Liquid Thin Liquid: Within functional limits Presentation: Straw;Self Fed    Nectar Thick Nectar Thick Liquid: Not tested   Honey Thick Honey Thick Liquid: Not tested   Puree Puree: Not tested   Solid     Solid: Not tested     [name], MA CCC-SLP  Acute Rehabilitation Services Pager [phone] Office [phone]  [name] 11/30/2020,10:40 AM

## 2020-11-30 NOTE — Progress Notes (Signed)
Modified Barium Swallow Progress Note  Patient Details  Name: Geoffrey Wood MRN: [id number] Date of Birth: 1946/08/07  Today's Date: 11/30/2020  Modified Barium Swallow completed.  Full report located under Chart Review in the Imaging Section.  Brief recommendations include the following:  Clinical Impression  Pt demonstrates a mild to moderate dysphagia with trace to mild sensed aspiration events that now pt reports are new over the past few days.  Oral deficits include anterior loss during chin tuck, base of tongue weakness, likely on the right leading to mild premature spillage during bolus formation with trace penetration events before the swallow and one instance of significant aspiration with hard cough. Pt also has decreased laryngeal closure during the swallow though epiglottic deflection and laryngal elevation with glottic clousre are quite complete and sustained in many attempts. Suspect decreased seal of arytenoid tissue leading to penetration during the swallow and eventual trace aspiration as glottic closure is released. Pt typically senses this with a throat clear or cough, though not all aspirate is cleared. Nectar thick liquids and puree are tolerated without aspiration and a cue for an effortful swallow eliminates mild vallecular residue that was otherwise present. Generally, this dysphagia is mild and possibly temporary if pt can improve conditioning and general strength and pulmonary hygiene. Recommend puree (pts baseline as he does not masticate solids since surgery)and nectar thick liquids during acute phase of rehabilitation. Will benefit from RMST, training for preventative cough strategy and strengthning for increased laryngeal closure as well as preventative oral hygiene measures.   Swallow Evaluation Recommendations       SLP Diet Recommendations: Dysphagia 1 (Puree) solids;Nectar thick liquid   Liquid Administration via: Straw   Medication Administration: Whole meds  with liquid   Supervision: Patient able to self feed   Compensations: Slow rate;Small sips/bites;Effortful swallow;Clear throat after each swallow              [name], MA [place] Pager [phone] Office [phone]   [name] 11/30/2020,11:54 AM

## 2020-11-30 NOTE — Progress Notes (Signed)
Physical Therapy Treatment Patient Details Name: Geoffrey Wood MRN: [id number] DOB: 1946-01-18 Today's Date: 11/30/2020    History of Present Illness 75 yo presents to [facility] on 4/14 with shortness of breath x1 week. CTA negative for PE, positive for infiltrates and consolidation concerning for PNA. Workup also for COPD exacerbation. 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    PT Comments    Pt with increased O2 needs and unable to ambulate as far with PT today.  Needing cues for rest breaks and breathing techniques as well as for RW use and posture. Continue plan of care.    Follow Up Recommendations  Supervision for mobility/OOB;Home health PT     Equipment Recommendations  None recommended by PT    Recommendations for Other Services       Precautions / Restrictions Precautions Precautions: Fall Precaution Comments: monitor sats    Mobility  Bed Mobility               General bed mobility comments: in chair at arrival    Transfers Overall transfer level: Needs assistance Equipment used: None Transfers: Sit to/from Stand Sit to Stand: Supervision         General transfer comment: for safety, management of lines/leads. No physical assist provided  Ambulation/Gait Ambulation/Gait assistance: Min guard Gait Distance (Feet): 140 Feet Assistive device: Rolling walker (2 wheeled) Gait Pattern/deviations: Step-through pattern;Decreased stride length Gait velocity: slightly decr   General Gait Details: min guard for safety; cues for deep breathing/lung expansion as able; cues to return to room for rest break.  Pt was on 6-7 L O2 mask (states mouth breather prefers mask) at rest wtih sats 95%, when returned to room sats were 76% and slow to rise.  Increased to 8 L and sats back to 93% in 3 mins.  Able to decrease back to 7 L sats 91%.  Notified RN.   Stairs              Wheelchair Mobility    Modified Rankin (Stroke Patients Only)       Balance Overall balance assessment: Needs assistance   Sitting balance-Leahy Scale: Normal       Standing balance-Leahy Scale: Fair Standing balance comment: RW for ambulation; static stand for ADL no AD                            Cognition Arousal/Alertness: Awake/alert Behavior During Therapy: WFL for tasks assessed/performed Overall Cognitive Status: Within Functional Limits for tasks assessed                                 General Comments: very HOH, at times requires repeated cues only due to this      Exercises      General Comments        Pertinent Vitals/Pain Pain Assessment: No/denies pain    Home Living                      Prior Function            PT Goals (current goals can now be found in the care plan section) Acute Rehab PT Goals Patient Stated Goal: feel better, back to golf PT Goal Formulation: With patient Time For Goal Achievement: 12/13/20 Potential to Achieve Goals: Good Progress  towards PT goals: Progressing toward goals    Frequency    Min 3X/week      PT Plan Current plan remains appropriate    Co-evaluation              AM-PAC PT "6 Clicks" Mobility   Outcome Measure  Help needed turning from your back to your side while in a flat bed without using bedrails?: None Help needed moving from lying on your back to sitting on the side of a flat bed without using bedrails?: None Help needed moving to and from a bed to a chair (including a wheelchair)?: A Little Help needed standing up from a chair using your arms (e.g., wheelchair or bedside chair)?: A Little Help needed to walk in hospital room?: A Little Help needed climbing 3-5 steps with a railing? : A Little 6 Click Score: 20    End of Session Equipment Utilized During Treatment: Oxygen Activity Tolerance: Patient tolerated treatment well Patient  left: in chair;with call bell/phone within reach (pt has been standing to use urinal independently so left alarm off; follows commands) Nurse Communication: Mobility status PT Visit Diagnosis: Other abnormalities of gait and mobility (R26.89);Difficulty in walking, not elsewhere classified (R26.2)     Time: 1540-1606 PT Time Calculation (min) (ACUTE ONLY): 26 min  Charges:  $Gait Training: 8-22 mins $Therapeutic Activity: 8-22 mins                     Abran Richard, PT Acute Rehab Services Pager [phone] [facility] [place] 11/30/2020, 4:40 PM

## 2020-11-30 NOTE — Progress Notes (Signed)
Patient's RR was 34, making him a yellow MEWS, protocol initiated and scheduled nebulizer treatment was given, currently RR is 25 and he is on 7L NRB. NP Blount was notified.

## 2020-11-30 NOTE — Progress Notes (Signed)
PROGRESS NOTE  Geoffrey Wood  YKD:[id number] DOB: 1946-08-17 DOA: 11/28/2020 PCP: Crist Infante, MD  Outpatient Specialists: Oncology, Dr. Irene Limbo; ENT, Dr. Lucia Gaskins; Pulmonary, Dr. Vaughan Browner Brief Narrative: Geoffrey Wood is a 75 y.o. male with a history of severe emphysema, IPF recently started on 4L O2 at home, [facility] of right parotid s/p excision and radiation with chronic wound, ITP, HTN, and hard of hearing who presented to the ED late 4/14 for progressive shortness of breath and hypoxia to 70-80%'s despite starting 4L O2 at home. He was confirmed to be hypoxic, due to mouth breathing, placed on simple mask with improved oxygenation. CXR demonstrated chronic interstial lung disease/fibrosis which appeared stable from prior HRCT. Subsequent CTA chest revealed extensive bibasilar infiltrate/consolidations with small right parapneumonic effusion with extensive mediastinal adenopathy. No PE, severe emphysema. Antibiotics were given and patient admitted. Swallow evaluation is pending. He remains on 5L O2.  Assessment & Plan: Active Problems:   HTN (hypertension)   Hyperlipidemia   Coronary artery calcification   Immune thrombocytopenia (HCC)   IPF (idiopathic pulmonary fibrosis) (HCC)   COPD exacerbation (HCC)   CAP (community acquired pneumonia)   Cellulitis   Protein-calorie malnutrition, severe  Acute on chronic hypoxic respiratory failure: Due to bibasilar pneumonia +/- COPD exacerbation, pleural effusion on baseline severe emphysema, IPF. No PE on CTA. Covid and flu negative.  - Continue typical abx (CTX, azithromycin) for now. PCT reassuring, WBC normal.  - Getting MBSS w/hx radiation, possible aspiration.  - Monitor blood, sputum cultures, urine Ag's, and broader viral respiratory panel (some preceding URI symptoms) - Holding steroids due to infection and no wheezing on exam currently. - Continue bronchodilators scheduled duoneb and prn albuterol - BNP grossly elevated with cardiomegaly,  particularly RV enlargement. Echo shows RV enlargement and elevated right-sided pressures consistent with developing cor pulmonale. normal LV. - Repeat CT recommended in 3 months to confirm resolution of infiltrates, effusion, and adenopathy.   IPF, COPD: Followed by pulmonary, Dr. Vaughan Browner. Held off initiation of treatment at Feb visit with plans to follow up in 6 months. - Will discuss with pulmonary, as this patient has severe mixed obstructive and restrictive disease, is likely to require high levels of supplemental oxygen and may benefit from additional input.  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  - Follow up with ENT as scheduled 4/20 - Has CT soft tissue head/neck scheduled 4/21 with follow up with [name] 4/22.  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 This has not changed and he's told it's unlikely to ever close up. Has continued wound care by his wife diligently.  - Has follow up with Dr. Lucia Gaskins on 4/20. Will defer further management to him. Do not feel this requires targeted treatment for cellulitis at this time.  Severe protein-calorie malnutrition:  - Supplement protein as much as possible. Medical record indicates a feeding tube is being considered.  - We will consult palliative care for assistance with progressive and severe pulmonary disease, malnutrition and poorly healing wound.  ITP, chronic iron-deficiency anemia, AOCD: Follows with Dr. Irene Limbo.  - Platelets at baseline, but low enough to preclude pharmacologic VTE ppx. - Hgb near baseline, continue po iron.  Hyperbilirubinemia: Mild, has history of same. Hgb is stable.   Extensive multivessel CAD, PVD, HLD: Noted on CTA. TWI, ST depressions on ECG. - Troponin reassuring  x2, no chest pain. LVEF 57-01%, normal diastolic parameters and wall  motion. RV enlarged with elevated PASP and RA pressure, moderate biatrial enlargement.   Ascending aortic aneurysm: 4.2cm on CTA. Also s/p AAA repair 2004. - Follow up CTA or MRA recommended annually.  T2DM: HbA1c 5.5% consistent with good/too tight of glycemic control, though no reports of hypoglycemic symptoms/episodes. - SSI  DVT prophylaxis: SCD Code Status: Full Family Communication: None at bedside Disposition Plan:  Status is: Inpatient  Remains inpatient appropriate because:Inpatient level of care appropriate due to severity of illness  Dispo: The patient is from: Home              Anticipated d/c is to: Home              Patient currently is not medically stable to d/c.   Difficult to place patient No  Consultants:   Pulmonary  Palliative  Procedures:  Echo 11/29/2020  Antimicrobials:  Vancomycin  4/14  Ceftriaxone, azithromycin 4/14 >>   Subjective: Still moderately short of breath at rest, more so with exertion. No chest pain. Stable leg swelling. Headed for MBSS. No other changes.   Objective: Vitals:   11/29/20 2006 11/29/20 2050 11/30/20 0420 11/30/20 0801  BP: 95/61  99/61   Pulse: 64  69   Resp: 18  20   Temp: (!) 97.4 F (36.3 C)  (!) 97.4 F (36.3 C)   TempSrc: Oral  Oral   SpO2: 95% 95% 90% 91%    Intake/Output Summary (Last 24 hours) at 11/30/2020 1040 Last data filed at 11/30/2020 1003 Gross per 24 hour  Intake 236 ml  Output 800 ml  Net -564 ml   Gen: 75 y.o. male in no distress, frail Pulm: Non-labored breathing 5L by mask. Crackles without wheezes bilaterally CV: Regular rate and rhythm. No murmur, rub, or gallop. No JVD, trace pitting symmetric pedal edema. GI: Abdomen soft, non-tender, non-distended, with normoactive bowel sounds. No organomegaly or masses felt. Ext: Warm, no deformities Skin: No rashes, lesions or ulcers on visualized skin Neuro: Alert and oriented. Remains severe HOH. No focal neurological deficits. Psych:  Judgement and insight appear normal. Mood & affect appropriate.   Data Reviewed: I have personally reviewed following labs and imaging studies  CBC: Recent Labs  Lab 11/28/20 2149 11/28/20 2204 11/29/20 0103  WBC 6.1  --  6.3  NEUTROABS 3.4  --  3.9  HGB 9.6* 9.2* 9.0*  HCT 30.9* 27.0* 29.1*  MCV 81.3  --  81.3  PLT 82*  --  78*   Basic Metabolic Panel: Recent Labs  Lab 11/28/20 2149 11/28/20 2204 11/29/20 0103  NA 134* 136 131*  K 4.1 4.2 4.1  CL 103 102 103  CO2 23  --  20*  GLUCOSE 212* 203* 197*  BUN 24* 23 21  CREATININE 1.05 0.90 0.90  CALCIUM 8.4*  --  8.2*  MG 2.1  --   --    GFR: Estimated Creatinine Clearance: 63.4 mL/min (by C-G formula based on SCr of 0.9 mg/dL). Liver Function Tests: Recent Labs  Lab 11/28/20 2149 11/29/20 0103  AST 27 24  ALT 27 25  ALKPHOS 89 84  BILITOT 1.8* 1.8*  PROT 7.2 7.1  ALBUMIN 2.5* 2.5*   No results for input(s): LIPASE, AMYLASE in the last 168 hours. No results for input(s): AMMONIA in the last 168 hours. Coagulation Profile: No results for input(s): INR, PROTIME in the last 168 hours. Cardiac Enzymes: No results for input(s):  CKTOTAL, CKMB, CKMBINDEX, TROPONINI in the last 168 hours. BNP (last 3 results) No results for input(s): PROBNP in the last 8760 hours. HbA1C: Recent Labs    11/29/20 0103  HGBA1C 5.5   CBG: Recent Labs  Lab 11/29/20 1704 11/29/20 2008 11/29/20 2358 11/30/20 0424 11/30/20 0717  GLUCAP 247* 214* 149* 164* 148*   Lipid Profile: Recent Labs    11/29/20 0154  CHOL 87  HDL 18*  LDLCALC 59  TRIG 52  CHOLHDL 4.8   Thyroid Function Tests: No results for input(s): TSH, T4TOTAL, FREET4, T3FREE, THYROIDAB in the last 72 hours. Anemia Panel: No results for input(s): VITAMINB12, FOLATE, FERRITIN, TIBC, IRON, RETICCTPCT in the last 72 hours. Urine analysis:    Component Value Date/Time   COLORURINE AMBER (A) 11/29/2020 0026   APPEARANCEUR CLEAR 11/29/2020 0026   LABSPEC >1.046  (H) 11/29/2020 0026   PHURINE 6.0 11/29/2020 0026   GLUCOSEU NEGATIVE 11/29/2020 0026   HGBUR NEGATIVE 11/29/2020 0026   BILIRUBINUR NEGATIVE 11/29/2020 0026   KETONESUR NEGATIVE 11/29/2020 0026   PROTEINUR NEGATIVE 11/29/2020 0026   NITRITE NEGATIVE 11/29/2020 0026   LEUKOCYTESUR NEGATIVE 11/29/2020 0026   Recent Results (from the past 240 hour(s))  Resp Panel by RT-PCR (Flu A&B, Covid) Nasopharyngeal Swab     Status: None   Collection Time: 11/28/20 10:03 PM   Specimen: Nasopharyngeal Swab; Nasopharyngeal(NP) swabs in vial transport medium  Result Value Ref Range Status   SARS Coronavirus 2 by RT PCR NEGATIVE NEGATIVE Final    Comment: (NOTE) SARS-CoV-2 target nucleic acids are NOT DETECTED.  The SARS-CoV-2 RNA is generally detectable in upper respiratory specimens during the acute phase of infection. The lowest concentration of SARS-CoV-2 viral copies this assay can detect is 138 copies/mL. A negative result does not preclude SARS-Cov-2 infection and should not be used as the sole basis for treatment or other patient management decisions. A negative result may occur with  improper specimen collection/handling, submission of specimen other than nasopharyngeal swab, presence of viral mutation(s) within the areas targeted by this assay, and inadequate number of viral copies(<138 copies/mL). A negative result must be combined with clinical observations, patient history, and epidemiological information. The expected result is Negative.  Fact Sheet for Patients:  EntrepreneurPulse.com.au  Fact Sheet for Healthcare Providers:  IncredibleEmployment.be  This test is no t yet approved or cleared by the [place] FDA and  has been authorized for detection and/or diagnosis of SARS-CoV-2 by FDA under an Emergency Use Authorization (EUA). This EUA will remain  in effect (meaning this test can be used) for the duration of the COVID-19 declaration  under Section 564(b)(1) of the Act, 21 U.S.C.section 360bbb-3(b)(1), unless the authorization is terminated  or revoked sooner.       Influenza A by PCR NEGATIVE NEGATIVE Final   Influenza B by PCR NEGATIVE NEGATIVE Final    Comment: (NOTE) The Xpert Xpress SARS-CoV-2/FLU/RSV plus assay is intended as an aid in the diagnosis of influenza from Nasopharyngeal swab specimens and should not be used as a sole basis for treatment. Nasal washings and aspirates are unacceptable for Xpert Xpress SARS-CoV-2/FLU/RSV testing.  Fact Sheet for Patients: EntrepreneurPulse.com.au  Fact Sheet for Healthcare Providers: IncredibleEmployment.be  This test is not yet approved or cleared by the [place] FDA and has been authorized for detection and/or diagnosis of SARS-CoV-2 by FDA under an Emergency Use Authorization (EUA). This EUA will remain in effect (meaning this test can be used) for the duration of the COVID-19 declaration under  Section 564(b)(1) of the Act, 21 U.S.C. section 360bbb-3(b)(1), unless the authorization is terminated or revoked.  Performed at [facility], [place] [address] [place], [place] [address]       Radiology Studies: CT Angio Chest PE W and/or Wo Contrast  Result Date: 11/28/2020 CLINICAL DATA:  Dyspnea EXAM: CT ANGIOGRAPHY CHEST WITH CONTRAST TECHNIQUE: Multidetector CT imaging of the chest was performed using the standard protocol during bolus administration of intravenous contrast. Multiplanar CT image reconstructions and MIPs were obtained to evaluate the vascular anatomy. CONTRAST:  13mL OMNIPAQUE IOHEXOL 350 MG/ML SOLN COMPARISON:  09/26/2020 FINDINGS: Cardiovascular: There is adequate opacification of the pulmonary arterial tree. There is no intraluminal filling defect identified to suggest acute pulmonary embolism. The central pulmonary arteries are of normal caliber. Mild atherosclerotic calcification  within the thoracic aorta. The thoracic aorta is dilated, measuring 4.2 x 3.9 cm in its ascending segment (coronal # 55/sagittal # 85) and 3.8 x 3.4 cm just beyond the takeoff of the left subclavian vein within the aortic arch (axial # 44/sagittal # 96). At the level of the diaphragmatic hiatus, the descending thoracic aorta measures 3.2 cm. Extensive multi-vessel coronary artery calcification. Mild global cardiomegaly with particular enlargement of the a right ventricle. No pericardial effusion. Mediastinum/Nodes: There has developed extensive bilateral hilar and mediastinal adenopathy, possibly reactive in nature given its relatively rapid development since prior examination. Index right hilar lymph node measures 2.3 cm at axial image # 70 and left hilar lymph node measures 1.5 cm at axial image # 74. The visualized thyroid is unremarkable. The esophagus is unremarkable. Lungs/Pleura: Severe centrilobular and paraseptal emphysema, more severe within the apices bilaterally. Since the prior examination, there has developed extensive bibasilar ground-glass pulmonary infiltrate and pulmonary consolidation, more severe within the right lower lobe. Small right pleural effusion has developed. Together, the findings are suspicious for atypical infection, aspiration, or less commonly, drug reaction. Small probable associated right parapneumonic effusion. No pneumothorax. The central airways are widely patent. Upper Abdomen: No acute abnormality. Musculoskeletal: No acute bone abnormality. No suspicious lytic or blastic bone lesion. Review of the MIP images confirms the above findings. IMPRESSION: No pulmonary embolism. Severe centrilobular emphysema. Interval development of extensive bibasilar pulmonary infiltrate and consolidation most in keeping with atypical infection in the acute setting with small associated right parapneumonic effusion. Less commonly, this may be seen with aspiration or drug reaction. Extensive  mediastinal adenopathy may be reactive in nature given its relatively rapid development. Follow-up imaging in 3 months, once the patient acute issues have resolved, would be helpful in documenting resolution. Extensive multi-vessel coronary artery calcification. Mild cardiomegaly with particular enlargement of the right ventricle. Dilation of the thoracic aorta with maximal dimension of 4.2 cm within the ascending segment. Recommend annual imaging followup by CTA or MRA. This recommendation follows 2010 ACCF/AHA/AATS/ACR/ASA/SCA/SCAI/SIR/STS/SVM Guidelines for the Diagnosis and Management of Patients with Thoracic Aortic Disease. Circulation. 2010; 121: U981-X914. Aortic aneurysm NOS (ICD10-I71.9) Aortic aneurysm NOS (ICD10-I71.9). Aortic Atherosclerosis (ICD10-I70.0) and Emphysema (ICD10-J43.9). Electronically Signed   By: Fidela Salisbury MD   On: 11/28/2020 23:19   DG Chest Port 1 View  Result Date: 11/28/2020 CLINICAL DATA:  Chronic oxygen dependent patient presenting with a 1 week history of progressively worsening shortness of breath and hypoxemia (oxygen saturation in the 70s and 80s). Current history of UIP/pulmonary fibrosis. EXAM: PORTABLE CHEST 1 VIEW COMPARISON:  Chest x-ray 07/15/2020. High-resolution CT chest 09/26/2020. FINDINGS: Cardiac silhouette upper normal in size for AP portable technique. Interstitial opacities  throughout both lungs which are not felt to be significantly changed since the high-resolution CT. More confluent opacity peripherally in the mid RIGHT lung was shown on the CT to represent scarring. IMPRESSION: Chronic interstitial lung disease/pulmonary fibrosis. No convincing evidence of acute cardiopulmonary disease. Electronically Signed   By: Evangeline Dakin M.D.   On: 11/28/2020 21:55   ECHOCARDIOGRAM COMPLETE  Result Date: 11/29/2020    ECHOCARDIOGRAM REPORT   Patient Name:   Geoffrey Wood Date of Exam: 11/29/2020 Medical Rec #:  [id number]       Height:       72.0 in  Accession #:    [id number]      Weight:       143.8 lb Date of Birth:  [birth year]        BSA:          1.852 m Patient Age:    2 years        BP:           94/54 mmHg Patient Gender: M               HR:           65 bpm. Exam Location:  Inpatient Procedure: 2D Echo, Cardiac Doppler and Color Doppler Indications:    COPD  History:        Patient has prior history of Echocardiogram examinations, most                 recent 08/26/2017. Risk Factors:Dyslipidemia, Hypertension and                 Diabetes.  Sonographer:    Luisa Hart RDCS Referring Phys: [place]  1. Left ventricular ejection fraction, by estimation, is 65 to 70%. The left ventricle has normal function. The left ventricle has no regional wall motion abnormalities. Left ventricular diastolic parameters were normal.  2. Right ventricular systolic function is low normal. The right ventricular size is moderately enlarged. There is mildly elevated pulmonary artery systolic pressure.  3. Left atrial size was mild to moderately dilated.  4. Right atrial size was mild to moderately dilated.  5. The mitral valve is grossly normal. Trivial mitral valve regurgitation.  6. The aortic valve is tricuspid. There is mild calcification of the aortic valve. There is mild thickening of the aortic valve. Aortic valve regurgitation is not visualized. Mild aortic valve sclerosis is present, with no evidence of aortic valve stenosis.  7. The inferior vena cava is dilated in size with <50% respiratory variability, suggesting right atrial pressure of 15 mmHg. Comparison(s): Prior images unable to be directly viewed, comparison made by report only. Conclusion(s)/Recommendation(s): RV moderately enlarged, with mildly elevated RVSP. Normal LVEF. FINDINGS  Left Ventricle: Left ventricular ejection fraction, by estimation, is 65 to 70%. The left ventricle has normal function. The left ventricle has no regional wall motion abnormalities. The left  ventricular internal cavity size was normal in size. There is  borderline left ventricular hypertrophy. Left ventricular diastolic parameters were normal. Right Ventricle: The right ventricular size is moderately enlarged. Right vetricular wall thickness was not well visualized. Right ventricular systolic function is low normal. There is mildly elevated pulmonary artery systolic pressure. The tricuspid regurgitant velocity is 2.57 m/s, and with an assumed right atrial pressure of 15 mmHg, the estimated right ventricular systolic pressure is 74.1 mmHg. Left Atrium: Left atrial size was mild to moderately dilated. Right Atrium: Right atrial size was mild to moderately dilated.  Pericardium: There is no evidence of pericardial effusion. Mitral Valve: The mitral valve is grossly normal. Trivial mitral valve regurgitation. Tricuspid Valve: The tricuspid valve is normal in structure. Tricuspid valve regurgitation is mild. Aortic Valve: The aortic valve is tricuspid. There is mild calcification of the aortic valve. There is mild thickening of the aortic valve. Aortic valve regurgitation is not visualized. Mild aortic valve sclerosis is present, with no evidence of aortic valve stenosis. Aortic valve mean gradient measures 6.0 mmHg. Aortic valve peak gradient measures 11.8 mmHg. Aortic valve area, by VTI measures 2.51 cm. Pulmonic Valve: The pulmonic valve was grossly normal. Pulmonic valve regurgitation is not visualized. No evidence of pulmonic stenosis. Aorta: The aortic root and ascending aorta are structurally normal, with no evidence of dilitation. Venous: The inferior vena cava is dilated in size with less than 50% respiratory variability, suggesting right atrial pressure of 15 mmHg. IAS/Shunts: The atrial septum is grossly normal.  LEFT VENTRICLE PLAX 2D LVIDd:         4.66 cm     Diastology LVIDs:         2.88 cm     LV e' medial:    7.72 cm/s LV PW:         1.20 cm     LV E/e' medial:  6.0 LV IVS:        0.70 cm      LV e' lateral:   10.40 cm/s LVOT diam:     2.60 cm     LV E/e' lateral: 4.5 LV SV:         100 LV SV Index:   54 LVOT Area:     5.31 cm  LV Volumes (MOD) LV vol d, MOD A2C: 85.5 ml LV vol s, MOD A2C: 28.9 ml LV SV MOD A2C:     56.6 ml RIGHT VENTRICLE RV S prime:     10.10 cm/s  PULMONARY VEINS TAPSE (M-mode): 1.6 cm      A Reversal Duration: 77.00 msec                             A Reversal Velocity: 16.80 cm/s                             Diastolic Velocity:  88.89 cm/s                             S/D Velocity:        1.40                             Systolic Velocity:   16.94 cm/s AORTIC VALVE                    PULMONIC VALVE AV Area (Vmax):    2.58 cm     PV Vmax:       0.94 m/s AV Area (Vmean):   2.81 cm     PV Vmean:      79.900 cm/s AV Area (VTI):     2.51 cm     PV VTI:        0.263 m AV Vmax:           172.00 cm/s  PV Peak grad:  3.5 mmHg AV Vmean:  113.000 cm/s PV Mean grad:  3.0 mmHg AV VTI:            0.400 m AV Peak Grad:      11.8 mmHg AV Mean Grad:      6.0 mmHg LVOT Vmax:         83.50 cm/s LVOT Vmean:        59.900 cm/s LVOT VTI:          0.189 m LVOT/AV VTI ratio: 0.47  AORTA Ao Root diam: 4.00 cm Ao Asc diam:  3.70 cm MITRAL VALVE               TRICUSPID VALVE MV Area (PHT): 2.50 cm    TR Peak grad:   26.4 mmHg MV Decel Time: 303 msec    TR Vmax:        257.00 cm/s MV E velocity: 46.70 cm/s MV A velocity: 83.90 cm/s  SHUNTS MV E/A ratio:  0.56        Systemic VTI:  0.19 m                            Systemic Diam: 2.60 cm Buford Dresser MD Electronically signed by Buford Dresser MD Signature Date/Time: 11/29/2020/6:11:02 PM    Final     Scheduled Meds: . feeding supplement ([employer] STANDARD 1.4)  325 mL Oral TID BM  . insulin aspart  0-9 Units Subcutaneous Q4H  . ipratropium-albuterol  3 mL Nebulization TID  . multivitamin with minerals  1 tablet Oral Daily  . protein supplement  1 Scoop Oral TID WC  . sodium chloride flush  3 mL Intravenous Q12H    Continuous Infusions: . azithromycin 500 mg (11/30/20 0015)  . cefTRIAXone (ROCEPHIN)  IV 2 g (11/30/20 0100)     LOS: 1 day   Time spent: 35 minutes.  Patrecia Pour, MD [facility] [email or web address] 11/30/2020, 10:40 AM

## 2020-11-30 NOTE — Progress Notes (Signed)
SATURATION QUALIFICATIONS: (This note is used to comply with regulatory documentation for home oxygen)  Patient Saturations on 6-7L at Rest = 95%  Patient Saturations on 6-7 while Ambulating = 76%  Patient Saturations on 8 Liters of oxygen while Ambulating = 96%  Please briefly explain why patient needs home oxygen:Patient desats with minimal movement. Patient will benefit from home oxygen.

## 2020-11-30 NOTE — Consult Note (Signed)
NAME:  Geoffrey Wood, MRN:  [id number], DOB:  [birth year], LOS: 1 ADMISSION DATE:  11/28/2020, CONSULTATION DATE:  11/30/20 REFERRING MD:  Geoffrey Gather, MD CHIEF COMPLAINT:  New oxygen requirement  History of Present Illness:  [name] is a 75 year old male with below medical history who was admitted for acute hypoxemic respiratory failure secondary to presumed aspiration pneumonia.  He is seen by Dr. 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 Not a candidate for antifibrotics at that time due to ongoing weight loss and co-morbidities. Since then he has had worsening shortness of breath and on day of admission he called his primary care office. He was initiated on home oxygen 4L via [place] however he became hypoxemic to 70-80% and presented to the ED on the same day the oxygen was delivered. He denies any preceding fevers, chills, cough, choking, difficulty swallowing prior to this. He was admitted to [facility] and started on antibiotics, nebulizers and oxygen. Swallow/modified barium evaluation demonstrated moderate dysphagia and patient was started on dysphagia diet.  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 4/14 Admitted to [facility] for acute on chronic hypoxemic respiratory failure . 4/16 PCCM consulted for recommendations  Interim History / Subjective:  Reports shortness of breath and prefers face mask due to mouth breathing. He is better but does not feel well enough to go home  Objective   Blood pressure (!) 97/53, pulse 81, temperature 97.6 F (36.4 C), temperature source Oral, resp. rate 20, SpO2 97 %.         Intake/Output Summary (Last 24 hours) at 11/30/2020 1356 Last data filed at 11/30/2020 1003 Gross per 24 hour  Intake 236 ml  Output 800 ml  Net -564 ml   There were no vitals filed for this visit.   Physical Exam: General: Elderly, frail, thin chronically-ill appearing, no acute distress HENT: [place], AT, OP clear, MMM Eyes: EOMI, no scleral icterus Respiratory: Bibasilar crackles, R>L Cardiovascular: RRR, -M/R/G, no JVD GI: BS+, soft, nontender Extremities:-Edema,-tenderness Neuro: AAO x4, CNII-XII grossly intact Skin: Intact, no rashes or bruising Psych: Normal mood, normal affect   Labs/imaging that I havepersonally reviewed  (right click and "Reselect all SmartList Selections" daily)   CTA 11/28/20 - No pulmonary embolism. Severe centrilobular and paraseptal emphysema. Interval development of right pleural effusion and bibasilar ground glass opacities. Reactive mediastinal lymphadenopathy  TTE 11/29/20 EF 65-70%. No WMA. Biatrial enlargement, moderately enlarged RV  PFT 09/30/20 FVC 2.62 (60%) FEV1 2.56 (81%) Ratio 88  TLC 83% RV 129% RV/TLC 150% DLCO 26% Interpretation: No evidence of obstructive on spirometry however reduced FVC. Increased RV and RV/TLC suggests air trapping. Very severely reduced DLCO may suggests need for supplemental oxygen if clinically indicated.  BNP 913  WBC 6.1 Hg 9.2  Resolved Hospital Problem list     Assessment & Plan:   Acute hypoxemic respiratory failure Severe emphysema with co-comitant IPF Right pleural effusion Patient with poor baseline lung function susceptible to oxygen requirement in the acute setting. Oxygenation was normal in the outpatient setting in 3/2/2 when seen by ENT. His new oxygen requirement is likely related to aspiration +/- ILD flare. Would start  steroid taper to be followed by Pulmonary after discharge  - Wean supplemental O2 for goal 88-92% - Complete 5-7 day course of community-acquired pneumonia. De-escalate  to Augmentin prior to discharge - Recommend gentle diureses. Lasix 40 mg given once - Continue scheduled duonebs while inpatient. Transition to home Anoro prior to discharge - Start PPI to minimize acid aspiration - Start prednisone 60 mg (1mg /kg). Decrease by 10 mg weekly until seen in outpatient Pulmonary clinic. Staff message sent.  Pulmonary will intermittently follow  Best practice (right click and "Reselect all SmartList Selections" daily)  Diet:  Oral Pain/Anxiety/Delirium protocol (if indicated): No VAP protocol (if indicated): Not indicated DVT prophylaxis: Per primary team GI prophylaxis: PPI Glucose control:  SSI Yes Central venous access:  N/A Arterial line:  N/A Foley:  N/A Mobility:  OOB  PT consulted: N/A Last date of multidisciplinary goals of care discussion []  Per primary Code Status:  full code Disposition: Per [facility]  Labs   CBC: Recent Labs  Lab 11/28/20 2149 11/28/20 2204 11/29/20 0103  WBC 6.1  --  6.3  NEUTROABS 3.4  --  3.9  HGB 9.6* 9.2* 9.0*  HCT 30.9* 27.0* 29.1*  MCV 81.3  --  81.3  PLT 82*  --  78*    Basic Metabolic Panel: Recent Labs  Lab 11/28/20 2149 11/28/20 2204 11/29/20 0103  NA 134* 136 131*  K 4.1 4.2 4.1  CL 103 102 103  CO2 23  --  20*  GLUCOSE 212* 203* 197*  BUN 24* 23 21  CREATININE 1.05 0.90 0.90  CALCIUM 8.4*  --  8.2*  MG 2.1  --   --    GFR: Estimated Creatinine Clearance: 63.4 mL/min (by C-G formula based on SCr of 0.9 mg/dL). Recent Labs  Lab 11/28/20 2149 11/29/20 0103 11/29/20 0154  PROCALCITON 0.14 0.14  --   WBC 6.1 6.3  --   LATICACIDVEN  --   --  0.9    Liver Function Tests: Recent Labs  Lab 11/28/20 2149 11/29/20 0103  AST 27 24  ALT 27 25  ALKPHOS 89 84  BILITOT 1.8* 1.8*  PROT 7.2 7.1  ALBUMIN 2.5* 2.5*   No results for input(s): LIPASE, AMYLASE in the last 168 hours. No results for input(s): AMMONIA in the last 168 hours.  ABG    Component Value Date/Time   HCO3 25.1  11/28/2020 2149   TCO2 23 11/28/2020 2204   O2SAT 45.6 11/28/2020 2149     Coagulation Profile: No results for input(s): INR, PROTIME in the last 168 hours.  Cardiac Enzymes: No results for input(s): CKTOTAL, CKMB, CKMBINDEX, TROPONINI in the last 168 hours.  HbA1C: Hgb A1c MFr Bld  Date/Time Value Ref Range Status  11/29/2020 01:03 AM 5.5 4.8 - 5.6 % Final    Comment:    (NOTE) Pre diabetes:          5.7%-6.4%  Diabetes:              >6.4%  Glycemic control for   <7.0% adults with diabetes     CBG: Recent Labs  Lab 11/29/20 2008 11/29/20 2358 11/30/20 0424 11/30/20 0717 11/30/20 1129  GLUCAP 214* 149* 164* 148* 225*    Review of Systems:   Reports shortness of breath. Denies wheeze, cough, fevers, chills. Denies chest pain. Denies dysphagia, nausea, vomiting, abdominal pain.  Past Medical History:  He,  has a past medical history of Adenomatous colon polyp, Cataract, Diabetes mellitus, Emphysema, Emphysema of lung ([place]),  Erectile dysfunction, Hyperlipidemia, Hypertension, Peripheral vascular disease ([place]), Pneumonia, and Pulmonary nodule.   Surgical History:   Past Surgical History:  Procedure Laterality Date  . ABDOMINAL AORTIC ANEURYSM REPAIR  2004  . COLONOSCOPY  multiple since 2004     Social History:   reports that he quit smoking about 6 years ago. His smoking use included cigarettes. He has a 90.00 pack-year smoking history. He has never used smokeless tobacco. He reports current alcohol use. He reports that he does not use drugs.   Family History:  His family history includes Cancer in his father; Diabetes in his mother; Heart failure in his mother. There is no history of Colon cancer, Esophageal cancer, Liver cancer, Pancreatic cancer, Rectal cancer, or Stomach cancer.   Allergies No Known Allergies   Home Medications  Prior to Admission medications   Medication Sig Start Date End Date Taking? Authorizing Provider  acetaminophen (TYLENOL) 325  MG tablet Take 325 mg by mouth every 6 (six) hours as needed for mild pain.   Yes [provider]  ANORO ELLIPTA 62.5-25 MCG/INH AEPB Take 1 puff by mouth daily. 08/20/17  Yes [provider]  Ascorbic Acid (VITAMIN C) 500 MG CAPS Take 1 capsule by mouth 2 (two) times daily.   Yes [provider]  Cholecalciferol (VITAMIN D PO) Take 800 Units by mouth daily.   Yes [provider]  Cyanocobalamin (VITAMIN B 12 PO) Take 500 mcg by mouth daily.   Yes [provider]  mirtazapine (REMERON) 15 MG tablet Take 45 mg by mouth at bedtime. 09/03/20  Yes [provider]  b complex vitamins capsule Take 1 capsule by mouth daily.    [provider]  Roma Schanz test strip  07/12/18   [provider]     Critical care time: N/A    Rodman Pickle, M.D. [facility] Pulmonary/Critical Care Medicine 11/30/2020 1:57 PM

## 2020-12-01 ENCOUNTER — Other Ambulatory Visit: Payer: Self-pay | Admitting: Hematology and Oncology

## 2020-12-01 DIAGNOSIS — R0602 Shortness of breath: Secondary | ICD-10-CM

## 2020-12-01 DIAGNOSIS — Z7189 Other specified counseling: Secondary | ICD-10-CM

## 2020-12-01 DIAGNOSIS — I2510 Atherosclerotic heart disease of native coronary artery without angina pectoris: Secondary | ICD-10-CM | POA: Diagnosis not present

## 2020-12-01 DIAGNOSIS — D61818 Other pancytopenia: Secondary | ICD-10-CM | POA: Diagnosis not present

## 2020-12-01 DIAGNOSIS — R040 Epistaxis: Secondary | ICD-10-CM

## 2020-12-01 DIAGNOSIS — J189 Pneumonia, unspecified organism: Secondary | ICD-10-CM | POA: Diagnosis not present

## 2020-12-01 DIAGNOSIS — R161 Splenomegaly, not elsewhere classified: Secondary | ICD-10-CM

## 2020-12-01 DIAGNOSIS — R531 Weakness: Secondary | ICD-10-CM | POA: Diagnosis not present

## 2020-12-01 DIAGNOSIS — I10 Essential (primary) hypertension: Secondary | ICD-10-CM | POA: Diagnosis not present

## 2020-12-01 DIAGNOSIS — Z85818 Personal history of malignant neoplasm of other sites of lip, oral cavity, and pharynx: Secondary | ICD-10-CM

## 2020-12-01 DIAGNOSIS — Z515 Encounter for palliative care: Secondary | ICD-10-CM

## 2020-12-01 DIAGNOSIS — E119 Type 2 diabetes mellitus without complications: Secondary | ICD-10-CM

## 2020-12-01 DIAGNOSIS — J441 Chronic obstructive pulmonary disease with (acute) exacerbation: Secondary | ICD-10-CM | POA: Diagnosis not present

## 2020-12-01 LAB — COMPREHENSIVE METABOLIC PANEL
ALT: 26 U/L (ref 0–44)
AST: 24 U/L (ref 15–41)
Albumin: 2.2 g/dL — ABNORMAL LOW (ref 3.5–5.0)
Alkaline Phosphatase: 71 U/L (ref 38–126)
Anion gap: 8 (ref 5–15)
BUN: 18 mg/dL (ref 8–23)
CO2: 25 mmol/L (ref 22–32)
Calcium: 7.9 mg/dL — ABNORMAL LOW (ref 8.9–10.3)
Chloride: 102 mmol/L (ref 98–111)
Creatinine, Ser: 0.75 mg/dL (ref 0.61–1.24)
GFR, Estimated: >60 mL/min (ref >60)
Glucose, Bld: 213 mg/dL — ABNORMAL HIGH (ref 70–99)
Potassium: 3.7 mmol/L (ref 3.5–5.1)
Sodium: 135 mmol/L (ref 135–145)
Total Bilirubin: 0.7 mg/dL (ref 0.3–1.2)
Total Protein: 6.3 g/dL — ABNORMAL LOW (ref 6.5–8.1)

## 2020-12-01 LAB — CBC
HCT: 28 % — ABNORMAL LOW (ref 39.0–52.0)
Hemoglobin: 8.4 g/dL — ABNORMAL LOW (ref 13.0–17.0)
MCH: 24.7 pg — ABNORMAL LOW (ref 26.0–34.0)
MCHC: 30 g/dL (ref 30.0–36.0)
MCV: 82.4 fL (ref 80.0–100.0)
Platelets: 63 10*3/uL — ABNORMAL LOW (ref 150–400)
RBC: 3.4 MIL/uL — ABNORMAL LOW (ref 4.22–5.81)
RDW: 28.3 % — ABNORMAL HIGH (ref 11.5–15.5)
WBC: 3 10*3/uL — ABNORMAL LOW (ref 4.0–10.5)
nRBC: 29.4 % — ABNORMAL HIGH (ref 0.0–0.2)

## 2020-12-01 LAB — GLUCOSE, RANDOM: Glucose, Bld: 320 mg/dL — ABNORMAL HIGH (ref 70–99)

## 2020-12-01 LAB — GLUCOSE, CAPILLARY
Glucose-Capillary: 266 mg/dL — ABNORMAL HIGH (ref 70–99)
Glucose-Capillary: 205 mg/dL — ABNORMAL HIGH (ref 70–99)
Glucose-Capillary: 316 mg/dL — ABNORMAL HIGH (ref 70–99)
Glucose-Capillary: 429 mg/dL — ABNORMAL HIGH (ref 70–99)
Glucose-Capillary: 371 mg/dL — ABNORMAL HIGH (ref 70–99)
Glucose-Capillary: 296 mg/dL — ABNORMAL HIGH (ref 70–99)

## 2020-12-01 MED ORDER — INSULIN GLARGINE 100 UNIT/ML ~~LOC~~ SOLN
15.0000 [IU] | Freq: Every day | SUBCUTANEOUS | Status: DC
  Administered 2020-12-01 – 2020-12-02 (×2): 15 [IU] via SUBCUTANEOUS
  Filled 2020-12-01 (×2): qty 0.15

## 2020-12-01 MED ORDER — INSULIN ASPART 100 UNIT/ML ~~LOC~~ SOLN
0.0000 [IU] | Freq: Three times a day (TID) | SUBCUTANEOUS | Status: DC
  Administered 2020-12-01: 20 [IU] via SUBCUTANEOUS
  Administered 2020-12-02: 3 [IU] via SUBCUTANEOUS
  Administered 2020-12-02: 15 [IU] via SUBCUTANEOUS
  Administered 2020-12-02: 11 [IU] via SUBCUTANEOUS
  Administered 2020-12-03 – 2020-12-04 (×2): 7 [IU] via SUBCUTANEOUS
  Administered 2020-12-04: 3 [IU] via SUBCUTANEOUS

## 2020-12-01 MED ORDER — INSULIN ASPART 100 UNIT/ML ~~LOC~~ SOLN
0.0000 [IU] | Freq: Every day | SUBCUTANEOUS | Status: DC
  Administered 2020-12-01 – 2020-12-02 (×2): 3 [IU] via SUBCUTANEOUS
  Administered 2020-12-03: 2 [IU] via SUBCUTANEOUS

## 2020-12-01 NOTE — Progress Notes (Signed)
Notified Dr. Bonner Puna of pts POC glucose:429. Order to give 20 units of novolog now and recheck blood glucose in 1 hour.

## 2020-12-01 NOTE — Consult Note (Signed)
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 such as possible early MDS or myelofibrosis, given mild splenomegaly and history of high MCV The splenomegaly could cause sequestration and would explain partially his thrombocytopenia He has intermittent elevated bilirubin and chronic elevated LDH and I suspect he has ineffective erythropoiesis in general With his acute illness, it is likely that he could have generalized bone marrow suppression that could drop his blood count further Recommend close monitoring of his blood count over the next few days If his hemoglobin drops to less than 8, I recommend 1 unit of blood transfusion He does not need platelet transfusion support right now unless his platelet count is less than 50,000  Due to his severe illness, I do not recommend bone marrow aspirate and biopsy to be done at this point as it is not going to help US guide treatment decision Dr. 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 No clinical signs of recurrence Continue supportive care  Nosebleed It is unusual to see nosebleed when his platelet count is greater than 50,000 Due to his history of malnutrition, I recommend checking coagulation studies tomorrow to make sure that it is not due to malnutrition with synthetic liver dysfunction causing coagulopathy  Severe COPD exacerbation Agree with  hospitalist team to continue antibiotics and steroid treatment  Severe malnutrition Continue nutritional supplement as directed by dietitian  Type 2 diabetes  Monitor blood sugar carefully while on prednisone  Goals of care discussion The patient is chronically ill I am concerned about his overall functional status I think palliative care consult while he is hospitalized is appropriate to discuss goals of care  Discharge planning The patient is ill and not ready for discharge I will follow tomorrow Plan of care is discussed with primary service [name], MD 12/01/2020 2:53 PM  CHIEF COMPLAINTS/PURPOSE OF CONSULTATION:  Acute on chronic panyctopenia  HISTORY OF PRESENTING ILLNESS:  Geoffrey Wood 75 y.o. male is seen at the request by hospitalist to evaluate this patient with pancytopenia The patient has very poor hearing  Family members are not by the bedside I have reviewed his records extensively He sees Dr. Irene Limbo in the outpatient clinic for chronic pancytopenia, since last year The patient also had diagnosis of parotid cancer status post surgery and radiation treatment He did not receive adjuvant chemotherapy The patient also have skin cancer and basal cell carcinoma as well as history of tongue cancer He has significant past history of smoking and severe COPD  He was last seen by Dr. 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  He had received 2 doses of IVIG on 10/02/2020 and 10/09/2020 at 1 g/kg without improvement of his platelet count  At baseline, his platelet count in 2014 and 2017 were within normal limits.  At this point in time, he was noted to have high MCV but with normal CBC  Scanned electronic record of his blood  work from his primary care doctor's office were reviewed: 02/16/2013, drawn by Dr. Crist Infante showed WBC of 10.5, hemoglobin of 14.9, MCV of 107.4 and platelet count of 291.  His vitamin B12 and liver  enzymes were normal 04/03/2016, drawn by Dr. Crist Infante, his primary care doctor showed white blood cell count of 6, hemoglobin of 14.6, MCV of 105.4 and platelet count of 234  The patient bruises easily.  He does not take chronic prednisone therapy at baseline and is not on oxygen despite significant emphysema of the lung.  He struggled with oral intake over the past 6 months since his radiation treatment.  He sees dietitian regularly.  He did not report bleeding at home such as hematuria or hematochezia. Since admission, he was noted to have mild nosebleed and that is packed.  There is no further signs of bleeding at the time of examination.  His PET CT scan from 05/15/2020 is reviewed which show evidence of splenomegaly.  MEDICAL HISTORY:  Past Medical History:  Diagnosis Date  . Adenomatous colon polyp   . Cataract   . Diabetes mellitus   . Emphysema   . Emphysema of lung ([place])   . Erectile dysfunction   . Hyperlipidemia   . Hypertension   . Peripheral vascular disease ([place])   . Pneumonia   . Pulmonary nodule     SURGICAL HISTORY: Past Surgical History:  Procedure Laterality Date  . ABDOMINAL AORTIC ANEURYSM REPAIR  2004  . COLONOSCOPY  multiple since 2004    SOCIAL HISTORY: Social History   Socioeconomic History  . Marital status: Married    Spouse name: Not on file  . Number of children: Not on file  . Years of education: Not on file  . Highest education level: Not on file  Occupational History  . Occupation: Scientist, clinical (histocompatibility and immunogenetics): Designer, television/film set  Tobacco Use  . Smoking status: Former Smoker    Packs/day: 1.50    Years: 60.00    Pack years: 90.00    Types: Cigarettes    Quit date: 09/17/2014    Years since quitting: 6.2  . Smokeless tobacco: Never Used  Vaping Use  . Vaping Use: Never used  Substance and Sexual Activity  . Alcohol use: Yes    Comment: not even 1 drink a week  . Drug use: No  . Sexual activity: Not Currently  Other Topics  Concern  . Not on file  Social History Narrative  . Not on file   Social Determinants of Health   Financial Resource Strain: Not on file  Food Insecurity: No Food Insecurity  . Worried About Charity fundraiser in the Last Year: Never true  . Ran Out of Food in the Last Year: Never true  Transportation Needs: No Transportation Needs  . Lack of Transportation (Medical): No  . Lack of Transportation (Non-Medical): No  Physical Activity: Not on file  Stress: Not on file  Social Connections: Not on file  Intimate Partner Violence: Not on file    FAMILY HISTORY: Family History  Problem Relation Age of Onset  . Heart failure Mother   . Diabetes Mother   . Cancer Father        lung  . Colon cancer Neg Hx   . Esophageal cancer Neg Hx   . Liver cancer Neg Hx   . Pancreatic cancer Neg Hx   . Rectal cancer Neg Hx   . Stomach cancer Neg Hx  ALLERGIES:  has No Known Allergies.  MEDICATIONS:  Current Facility-Administered Medications  Medication Dose Route Frequency Provider Last Rate Last Admin  . albuterol (PROVENTIL) (2.5 MG/3ML) 0.083% nebulizer solution 2.5 mg  2.5 mg Nebulization Q4H PRN Doutova, Anastassia, MD      . azithromycin (ZITHROMAX) 500 mg in sodium chloride 0.9 % 250 mL IVPB  500 mg Intravenous Q24H Doutova, Anastassia, MD 250 mL/hr at 12/01/20 0111 500 mg at 12/01/20 0111  . cefTRIAXone (ROCEPHIN) 2 g in sodium chloride 0.9 % 100 mL IVPB  2 g Intravenous Q24H Doutova, Anastassia, MD 200 mL/hr at 12/01/20 0028 2 g at 12/01/20 0028  . feeding supplement (KATE FARMS STANDARD 1.4) liquid 325 mL  325 mL Oral TID BM Patrecia Pour, MD   325 mL at 12/01/20 0801  . insulin aspart (novoLOG) injection 0-20 Units  0-20 Units Subcutaneous TID WC Vance Gather B, MD      . insulin aspart (novoLOG) injection 0-5 Units  0-5 Units Subcutaneous QHS Vance Gather B, MD      . ipratropium-albuterol (DUONEB) 0.5-2.5 (3) MG/3ML nebulizer solution 3 mL  3 mL Nebulization TID Toy Baker, MD   3 mL at 12/01/20 1404  . multivitamin with minerals tablet 1 tablet  1 tablet Oral Daily Patrecia Pour, MD   1 tablet at 12/01/20 0800  . pantoprazole (PROTONIX) EC tablet 40 mg  40 mg Oral Daily Margaretha Seeds, MD   40 mg at 12/01/20 0800  . predniSONE (DELTASONE) tablet 60 mg  60 mg Oral Q breakfast Margaretha Seeds, MD   60 mg at 12/01/20 0759  . protein supplement (RESOURCE BENEPROTEIN) powder packet 6 g  1 Scoop Oral TID WC Patrecia Pour, MD   6 g at 12/01/20 1211  . Resource ThickenUp Clear   Oral PRN Patrecia Pour, MD      . sodium chloride flush (NS) 0.9 % injection 3 mL  3 mL Intravenous Q12H Doutova, Anastassia, MD   3 mL at 12/01/20 0800   Facility-Administered Medications Ordered in Other Encounters  Medication Dose Route Frequency Provider Last Rate Last Admin  . 0.9 %  sodium chloride infusion   Intravenous Continuous Magrinat, Virgie Dad, MD 500 mL/hr at 11/26/20 1712 New Bag at 11/26/20 1712  . acetaminophen (TYLENOL) tablet 650 mg  650 mg Oral Once Brunetta Genera, MD      . albuterol (PROVENTIL) (2.5 MG/3ML) 0.083% nebulizer solution 2.5 mg  2.5 mg Nebulization Q4H PRN Brunetta Genera, MD      . loratadine (CLARITIN) tablet 10 mg  10 mg Oral Once Brunetta Genera, MD        REVIEW OF SYSTEMS: Unable to obtain due to his poor hearing.  He felt that he is breathing better since admission  PHYSICAL EXAMINATION: ECOG PERFORMANCE STATUS: 2 - Symptomatic, <50% confined to bed  Vitals:   12/01/20 1344 12/01/20 1404  BP: 103/61   Pulse: 71   Resp: (!) 21   Temp: 97.6 F (36.4 C)   SpO2: 99% 100%   There were no vitals filed for this visit.  GENERAL:alert, no distress and comfortable.  He has face mask in situ and is on oxygen.  He is falling asleep intermittently.  Very poor hearing. SKIN: Noted significant skin bruises but no petechiae  EYES: normal, conjunctiva are pale and non-injected, sclera clear OROPHARYNX: Unable to examine due to  due to facemask.  He has tissue paper stuffed on  his nasal passages.  He predominantly breathes through his mouth.  Hearing aid in situ NECK: Significant surgical changes related to his parotid surgery LYMPH:  no palpable lymphadenopathy in the cervical, axillary or inguinal LUNGS: Notrf increased breathing effort.  Mild bilateral crackles on exam HEART: regular rate & rhythm and no murmurs and no lower extremity edema ABDOMEN:abdomen soft, non-tender and normal bowel sounds Musculoskeletal:no cyanosis of digits and no clubbing  PSYCH: alert but sleepy NEURO: no focal motor/sensory deficits  LABORATORY DATA:  I have reviewed the data as listed Last CBC Lab Results  Component Value Date   WBC 3.0 (L) 12/01/2020   HGB 8.4 (L) 12/01/2020   HCT 28.0 (L) 12/01/2020   MCV 82.4 12/01/2020   MCH 24.7 (L) 12/01/2020   RDW 28.3 (H) 12/01/2020   PLT 63 (L) 12/01/2020   I have reviewed 3 slides in the laboratory.  Peripheral smear is grossly abnormal.  Dysplastic WBC is noted.  No increased blasts.  Noted significant nucleated RBC and signs of polychromasia.  Reduced platelet count in general is seen.  No platelet clumping.  Occasional rare schistocytes  RADIOGRAPHIC STUDIES: I have personally reviewed the radiological images as listed and agreed with the findings in the report. CT Angio Chest PE W and/or Wo Contrast  Result Date: 11/28/2020 CLINICAL DATA:  Dyspnea EXAM: CT ANGIOGRAPHY CHEST WITH CONTRAST TECHNIQUE: Multidetector CT imaging of the chest was performed using the standard protocol during bolus administration of intravenous contrast. Multiplanar CT image reconstructions and MIPs were obtained to evaluate the vascular anatomy. CONTRAST:  176mL OMNIPAQUE IOHEXOL 350 MG/ML SOLN COMPARISON:  09/26/2020 FINDINGS: Cardiovascular: There is adequate opacification of the pulmonary arterial tree. There is no intraluminal filling defect identified to suggest acute pulmonary embolism. The central  pulmonary arteries are of normal caliber. Mild atherosclerotic calcification within the thoracic aorta. The thoracic aorta is dilated, measuring 4.2 x 3.9 cm in its ascending segment (coronal # 55/sagittal # 85) and 3.8 x 3.4 cm just beyond the takeoff of the left subclavian vein within the aortic arch (axial # 44/sagittal # 96). At the level of the diaphragmatic hiatus, the descending thoracic aorta measures 3.2 cm. Extensive multi-vessel coronary artery calcification. Mild global cardiomegaly with particular enlargement of the a right ventricle. No pericardial effusion. Mediastinum/Nodes: There has developed extensive bilateral hilar and mediastinal adenopathy, possibly reactive in nature given its relatively rapid development since prior examination. Index right hilar lymph node measures 2.3 cm at axial image # 70 and left hilar lymph node measures 1.5 cm at axial image # 74. The visualized thyroid is unremarkable. The esophagus is unremarkable. Lungs/Pleura: Severe centrilobular and paraseptal emphysema, more severe within the apices bilaterally. Since the prior examination, there has developed extensive bibasilar ground-glass pulmonary infiltrate and pulmonary consolidation, more severe within the right lower lobe. Small right pleural effusion has developed. Together, the findings are suspicious for atypical infection, aspiration, or less commonly, drug reaction. Small probable associated right parapneumonic effusion. No pneumothorax. The central airways are widely patent. Upper Abdomen: No acute abnormality. Musculoskeletal: No acute bone abnormality. No suspicious lytic or blastic bone lesion. Review of the MIP images confirms the above findings. IMPRESSION: No pulmonary embolism. Severe centrilobular emphysema. Interval development of extensive bibasilar pulmonary infiltrate and consolidation most in keeping with atypical infection in the acute setting with small associated right parapneumonic effusion. Less  commonly, this may be seen with aspiration or drug reaction. Extensive mediastinal adenopathy may be reactive in nature given its relatively rapid development.  Follow-up imaging in 3 months, once the patient acute issues have resolved, would be helpful in documenting resolution. Extensive multi-vessel coronary artery calcification. Mild cardiomegaly with particular enlargement of the right ventricle. Dilation of the thoracic aorta with maximal dimension of 4.2 cm within the ascending segment. Recommend annual imaging followup by CTA or MRA. This recommendation follows 2010 ACCF/AHA/AATS/ACR/ASA/SCA/SCAI/SIR/STS/SVM Guidelines for the Diagnosis and Management of Patients with Thoracic Aortic Disease. Circulation. 2010; 121: O878-M767. Aortic aneurysm NOS (ICD10-I71.9) Aortic aneurysm NOS (ICD10-I71.9). Aortic Atherosclerosis (ICD10-I70.0) and Emphysema (ICD10-J43.9). Electronically Signed   By: Fidela Salisbury MD   On: 11/28/2020 23:19   DG Chest Port 1 View  Result Date: 11/28/2020 CLINICAL DATA:  Chronic oxygen dependent patient presenting with a 1 week history of progressively worsening shortness of breath and hypoxemia (oxygen saturation in the 70s and 80s). Current history of UIP/pulmonary fibrosis. EXAM: PORTABLE CHEST 1 VIEW COMPARISON:  Chest x-ray 07/15/2020. High-resolution CT chest 09/26/2020. FINDINGS: Cardiac silhouette upper normal in size for AP portable technique. Interstitial opacities throughout both lungs which are not felt to be significantly changed since the high-resolution CT. More confluent opacity peripherally in the mid RIGHT lung was shown on the CT to represent scarring. IMPRESSION: Chronic interstitial lung disease/pulmonary fibrosis. No convincing evidence of acute cardiopulmonary disease. Electronically Signed   By: Evangeline Dakin M.D.   On: 11/28/2020 21:55   DG Swallowing Func-Speech Pathology  Result Date: 11/30/2020 Objective Swallowing Evaluation: Type of Study:  MBS-Modified Barium Swallow Study  Patient Details Name: Geoffrey Wood MRN: [id number] Date of Birth: 1945-11-16 Today's Date: 11/30/2020 Time: SLP Start Time (ACUTE ONLY): 1045 -SLP Stop Time (ACUTE ONLY): 1115 SLP Time Calculation (min) (ACUTE ONLY): 30 min Past Medical History: Past Medical History: Diagnosis Date . Adenomatous colon polyp  . Cataract  . Diabetes mellitus  . Emphysema  . Emphysema of lung ([place])  . Erectile dysfunction  . Hyperlipidemia  . Hypertension  . Peripheral vascular disease ([place])  . Pneumonia  . Pulmonary nodule  Past Surgical History: Past Surgical History: Procedure Laterality Date . ABDOMINAL AORTIC ANEURYSM REPAIR  2004 . COLONOSCOPY  multiple since 2004 HPI: Geoffrey Wood is a 75 y.o. 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 CT Chest shows "Severe centrilobular and paraseptal emphysema, more  severe within the apices bilaterally. Since the prior examination,  there has developed extensive bibasilar ground-glass pulmonary  infiltrate and pulmonary consolidation, more severe within the right  lower lobe. Small right pleural effusion has developed. 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 On 06-03-20 Dr. Nicolette Bang performed rt parotidectomy with facial nerve dissection which ultimately req'd sacrifice of upper division of rt facial nerve. Rt selective neck dissection also that date.  Pt had three sessions of OP SLP therapy due to facial nerve paresis. Per ENT "He also recieved postoperative radiation therapy but  did not f/u with SLP therapy after that.  Since that time he is always had some hearing problems and wears hearing aids but his hearing in his right ear has  been worse.  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  No data recorded Assessment / Plan / Recommendation CHL IP CLINICAL IMPRESSIONS 11/30/2020 Clinical Impression Pt demonstrates a mild to moderate dysphagia with trace to mild sensed aspiration events that now pt reports are new over the past few days.  Oral deficits include anterior loss during chin tuck, base of tongue weakness, likely on the right leading to mild premature spillage during bolus formation with trace penetration events before the swallow and one instance of significant aspiration with hard cough. Pt also has decreased laryngeal closure during the swallow though epiglottic deflection and laryngal elevation with glottic clousre are quite complete and sustained in many attempts. Suspect decreased seal of arytenoid tissue leading to penetration during the swallow and eventual trace aspiration as glottic closure is released. Pt typically senses this with a throat clear or cough, though not all aspirate is cleared. Nectar thick liquids and puree are tolerated without aspiration and a cue for an effortful swallow eliminates mild vallecular residue that was otherwise present. Generally, this dysphagia is mild and possibly temporary if pt can improve conditioning and general strength and pulmonary hygiene. Recommend puree (pts baseline as he does not masticate solids since surgery)and nectar thick liquids during acute phase of rehabilitation. Will benefit from RMST, training for preventative cough strategy and strengthning for increased laryngeal closure as well as preventative oral hygiene measures. SLP Visit Diagnosis Dysphagia, oropharyngeal phase (R13.12) Attention and concentration deficit following -- Frontal lobe and executive function  deficit following -- Impact on safety and function Moderate aspiration risk;Risk for inadequate nutrition/hydration   CHL IP TREATMENT RECOMMENDATION 11/30/2020 Treatment Recommendations Therapy as outlined in treatment plan below   Prognosis 11/30/2020 Prognosis for Safe Diet Advancement Good Barriers to Reach Goals -- Barriers/Prognosis Comment -- CHL IP DIET RECOMMENDATION 11/30/2020 SLP Diet Recommendations Dysphagia 1 (Puree) solids;Nectar thick liquid Liquid Administration via Straw Medication Administration Whole meds with liquid Compensations Slow rate;Small sips/bites;Effortful swallow;Clear throat after each swallow Postural Changes --   No flowsheet data found.  CHL IP FOLLOW UP RECOMMENDATIONS 11/30/2020 Follow up Recommendations Inpatient Rehab   CHL IP FREQUENCY AND DURATION 11/30/2020 Speech Therapy Frequency (ACUTE ONLY) min 2x/week Treatment Duration 2 weeks      CHL IP ORAL PHASE 11/30/2020 Oral Phase Impaired Oral - Pudding Teaspoon -- Oral - Pudding Cup -- Oral - Honey Teaspoon -- Oral - Honey Cup -- Oral - Nectar Teaspoon -- Oral - Nectar Cup -- Oral - Nectar Straw Premature spillage Oral - Thin Teaspoon -- Oral - Thin Cup -- Oral - Thin Straw Premature spillage;Right anterior bolus loss Oral - Puree WFL Oral - Mech Soft -- Oral - Regular -- Oral - Multi-Consistency -- Oral - Pill -- Oral Phase - Comment --  CHL IP PHARYNGEAL PHASE 11/30/2020 Pharyngeal Phase Impaired Pharyngeal- Pudding Teaspoon -- Pharyngeal -- Pharyngeal- Pudding Cup -- Pharyngeal -- Pharyngeal- Honey Teaspoon -- Pharyngeal -- Pharyngeal- Honey Cup -- Pharyngeal -- Pharyngeal- Nectar Teaspoon -- Pharyngeal -- Pharyngeal- Nectar Cup -- Pharyngeal -- Pharyngeal- Nectar Straw Reduced epiglottic inversion;Reduced tongue base retraction;Pharyngeal residue - valleculae Pharyngeal -- Pharyngeal- Thin Teaspoon -- Pharyngeal -- Pharyngeal- Thin Cup -- Pharyngeal -- Pharyngeal- Thin Straw Trace aspiration;Penetration/Aspiration before  swallow;Penetration/Aspiration during swallow;Reduced airway/laryngeal closure;Reduced epiglottic inversion Pharyngeal -- Pharyngeal- Puree Pharyngeal residue - valleculae Pharyngeal -- Pharyngeal- Mechanical Soft -- Pharyngeal -- Pharyngeal- Regular -- Pharyngeal -- Pharyngeal- Multi-consistency -- Pharyngeal -- Pharyngeal- Pill -- Pharyngeal -- Pharyngeal  Comment --  No flowsheet data found. DeBlois, Katherene Ponto 11/30/2020, 11:55 AM              ECHOCARDIOGRAM COMPLETE  Result Date: 11/29/2020    ECHOCARDIOGRAM REPORT   Patient Name:   Geoffrey Wood Date of Exam: 11/29/2020 Medical Rec #:  [id number]       Height:       72.0 in Accession #:    [id number]      Weight:       143.8 lb Date of Birth:  04/21/1946        BSA:          1.852 m Patient Age:    75 years        BP:           94/54 mmHg Patient Gender: M               HR:           65 bpm. Exam Location:  Inpatient Procedure: 2D Echo, Cardiac Doppler and Color Doppler Indications:    COPD  History:        Patient has prior history of Echocardiogram examinations, most                 recent 08/26/2017. Risk Factors:Dyslipidemia, Hypertension and                 Diabetes.  Sonographer:    Luisa Hart RDCS Referring Phys: [place]  1. Left ventricular ejection fraction, by estimation, is 65 to 70%. The left ventricle has normal function. The left ventricle has no regional wall motion abnormalities. Left ventricular diastolic parameters were normal.  2. Right ventricular systolic function is low normal. The right ventricular size is moderately enlarged. There is mildly elevated pulmonary artery systolic pressure.  3. Left atrial size was mild to moderately dilated.  4. Right atrial size was mild to moderately dilated.  5. The mitral valve is grossly normal. Trivial mitral valve regurgitation.  6. The aortic valve is tricuspid. There is mild calcification of the aortic valve. There is mild thickening of the aortic valve. Aortic  valve regurgitation is not visualized. Mild aortic valve sclerosis is present, with no evidence of aortic valve stenosis.  7. The inferior vena cava is dilated in size with <50% respiratory variability, suggesting right atrial pressure of 15 mmHg. Comparison(s): Prior images unable to be directly viewed, comparison made by report only. Conclusion(s)/Recommendation(s): RV moderately enlarged, with mildly elevated RVSP. Normal LVEF. FINDINGS  Left Ventricle: Left ventricular ejection fraction, by estimation, is 65 to 70%. The left ventricle has normal function. The left ventricle has no regional wall motion abnormalities. The left ventricular internal cavity size was normal in size. There is  borderline left ventricular hypertrophy. Left ventricular diastolic parameters were normal. Right Ventricle: The right ventricular size is moderately enlarged. Right vetricular wall thickness was not well visualized. Right ventricular systolic function is low normal. There is mildly elevated pulmonary artery systolic pressure. The tricuspid regurgitant velocity is 2.57 m/s, and with an assumed right atrial pressure of 15 mmHg, the estimated right ventricular systolic pressure is 74.2 mmHg. Left Atrium: Left atrial size was mild to moderately dilated. Right Atrium: Right atrial size was mild to moderately dilated. Pericardium: There is no evidence of pericardial effusion. Mitral Valve: The mitral valve is grossly normal. Trivial mitral valve regurgitation. Tricuspid Valve: The tricuspid valve is normal in structure. Tricuspid valve regurgitation is mild. Aortic Valve:  The aortic valve is tricuspid. There is mild calcification of the aortic valve. There is mild thickening of the aortic valve. Aortic valve regurgitation is not visualized. Mild aortic valve sclerosis is present, with no evidence of aortic valve stenosis. Aortic valve mean gradient measures 6.0 mmHg. Aortic valve peak gradient measures 11.8 mmHg. Aortic valve area, by  VTI measures 2.51 cm. Pulmonic Valve: The pulmonic valve was grossly normal. Pulmonic valve regurgitation is not visualized. No evidence of pulmonic stenosis. Aorta: The aortic root and ascending aorta are structurally normal, with no evidence of dilitation. Venous: The inferior vena cava is dilated in size with less than 50% respiratory variability, suggesting right atrial pressure of 15 mmHg. IAS/Shunts: The atrial septum is grossly normal.  LEFT VENTRICLE PLAX 2D LVIDd:         4.66 cm     Diastology LVIDs:         2.88 cm     LV e' medial:    7.72 cm/s LV PW:         1.20 cm     LV E/e' medial:  6.0 LV IVS:        0.70 cm     LV e' lateral:   10.40 cm/s LVOT diam:     2.60 cm     LV E/e' lateral: 4.5 LV SV:         100 LV SV Index:   54 LVOT Area:     5.31 cm  LV Volumes (MOD) LV vol d, MOD A2C: 85.5 ml LV vol s, MOD A2C: 28.9 ml LV SV MOD A2C:     56.6 ml RIGHT VENTRICLE RV S prime:     10.10 cm/s  PULMONARY VEINS TAPSE (M-mode): 1.6 cm      A Reversal Duration: 77.00 msec                             A Reversal Velocity: 16.80 cm/s                             Diastolic Velocity:  89.37 cm/s                             S/D Velocity:        1.40                             Systolic Velocity:   34.28 cm/s AORTIC VALVE                    PULMONIC VALVE AV Area (Vmax):    2.58 cm     PV Vmax:       0.94 m/s AV Area (Vmean):   2.81 cm     PV Vmean:      79.900 cm/s AV Area (VTI):     2.51 cm     PV VTI:        0.263 m AV Vmax:           172.00 cm/s  PV Peak grad:  3.5 mmHg AV Vmean:          113.000 cm/s PV Mean grad:  3.0 mmHg AV VTI:            0.400 m AV Peak Grad:  11.8 mmHg AV Mean Grad:      6.0 mmHg LVOT Vmax:         83.50 cm/s LVOT Vmean:        59.900 cm/s LVOT VTI:          0.189 m LVOT/AV VTI ratio: 0.47  AORTA Ao Root diam: 4.00 cm Ao Asc diam:  3.70 cm MITRAL VALVE               TRICUSPID VALVE MV Area (PHT): 2.50 cm    TR Peak grad:   26.4 mmHg MV Decel Time: 303 msec    TR Vmax:         257.00 cm/s MV E velocity: 46.70 cm/s MV A velocity: 83.90 cm/s  SHUNTS MV E/A ratio:  0.56        Systemic VTI:  0.19 m                            Systemic Diam: 2.60 cm Buford Dresser MD Electronically signed by Buford Dresser MD Signature Date/Time: 11/29/2020/6:11:02 PM    Final

## 2020-12-01 NOTE — Progress Notes (Signed)
   12/01/20 2048  Assess: MEWS Score  Temp 97.9 F (36.6 C)  BP (!) 98/56  Pulse Rate 73  Resp (!) 22  Level of Consciousness Alert  SpO2 99 %  O2 Device Simple Mask  O2 Flow Rate (L/min) 6 L/min  Assess: MEWS Score  MEWS Temp 0  MEWS Systolic 1  MEWS Pulse 0  MEWS RR 1  MEWS LOC 0  MEWS Score 2  MEWS Score Color Yellow  Assess: if the MEWS score is Yellow or Red  Were vital signs taken at a resting state? Yes  Focused Assessment No change from prior assessment  Early Detection of Sepsis Score *See Row Information* Low  MEWS guidelines implemented *See Row Information* Yes  Treat  MEWS Interventions Other (Comment) (pt is stable and has hx of yellow mews often. will continue to monitor.)  Pain Scale 0-10  Pain Score 0  Take Vital Signs  Increase Vital Sign Frequency  Yellow: Q 2hr X 2 then Q 4hr X 2, if remains yellow, continue Q 4hrs  Escalate  MEWS: Escalate Yellow: discuss with charge nurse/RN and consider discussing with provider and RRT  Notify: Charge Nurse/RN  Name of Charge Nurse/RN Notified Winnie RN  Date Charge Nurse/RN Notified 12/01/20  Time Charge Nurse/RN Notified 2058  Document  Patient Outcome Stabilized after interventions  Progress note created (see row info) Yes

## 2020-12-01 NOTE — Consult Note (Addendum)
Consultation Note Date: 12/01/2020   Patient Name: Geoffrey Wood  DOB: 10-07-1945  MRN: [id number]  Age / Sex: 75 y.o., male  PCP: Crist Infante, MD Referring Physician: Patrecia Pour, MD  Reason for Consultation: Establishing goals of care  HPI/Patient Profile: 75 y.o. male    admitted on 11/28/2020    Clinical Assessment and Goals of Care: 75 year old gentleman with severe emphysema idiopathic pulmonary fibrosis recently started on home oxygen 4 L.  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  Patient presented with progressive shortness of breath was found to be severely hypoxic and was admitted to hospital medicine service and CT scan of the chest showed extensive bibasilar consolidations extensive mediastinal adenopathy.  Patient has been seen and evaluated by pulmonary medicine as well as oncology input has also been requested.  Patient remains admitted to hospital medicine service.  Palliative consultation for goals of care discussions has been requested.  Patient is awake alert sitting in chair.  He is wearing a facemask.  He has nosebleed.  I helped clean that up.  Patient awake alert a little hard of hearing but is able to respond appropriately.  Patient states that he remains hopeful that he will continue to get better.  He wishes to focus his efforts on getting better.  He asked that I give his wife a call.  Call placed and discussed with [name].  I introduced myself and palliative care as follows:  Palliative medicine is specialized medical care for people living with serious illness. It focuses on providing relief from the symptoms and stress of a serious illness. The goal is to improve quality of life for both the patient and the  family.  Goals of care: Broad aims of medical therapy in relation to the patient's values and preferences. Our aim is to provide medical care aimed at enabling patients to achieve the goals that matter most to them, given the circumstances of their particular medical situation and their constraints.   Brief life review performed.  Goals wishes and values important to the patient and family as a unit attempted to be explored.  Patient lives with his wife in [place], [place].  They have been married for 57 years.  They have no children but they have a niece and nephew who live out of town, wife states that they have a good support system over here.  She describes the patient as a very functional person and who worked and traveled all over.  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  She states, "we are not your average late 70s frail people, we are fighters and motivated to not give up.  It is Teigen's goal to be able to play golf again."  We discussed about scope of current hospitalization.  We discussed about current treatment plan.  She requested for specific lab information such as his hemoglobin numbers as well as his platelet count.  All of her questions addressed to the best of my ability.  Patient's wife is hopeful that he will be able to go for his PET scan appointment which is scheduled for this Thursday.  Additionally he also has an ENT appointment coming up in 2 to 3 days.  At home, she states she was trying to keep up with his oral intake and was trying to monitor his weight.  HCPOA Wife [name]  SUMMARY OF RECOMMENDATIONS   Full code/full scope care.  Discussed with patient, also discussed with wife Mrs. [name] on the phone in detail.  Code Status/Advance Care Planning:  Full code    Symptom Management:      Palliative Prophylaxis:   Aspiration  Additional Recommendations (Limitations, Scope,  Preferences):  Full Scope Treatment  Psycho-social/Spiritual:   Desire for further Chaplaincy support:yes  Additional Recommendations: Caregiving  Support/Resources  Prognosis:   Unable to determine  Discharge Planning: To Be Determined      Primary Diagnoses: Present on Admission: . COPD exacerbation ([place]) . IPF (idiopathic pulmonary fibrosis) ([place]) . 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 Hyperlipidemia . HTN (hypertension) . Coronary artery calcification . CAP (community acquired pneumonia) . Cellulitis   I have reviewed the medical record, interviewed the patient and family, and examined the patient. The following aspects are pertinent.  Past Medical History:  Diagnosis Date  . Adenomatous colon polyp   . Cataract   . Diabetes mellitus   . Emphysema   . Emphysema of lung ([place])   . Erectile dysfunction   . Hyperlipidemia   . Hypertension   . Peripheral vascular disease ([place])   . Pneumonia   . Pulmonary nodule    Social History   Socioeconomic History  . Marital status: Married    Spouse name: Not on file  . Number of children: Not on file  . Years of education: Not on file  . Highest education level: Not on file  Occupational History  . Occupation: Scientist, clinical (histocompatibility and immunogenetics): Designer, television/film set  Tobacco Use  . Smoking status: Former Smoker    Packs/day: 1.50    Years: 60.00    Pack years: 90.00    Types: Cigarettes    Quit date: 09/17/2014    Years since quitting: 6.2  . Smokeless tobacco: Never Used  Vaping Use  . Vaping Use: Never used  Substance and Sexual Activity  . Alcohol use: Yes    Comment: not even 1 drink a week  . Drug use: No  . Sexual activity: Not Currently  Other Topics Concern  . Not on file  Social History Narrative  . Not on file   Social Determinants of Health   Financial Resource Strain: Not on file  Food Insecurity: No Food Insecurity  . Worried About Charity fundraiser in the Last Year: Never true  . Ran Out of  Food in the Last Year: Never true  Transportation Needs: No Transportation Needs  . Lack of Transportation (Medical): No  . Lack of Transportation (Non-Medical): No  Physical Activity: Not on file  Stress: Not on file  Social Connections: Not on file   Family History  Problem Relation Age of Onset  . Heart failure Mother   . Diabetes Mother   . Cancer Father        lung  . Colon cancer Neg Hx   . Esophageal cancer  Neg Hx   . Liver cancer Neg Hx   . Pancreatic cancer Neg Hx   . Rectal cancer Neg Hx   . Stomach cancer Neg Hx    Scheduled Meds: . feeding supplement (KATE FARMS STANDARD 1.4)  325 mL Oral TID BM  . insulin aspart  0-20 Units Subcutaneous TID WC  . insulin aspart  0-5 Units Subcutaneous QHS  . ipratropium-albuterol  3 mL Nebulization TID  . multivitamin with minerals  1 tablet Oral Daily  . pantoprazole  40 mg Oral Daily  . predniSONE  60 mg Oral Q breakfast  . protein supplement  1 Scoop Oral TID WC  . sodium chloride flush  3 mL Intravenous Q12H   Continuous Infusions: . azithromycin 500 mg (12/01/20 0111)  . cefTRIAXone (ROCEPHIN)  IV 2 g (12/01/20 0028)   PRN Meds:.albuterol, Resource ThickenUp Clear Medications Prior to Admission:  Prior to Admission medications   Medication Sig Start Date End Date Taking? Authorizing Provider  acetaminophen (TYLENOL) 325 MG tablet Take 325 mg by mouth every 6 (six) hours as needed for mild pain.   Yes [provider]  ANORO ELLIPTA 62.5-25 MCG/INH AEPB Take 1 puff by mouth daily. 08/20/17  Yes [provider]  Ascorbic Acid (VITAMIN C) 500 MG CAPS Take 1 capsule by mouth 2 (two) times daily.   Yes [provider]  Cholecalciferol (VITAMIN D PO) Take 800 Units by mouth daily.   Yes [provider]  Cyanocobalamin (VITAMIN B 12 PO) Take 500 mcg by mouth daily.   Yes [provider]  mirtazapine (REMERON) 15 MG tablet Take 45 mg by mouth at bedtime. 09/03/20  Yes [provider]  b complex vitamins capsule Take 1 capsule by mouth daily.    [provider]  [facility] VERIO test strip  07/12/18   [provider]   No Known Allergies Review of Systems +nose bleed  Physical Exam Patient on face mask oxygen, sitting up in a chair is a having nosebleeding from his nostril Has clubbing of his digits.  Appears frail and weak Awake alert oriented hard of hearing but answers questions appropriately Scattered crackles S1-S2 Mood and affect within normal limits  Vital Signs: BP 103/61 (BP Location: Right Arm)   Pulse 71   Temp 97.6 F (36.4 C) (Oral)   Resp (!) 21   SpO2 100%  Pain Scale: 0-10   Pain Score: 0-No pain   SpO2: SpO2: 100 % O2 Device:SpO2: 100 % O2 Flow Rate: .O2 Flow Rate (L/min): 6 L/min  IO: Intake/output summary:   Intake/Output Summary (Last 24 hours) at 12/01/2020 1420 Last data filed at 12/01/2020 1344 Gross per 24 hour  Intake 2101 ml  Output 3450 ml  Net -1349 ml    LBM:   Baseline Weight:   Most recent weight:       Palliative Assessment/Data:   PPS 40%  Time In:  1320 Time Out:  1420 Time Total:  60  Greater than 50%  of this time was spent counseling and coordinating care related to the above assessment and plan.  Signed by: Loistine Chance, MD   Please contact Palliative Medicine Team phone at [phone] for questions and concerns.  For individual provider: See Shea Evans

## 2020-12-01 NOTE — Progress Notes (Signed)
PROGRESS NOTE  Geoffrey Wood  OVF:[id number] DOB: 1946-01-18 DOA: 11/28/2020 PCP: Geoffrey Infante, MD  Outpatient Specialists: Oncology, Dr. Irene Wood; ENT, Dr. Lucia Wood; Pulmonary, Dr. Vaughan Wood Brief Narrative: Geoffrey Wood is a 75 y.o. male with a history of severe emphysema, IPF recently started on 4L O2 at home, [facility] of right parotid s/p excision and radiation with chronic wound, ITP, HTN, and hard of hearing who presented to the ED late 4/14 for progressive shortness of breath and hypoxia to 70-80%'s despite starting 4L O2 at home. He was confirmed to be hypoxic, due to mouth breathing, placed on simple mask with improved oxygenation. CXR demonstrated chronic interstial lung disease/fibrosis which appeared stable from prior HRCT. Subsequent CTA chest revealed extensive bibasilar infiltrate/consolidations with small right parapneumonic effusion with extensive mediastinal adenopathy. No PE, severe emphysema. Antibiotics were given and patient admitted. Pulmonary was consulted, started patient on prednisone 1mg /kg 4/17. CBC reveals pancytopenia for which hematology input is requested.  Assessment & Plan: Active Problems:   HTN (hypertension)   Hyperlipidemia   Coronary artery calcification   Immune thrombocytopenia (HCC)   IPF (idiopathic pulmonary fibrosis) (HCC)   COPD exacerbation (HCC)   CAP (community acquired pneumonia)   Cellulitis   Protein-calorie malnutrition, severe  Acute on chronic hypoxic respiratory failure: Due to bibasilar pneumonia, suspected ILD flare, COPD exacerbation, pleural effusion on baseline severe emphysema, IPF. No PE on CTA. Covid and flu negative.  - Started prednisone 1 mg/kg daily per PCCM recommendations on 4/16.  - Continue typical abx (CTX, azithromycin) for now. PCT reassuring, WBC normal.  - Dysphagia 1 diet per SLP. - Monitor blood, sputum cultures, urine Ag's, and broader viral respiratory panel (some preceding URI symptoms) - Continue bronchodilators  scheduled duoneb and prn albuterol - BNP grossly elevated with cardiomegaly, particularly RV enlargement. Echo shows RV enlargement and elevated right-sided pressures consistent with developing cor pulmonale. normal LV. Given dose of lasix 4/16. - Repeat CT recommended in 3 months to confirm resolution of infiltrates, effusion, and adenopathy.   IPF, COPD: Followed by pulmonary, Dr. Vaughan Wood. Held off initiation of treatment at Feb visit with plans to follow up in 6 months. - Plan per pulm as above.  - Palliative care also following patient.  ITP, chronic iron-deficiency anemia, AOCD: Follows with Dr. Irene Wood, treated with IVIG in the past.  - Platelets still near baseline despite slight drop, still >50k though with some nose bleeding. I have consulted Dr. Alvy Wood, heme/onc for any further recommendations.  - VTE ppx has been held since admission. No antiplatelet agents either.  - Continue monitoring CBC, continue po iron.  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  - Follow up with ENT as scheduled 4/20 - Has CT soft tissue head/neck scheduled 4/21 with follow up with Dr. Isidore Wood 4/22.  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 This has not changed and he's told it's unlikely to ever close up. Has continued wound care by his wife diligently.  - Has follow up with Dr. Lucia Wood on 4/20. Will defer further management to him. Do not feel this requires targeted treatment for cellulitis at this time.  Severe protein-calorie malnutrition:  - Supplement protein as much as possible. Medical record indicates a feeding tube is being considered.   Hypotension: This appears to be a nonacute issue. No evidence of sepsis or brisk large vessel  bleeding.  - Caution with diuresis.   Hyperbilirubinemia: Mild, has history of same. Hgb is  stable.   Extensive multivessel CAD, PVD, HLD: Noted on CTA. TWI, ST depressions on ECG. - Troponin reassuring x2, no chest pain. LVEF 02-77%, normal diastolic parameters and wall motion. RV enlarged with elevated PASP and RA pressure, moderate biatrial enlargement.   Ascending aortic aneurysm: 4.2cm on CTA. Also s/p AAA repair 2004. - Follow up CTA or MRA recommended annually.  T2DM: HbA1c 5.5% consistent with good/too tight of glycemic control, though no reports of hypoglycemic symptoms/episodes. - SSI  DVT prophylaxis: SCD Code Status: Full Family Communication: None at bedside Disposition Plan:  Status is: Inpatient  Remains inpatient appropriate because:Inpatient level of care appropriate due to severity of illness  Dispo: The patient is from: Home              Anticipated d/c is to: Home              Patient currently is not medically stable to d/c.   Difficult to place patient No  Consultants:   Pulmonary  Heme/onc  Palliative care  Procedures:  Echo 11/29/2020  Antimicrobials:  Vancomycin  4/14  Ceftriaxone, azithromycin 4/14 >>   Subjective: Shortness of breath may be a bit better today, still on a fair amount of oxygen through mask. Having nosebleed this morning which subsided during encounter. No chest pain.   Objective: Vitals:   12/01/20 0419 12/01/20 0529 12/01/20 0755 12/01/20 0852  BP: (!) 95/46 93/60 (!) 95/53   Pulse: 67 66 64   Resp:   18   Temp:   98 F (36.7 C)   TempSrc:   Oral   SpO2:   100% 95%    Intake/Output Summary (Last 24 hours) at 12/01/2020 1223 Last data filed at 12/01/2020 0932 Gross per 24 hour  Intake 1865 ml  Output 3200 ml  Net -1335 ml   Gen: 75 y.o. male in no distress, chronically ill-appearing Pulm: Nonlabored with bibasilar crackles, diminished throughout.  CV: Regular rate and rhythm. No murmur, rub, or gallop. No JVD, minimal dependent edema. GI: Abdomen soft, non-tender, non-distended, with normoactive  bowel sounds.  Ext: Warm, no deformities Skin: No new rashes, lesions or ulcers on visualized skin. Right facial wound dressed, c/d/i without erythema or odor. Neuro: Alert and oriented. No focal neurological deficits. Psych: Judgement and insight appear fair. Mood euthymic & affect congruent. Behavior is appropriate.    Data Reviewed: I have personally reviewed following labs and imaging studies  CBC: Recent Labs  Lab 11/28/20 2149 11/28/20 2204 11/29/20 0103 12/01/20 0716  WBC 6.1  --  6.3 3.0*  NEUTROABS 3.4  --  3.9  --   HGB 9.6* 9.2* 9.0* 8.4*  HCT 30.9* 27.0* 29.1* 28.0*  MCV 81.3  --  81.3 82.4  PLT 82*  --  78* 63*   Basic Metabolic Panel: Recent Labs  Lab 11/28/20 2149 11/28/20 2204 11/29/20 0103 12/01/20 0759  NA 134* 136 131* 135  K 4.1 4.2 4.1 3.7  CL 103 102 103 102  CO2 23  --  20* 25  GLUCOSE 212* 203* 197* 213*  BUN 24* 23 21 18   CREATININE 1.05 0.90 0.90 0.75  CALCIUM 8.4*  --  8.2* 7.9*  MG 2.1  --   --   --    GFR: Estimated Creatinine Clearance: 71.3 mL/min (by C-G formula based on SCr of 0.75 mg/dL). Liver Function Tests: Recent Labs  Lab 11/28/20  2149 11/29/20 0103 12/01/20 0759  AST 27 24 24   ALT 27 25 26   ALKPHOS 89 84 71  BILITOT 1.8* 1.8* 0.7  PROT 7.2 7.1 6.3*  ALBUMIN 2.5* 2.5* 2.2*   No results for input(s): LIPASE, AMYLASE in the last 168 hours. No results for input(s): AMMONIA in the last 168 hours. Coagulation Profile: No results for input(s): INR, PROTIME in the last 168 hours. Cardiac Enzymes: No results for input(s): CKTOTAL, CKMB, CKMBINDEX, TROPONINI in the last 168 hours. BNP (last 3 results) No results for input(s): PROBNP in the last 8760 hours. HbA1C: Recent Labs    11/29/20 0103  HGBA1C 5.5   CBG: Recent Labs  Lab 11/30/20 2001 11/30/20 2323 12/01/20 0414 12/01/20 0714 12/01/20 1216  GLUCAP 155* 199* 266* 205* 316*   Lipid Profile: Recent Labs    11/29/20 0154  CHOL 87  HDL 18*  LDLCALC 59   TRIG 52  CHOLHDL 4.8   Thyroid Function Tests: No results for input(s): TSH, T4TOTAL, FREET4, T3FREE, THYROIDAB in the last 72 hours. Anemia Panel: No results for input(s): VITAMINB12, FOLATE, FERRITIN, TIBC, IRON, RETICCTPCT in the last 72 hours. Urine analysis:    Component Value Date/Time   COLORURINE AMBER (A) 11/29/2020 0026   APPEARANCEUR CLEAR 11/29/2020 0026   LABSPEC >1.046 (H) 11/29/2020 0026   PHURINE 6.0 11/29/2020 0026   GLUCOSEU NEGATIVE 11/29/2020 0026   HGBUR NEGATIVE 11/29/2020 0026   BILIRUBINUR NEGATIVE 11/29/2020 0026   KETONESUR NEGATIVE 11/29/2020 0026   PROTEINUR NEGATIVE 11/29/2020 0026   NITRITE NEGATIVE 11/29/2020 0026   LEUKOCYTESUR NEGATIVE 11/29/2020 0026   Recent Results (from the past 240 hour(s))  Resp Panel by RT-PCR (Flu A&B, Covid) Nasopharyngeal Swab     Status: None   Collection Time: 11/28/20 10:03 PM   Specimen: Nasopharyngeal Swab; Nasopharyngeal(NP) swabs in vial transport medium  Result Value Ref Range Status   SARS Coronavirus 2 by RT PCR NEGATIVE NEGATIVE Final    Comment: (NOTE) SARS-CoV-2 target nucleic acids are NOT DETECTED.  The SARS-CoV-2 RNA is generally detectable in upper respiratory specimens during the acute phase of infection. The lowest concentration of SARS-CoV-2 viral copies this assay can detect is 138 copies/mL. A negative result does not preclude SARS-Cov-2 infection and should not be used as the sole basis for treatment or other patient management decisions. A negative result may occur with  improper specimen collection/handling, submission of specimen other than nasopharyngeal swab, presence of viral mutation(s) within the areas targeted by this assay, and inadequate number of viral copies(<138 copies/mL). A negative result must be combined with clinical observations, patient history, and epidemiological information. The expected result is Negative.  Fact Sheet for Patients:   EntrepreneurPulse.com.au  Fact Sheet for Healthcare Providers:  IncredibleEmployment.be  This test is no t yet approved or cleared by the [place] FDA and  has been authorized for detection and/or diagnosis of SARS-CoV-2 by FDA under an Emergency Use Authorization (EUA). This EUA will remain  in effect (meaning this test can be used) for the duration of the COVID-19 declaration under Section 564(b)(1) of the Act, 21 U.S.C.section 360bbb-3(b)(1), unless the authorization is terminated  or revoked sooner.       Influenza A by PCR NEGATIVE NEGATIVE Final   Influenza B by PCR NEGATIVE NEGATIVE Final    Comment: (NOTE) The Xpert Xpress SARS-CoV-2/FLU/RSV plus assay is intended as an aid in the diagnosis of influenza from Nasopharyngeal swab specimens and should not be used as a sole basis for  treatment. Nasal washings and aspirates are unacceptable for Xpert Xpress SARS-CoV-2/FLU/RSV testing.  Fact Sheet for Patients: EntrepreneurPulse.com.au  Fact Sheet for Healthcare Providers: IncredibleEmployment.be  This test is not yet approved or cleared by the [place] FDA and has been authorized for detection and/or diagnosis of SARS-CoV-2 by FDA under an Emergency Use Authorization (EUA). This EUA will remain in effect (meaning this test can be used) for the duration of the COVID-19 declaration under Section 564(b)(1) of the Act, 21 U.S.C. section 360bbb-3(b)(1), unless the authorization is terminated or revoked.  Performed at [facility], [place] [address], [place], [place] [address]   Culture, blood (routine x 2) Call MD if unable to obtain prior to antibiotics being given     Status: None (Preliminary result)   Collection Time: 11/29/20  1:03 AM   Specimen: BLOOD  Result Value Ref Range Status   Specimen Description   Final    BLOOD RIGHT ANTECUBITAL Performed at [place] [address], [place], [place] [address]    Special Requests   Final    BOTTLES DRAWN AEROBIC ONLY Blood Culture results may not be optimal due to an excessive volume of blood received in culture bottles Performed at [place] [address], [place], [place] [address]    Culture   Final    NO GROWTH 1 DAY Performed at [facility], [place] [address], [place], [place] [address]    Report Status PENDING  Incomplete  Culture, blood (routine x 2) Call MD if unable to obtain prior to antibiotics being given     Status: None (Preliminary result)   Collection Time: 11/29/20  1:03 AM   Specimen: BLOOD RIGHT HAND  Result Value Ref Range Status   Specimen Description   Final    BLOOD RIGHT HAND Performed at [place] [address] [place], [place] [address]    Special Requests   Final    BOTTLES DRAWN AEROBIC ONLY Blood Culture results may not be optimal due to an excessive volume of blood received in culture bottles Performed at [facility] [address] [place], [place] [address]    Culture   Final    NO GROWTH 1 DAY Performed at [facility], [place] [address], [place],  [address]    Report Status PENDING  Incomplete      Radiology Studies: DG Swallowing Func-Speech Pathology  Result Date: 11/30/2020 Objective Swallowing Evaluation: Type of Study: MBS-Modified Barium Swallow Study  Patient Details Name: Geoffrey Wood MRN: [id number] Date of Birth: 19-Mar-1946 Today's Date: 11/30/2020 Time: SLP Start Time (ACUTE ONLY): 1045 -SLP Stop Time (ACUTE ONLY): 1115 SLP Time Calculation (min) (ACUTE ONLY): 30 min Past Medical History: Past Medical History: Diagnosis Date . Adenomatous colon polyp  . Cataract  . Diabetes mellitus  . Emphysema  . Emphysema of lung ([place])  . Erectile dysfunction  . Hyperlipidemia  . Hypertension  . Peripheral vascular disease ([place])  . Pneumonia  . Pulmonary nodule   Past Surgical History: Past Surgical History: Procedure Laterality Date . ABDOMINAL AORTIC ANEURYSM REPAIR  2004 . COLONOSCOPY  multiple since 2004 HPI: GEE HABIG is a 75 y.o. male with a history of COPD, IPF recently started on 4L O2 at home, [facility] of right parotid s/p excision with chronic wound, ITP, HTN, and hard of hearing who presented to the ED late 4/14 for progressive shortness of breath and hypoxia to  70-80%'s despite starting 4L O2 at home. CT Chest shows "Severe centrilobular and paraseptal emphysema, more  severe within the apices bilaterally. Since the prior examination,  there has developed extensive bibasilar ground-glass pulmonary  infiltrate and pulmonary consolidation, more severe within the right  lower lobe. Small right pleural effusion has developed. 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 On 06-03-20 Dr. Nicolette Bang performed rt parotidectomy with facial nerve dissection which ultimately req'd sacrifice of upper division of rt facial nerve. Rt selective neck dissection also that date.  Pt had three sessions of OP SLP therapy due to facial nerve paresis. Per ENT "He also recieved postoperative radiation therapy but did not f/u with SLP therapy after that.  Since that time he is always had some hearing problems and wears hearing aids but his hearing in his right ear has been worse.  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  No data recorded Assessment / Plan / Recommendation CHL IP CLINICAL IMPRESSIONS 11/30/2020 Clinical Impression Pt demonstrates a mild to moderate dysphagia with trace to mild sensed aspiration events that now pt reports are new over the past few days.  Oral deficits include  anterior loss during chin tuck, base of tongue weakness, likely on the right leading to mild premature spillage during bolus formation with trace penetration events before the swallow and one instance of significant aspiration with hard cough. Pt also has decreased laryngeal closure during the swallow though epiglottic deflection and laryngal elevation with glottic clousre are quite complete and sustained in many attempts. Suspect decreased seal of arytenoid tissue leading to penetration during the swallow and eventual trace aspiration as glottic closure is released. Pt typically senses this with a throat clear or cough, though not all aspirate is cleared. Nectar thick liquids and puree are tolerated without aspiration and a cue for an effortful swallow eliminates mild vallecular residue that was otherwise present. Generally, this dysphagia is mild and possibly temporary if pt can improve conditioning and general strength and pulmonary hygiene. Recommend puree (pts baseline as he does not masticate solids since surgery)and nectar thick liquids during acute phase of rehabilitation. Will benefit from RMST, training for preventative cough strategy and strengthning for increased laryngeal closure as well as preventative oral hygiene measures. SLP Visit Diagnosis Dysphagia, oropharyngeal phase (R13.12) Attention and concentration deficit following -- Frontal lobe and executive function deficit following -- Impact on safety and function Moderate aspiration risk;Risk for inadequate nutrition/hydration   CHL IP TREATMENT RECOMMENDATION 11/30/2020 Treatment Recommendations Therapy as outlined in treatment plan below   Prognosis 11/30/2020 Prognosis for Safe Diet Advancement Good Barriers to Reach Goals -- Barriers/Prognosis Comment -- CHL IP DIET RECOMMENDATION 11/30/2020 SLP Diet Recommendations Dysphagia 1 (Puree) solids;Nectar thick liquid Liquid Administration via Straw Medication Administration Whole meds with liquid  Compensations Slow rate;Small sips/bites;Effortful swallow;Clear throat after each swallow Postural Changes --   No flowsheet data found.  CHL IP FOLLOW UP RECOMMENDATIONS 11/30/2020 Follow up Recommendations Inpatient Rehab   CHL IP FREQUENCY AND DURATION 11/30/2020 Speech Therapy Frequency (ACUTE ONLY) min 2x/week Treatment Duration 2 weeks      CHL IP ORAL PHASE 11/30/2020 Oral Phase Impaired Oral - Pudding Teaspoon -- Oral - Pudding Cup -- Oral - Honey Teaspoon -- Oral -  Honey Cup -- Oral - Nectar Teaspoon -- Oral - Nectar Cup -- Oral - Nectar Straw Premature spillage Oral - Thin Teaspoon -- Oral - Thin Cup -- Oral - Thin Straw Premature spillage;Right anterior bolus loss Oral - Puree WFL Oral - Mech Soft -- Oral - Regular -- Oral - Multi-Consistency -- Oral - Pill -- Oral Phase - Comment --  CHL IP PHARYNGEAL PHASE 11/30/2020 Pharyngeal Phase Impaired Pharyngeal- Pudding Teaspoon -- Pharyngeal -- Pharyngeal- Pudding Cup -- Pharyngeal -- Pharyngeal- Honey Teaspoon -- Pharyngeal -- Pharyngeal- Honey Cup -- Pharyngeal -- Pharyngeal- Nectar Teaspoon -- Pharyngeal -- Pharyngeal- Nectar Cup -- Pharyngeal -- Pharyngeal- Nectar Straw Reduced epiglottic inversion;Reduced tongue base retraction;Pharyngeal residue - valleculae Pharyngeal -- Pharyngeal- Thin Teaspoon -- Pharyngeal -- Pharyngeal- Thin Cup -- Pharyngeal -- Pharyngeal- Thin Straw Trace aspiration;Penetration/Aspiration before swallow;Penetration/Aspiration during swallow;Reduced airway/laryngeal closure;Reduced epiglottic inversion Pharyngeal -- Pharyngeal- Puree Pharyngeal residue - valleculae Pharyngeal -- Pharyngeal- Mechanical Soft -- Pharyngeal -- Pharyngeal- Regular -- Pharyngeal -- Pharyngeal- Multi-consistency -- Pharyngeal -- Pharyngeal- Pill -- Pharyngeal -- Pharyngeal Comment --  No flowsheet data found. DeBlois, Katherene Ponto 11/30/2020, 11:55 AM              ECHOCARDIOGRAM COMPLETE  Result Date: 11/29/2020    ECHOCARDIOGRAM REPORT   Patient  Name:   Geoffrey Wood Date of Exam: 11/29/2020 Medical Rec #:  [id number]       Height:       72.0 in Accession #:    [id number]      Weight:       143.8 lb Date of Birth:  09/14/1945        BSA:          1.852 m Patient Age:    16 years        BP:           94/54 mmHg Patient Gender: M               HR:           65 bpm. Exam Location:  Inpatient Procedure: 2D Echo, Cardiac Doppler and Color Doppler Indications:    COPD  History:        Patient has prior history of Echocardiogram examinations, most                 recent 08/26/2017. Risk Factors:Dyslipidemia, Hypertension and                 Diabetes.  Sonographer:    Luisa Hart RDCS Referring Phys: [place]  1. Left ventricular ejection fraction, by estimation, is 65 to 70%. The left ventricle has normal function. The left ventricle has no regional wall motion abnormalities. Left ventricular diastolic parameters were normal.  2. Right ventricular systolic function is low normal. The right ventricular size is moderately enlarged. There is mildly elevated pulmonary artery systolic pressure.  3. Left atrial size was mild to moderately dilated.  4. Right atrial size was mild to moderately dilated.  5. The mitral valve is grossly normal. Trivial mitral valve regurgitation.  6. The aortic valve is tricuspid. There is mild calcification of the aortic valve. There is mild thickening of the aortic valve. Aortic valve regurgitation is not visualized. Mild aortic valve sclerosis is present, with no evidence of aortic valve stenosis.  7. The inferior vena cava is dilated in size with <50% respiratory variability, suggesting right atrial pressure of 15 mmHg. Comparison(s): Prior images unable to be directly viewed, comparison  made by report only. Conclusion(s)/Recommendation(s): RV moderately enlarged, with mildly elevated RVSP. Normal LVEF. FINDINGS  Left Ventricle: Left ventricular ejection fraction, by estimation, is 65 to 70%. The left ventricle  has normal function. The left ventricle has no regional wall motion abnormalities. The left ventricular internal cavity size was normal in size. There is  borderline left ventricular hypertrophy. Left ventricular diastolic parameters were normal. Right Ventricle: The right ventricular size is moderately enlarged. Right vetricular wall thickness was not well visualized. Right ventricular systolic function is low normal. There is mildly elevated pulmonary artery systolic pressure. The tricuspid regurgitant velocity is 2.57 m/s, and with an assumed right atrial pressure of 15 mmHg, the estimated right ventricular systolic pressure is 41.3 mmHg. Left Atrium: Left atrial size was mild to moderately dilated. Right Atrium: Right atrial size was mild to moderately dilated. Pericardium: There is no evidence of pericardial effusion. Mitral Valve: The mitral valve is grossly normal. Trivial mitral valve regurgitation. Tricuspid Valve: The tricuspid valve is normal in structure. Tricuspid valve regurgitation is mild. Aortic Valve: The aortic valve is tricuspid. There is mild calcification of the aortic valve. There is mild thickening of the aortic valve. Aortic valve regurgitation is not visualized. Mild aortic valve sclerosis is present, with no evidence of aortic valve stenosis. Aortic valve mean gradient measures 6.0 mmHg. Aortic valve peak gradient measures 11.8 mmHg. Aortic valve area, by VTI measures 2.51 cm. Pulmonic Valve: The pulmonic valve was grossly normal. Pulmonic valve regurgitation is not visualized. No evidence of pulmonic stenosis. Aorta: The aortic root and ascending aorta are structurally normal, with no evidence of dilitation. Venous: The inferior vena cava is dilated in size with less than 50% respiratory variability, suggesting right atrial pressure of 15 mmHg. IAS/Shunts: The atrial septum is grossly normal.  LEFT VENTRICLE PLAX 2D LVIDd:         4.66 cm     Diastology LVIDs:         2.88 cm     LV e'  medial:    7.72 cm/s LV PW:         1.20 cm     LV E/e' medial:  6.0 LV IVS:        0.70 cm     LV e' lateral:   10.40 cm/s LVOT diam:     2.60 cm     LV E/e' lateral: 4.5 LV SV:         100 LV SV Index:   54 LVOT Area:     5.31 cm  LV Volumes (MOD) LV vol d, MOD A2C: 85.5 ml LV vol s, MOD A2C: 28.9 ml LV SV MOD A2C:     56.6 ml RIGHT VENTRICLE RV S prime:     10.10 cm/s  PULMONARY VEINS TAPSE (M-mode): 1.6 cm      A Reversal Duration: 77.00 msec                             A Reversal Velocity: 16.80 cm/s                             Diastolic Velocity:  24.40 cm/s                             S/D Velocity:        1.40  Systolic Velocity:   56.31 cm/s AORTIC VALVE                    PULMONIC VALVE AV Area (Vmax):    2.58 cm     PV Vmax:       0.94 m/s AV Area (Vmean):   2.81 cm     PV Vmean:      79.900 cm/s AV Area (VTI):     2.51 cm     PV VTI:        0.263 m AV Vmax:           172.00 cm/s  PV Peak grad:  3.5 mmHg AV Vmean:          113.000 cm/s PV Mean grad:  3.0 mmHg AV VTI:            0.400 m AV Peak Grad:      11.8 mmHg AV Mean Grad:      6.0 mmHg LVOT Vmax:         83.50 cm/s LVOT Vmean:        59.900 cm/s LVOT VTI:          0.189 m LVOT/AV VTI ratio: 0.47  AORTA Ao Root diam: 4.00 cm Ao Asc diam:  3.70 cm MITRAL VALVE               TRICUSPID VALVE MV Area (PHT): 2.50 cm    TR Peak grad:   26.4 mmHg MV Decel Time: 303 msec    TR Vmax:        257.00 cm/s MV E velocity: 46.70 cm/s MV A velocity: 83.90 cm/s  SHUNTS MV E/A ratio:  0.56        Systemic VTI:  0.19 m                            Systemic Diam: 2.60 cm Buford Dresser MD Electronically signed by Buford Dresser MD Signature Date/Time: 11/29/2020/6:11:02 PM    Final     Scheduled Meds: . feeding supplement (KATE FARMS STANDARD 1.4)  325 mL Oral TID BM  . insulin aspart  0-9 Units Subcutaneous Q4H  . ipratropium-albuterol  3 mL Nebulization TID  . multivitamin with minerals  1 tablet Oral Daily  .  pantoprazole  40 mg Oral Daily  . predniSONE  60 mg Oral Q breakfast  . protein supplement  1 Scoop Oral TID WC  . sodium chloride flush  3 mL Intravenous Q12H   Continuous Infusions: . azithromycin 500 mg (12/01/20 0111)  . cefTRIAXone (ROCEPHIN)  IV 2 g (12/01/20 0028)     LOS: 2 days   Time spent: 35 minutes.  Patrecia Pour, MD [facility] [email or web address] 12/01/2020, 12:23 PM

## 2020-12-01 NOTE — Progress Notes (Signed)
Called lab to notify them of pts STAT blood glucose order.

## 2020-12-01 NOTE — Progress Notes (Signed)
SLP Cancellation Note  Patient Details Name: Geoffrey Wood MRN: [id number] DOB: Aug 16, 1946   Cancelled treatment:       Reason Eval/Treat Not Completed: Pt was encountered asleep with simple oxygen mask in place.  SLP will f/u as schedule allows.    Elvia Collum Jolyssa Oplinger 12/01/2020, 2:50 PM

## 2020-12-01 NOTE — Progress Notes (Signed)
Patient's BP is 95/46 and MAP is 60. Patient was asleep at the time VS were taken. NP Blount was notified.

## 2020-12-02 DIAGNOSIS — J441 Chronic obstructive pulmonary disease with (acute) exacerbation: Secondary | ICD-10-CM

## 2020-12-02 DIAGNOSIS — E43 Unspecified severe protein-calorie malnutrition: Secondary | ICD-10-CM

## 2020-12-02 DIAGNOSIS — D61818 Other pancytopenia: Secondary | ICD-10-CM | POA: Diagnosis not present

## 2020-12-02 DIAGNOSIS — J84112 Idiopathic pulmonary fibrosis: Secondary | ICD-10-CM | POA: Diagnosis not present

## 2020-12-02 DIAGNOSIS — Z85818 Personal history of malignant neoplasm of other sites of lip, oral cavity, and pharynx: Secondary | ICD-10-CM | POA: Diagnosis not present

## 2020-12-02 DIAGNOSIS — I2510 Atherosclerotic heart disease of native coronary artery without angina pectoris: Secondary | ICD-10-CM | POA: Diagnosis not present

## 2020-12-02 DIAGNOSIS — E119 Type 2 diabetes mellitus without complications: Secondary | ICD-10-CM | POA: Diagnosis not present

## 2020-12-02 DIAGNOSIS — J189 Pneumonia, unspecified organism: Secondary | ICD-10-CM | POA: Diagnosis not present

## 2020-12-02 DIAGNOSIS — I10 Essential (primary) hypertension: Secondary | ICD-10-CM | POA: Diagnosis not present

## 2020-12-02 LAB — CBC WITH DIFFERENTIAL/PLATELET
Abs Immature Granulocytes: 0.6 10*3/uL — ABNORMAL HIGH (ref 0.00–0.07)
Basophils Absolute: 0 10*3/uL (ref 0.0–0.1)
Basophils Relative: 1 %
Eosinophils Absolute: 0 10*3/uL (ref 0.0–0.5)
Eosinophils Relative: 1 %
HCT: 27.8 % — ABNORMAL LOW (ref 39.0–52.0)
Hemoglobin: 8.4 g/dL — ABNORMAL LOW (ref 13.0–17.0)
Immature Granulocytes: 14 %
Lymphocytes Relative: 13 %
Lymphs Abs: 0.6 10*3/uL — ABNORMAL LOW (ref 0.7–4.0)
MCH: 25.1 pg — ABNORMAL LOW (ref 26.0–34.0)
MCHC: 30.2 g/dL (ref 30.0–36.0)
MCV: 83.2 fL (ref 80.0–100.0)
Monocytes Absolute: 0.4 10*3/uL (ref 0.1–1.0)
Monocytes Relative: 9 %
Neutro Abs: 2.7 10*3/uL (ref 1.7–7.7)
Neutrophils Relative %: 62 %
Platelets: 62 10*3/uL — ABNORMAL LOW (ref 150–400)
RBC: 3.34 MIL/uL — ABNORMAL LOW (ref 4.22–5.81)
RDW: 27.9 % — ABNORMAL HIGH (ref 11.5–15.5)
WBC: 4.3 10*3/uL (ref 4.0–10.5)
nRBC: 20.7 % — ABNORMAL HIGH (ref 0.0–0.2)

## 2020-12-02 LAB — GLUCOSE, CAPILLARY
Glucose-Capillary: 149 mg/dL — ABNORMAL HIGH (ref 70–99)
Glucose-Capillary: 299 mg/dL — ABNORMAL HIGH (ref 70–99)
Glucose-Capillary: 314 mg/dL — ABNORMAL HIGH (ref 70–99)
Glucose-Capillary: 279 mg/dL — ABNORMAL HIGH (ref 70–99)

## 2020-12-02 LAB — BASIC METABOLIC PANEL
Anion gap: 7 (ref 5–15)
BUN: 23 mg/dL (ref 8–23)
CO2: 28 mmol/L (ref 22–32)
Calcium: 8 mg/dL — ABNORMAL LOW (ref 8.9–10.3)
Chloride: 101 mmol/L (ref 98–111)
Creatinine, Ser: 0.56 mg/dL — ABNORMAL LOW (ref 0.61–1.24)
GFR, Estimated: >60 mL/min (ref >60)
Glucose, Bld: 167 mg/dL — ABNORMAL HIGH (ref 70–99)
Potassium: 3.4 mmol/L — ABNORMAL LOW (ref 3.5–5.1)
Sodium: 136 mmol/L (ref 135–145)

## 2020-12-02 LAB — PROTIME-INR
INR: 1.2 (ref 0.8–1.2)
Prothrombin Time: 15.2 seconds (ref 11.4–15.2)

## 2020-12-02 LAB — ABO/RH: ABO/RH(D): A POS

## 2020-12-02 LAB — APTT: aPTT: 29 seconds (ref 24–36)

## 2020-12-02 LAB — FIBRINOGEN: Fibrinogen: 416 mg/dL (ref 210–475)

## 2020-12-02 MED ORDER — INSULIN ASPART 100 UNIT/ML ~~LOC~~ SOLN
5.0000 [IU] | Freq: Three times a day (TID) | SUBCUTANEOUS | Status: DC
  Administered 2020-12-02 (×2): 5 [IU] via SUBCUTANEOUS

## 2020-12-02 MED ORDER — POTASSIUM CHLORIDE CRYS ER 20 MEQ PO TBCR
40.0000 meq | EXTENDED_RELEASE_TABLET | Freq: Once | ORAL | Status: AC
  Administered 2020-12-02: 40 meq via ORAL
  Filled 2020-12-02: qty 2

## 2020-12-02 MED ORDER — FUROSEMIDE 10 MG/ML IJ SOLN
40.0000 mg | Freq: Once | INTRAMUSCULAR | Status: AC
  Administered 2020-12-02: 40 mg via INTRAVENOUS
  Filled 2020-12-02: qty 4

## 2020-12-02 NOTE — Progress Notes (Signed)
Occupational Therapy Treatment Patient Details Name: Geoffrey Wood MRN: [id number] DOB: 12/27/1945 Today's Date: 12/02/2020    History of present illness 75 yo presents to [facility] on 4/14 with shortness of breath x1 week. CTA negative for PE, positive for infiltrates and consolidation concerning for PNA. Workup also for COPD exacerbation. 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   OT comments  Pt completed sponge bathing with assist for his back only and dressed with set up to supervision. Urinated and groomed in standing with supervision.   Follow Up Recommendations  No OT follow up;Supervision - Intermittent    Equipment Recommendations  None recommended by OT    Recommendations for Other Services      Precautions / Restrictions Precautions Precautions: Fall Precaution Comments: monitor sats       Mobility Bed Mobility               General bed mobility comments: in chair at arrival    Transfers Overall transfer level: Needs assistance Equipment used: None Transfers: Sit to/from Stand Sit to Stand: Supervision         General transfer comment: for safety and lines    Balance Overall balance assessment: Needs assistance   Sitting balance-Leahy Scale: Normal       Standing balance-Leahy Scale: Fair Standing balance comment: no LOB with ambulation to bathroom or standing at chair                           ADL either performed or assessed with clinical judgement   ADL Overall ADL's : Needs assistance/impaired     Grooming: Set up;Sitting;Oral care;Wash/dry hands;Wash/dry face   Upper Body Bathing: Minimal assistance Upper Body Bathing Details (indicate cue type and reason): assisted with his back Lower Body Bathing: Supervison/ safety;Sit to/from stand   Upper Body Dressing : Set up;Sitting   Lower Body Dressing: Supervision/safety;Sit  to/from stand Lower Body Dressing Details (indicate cue type and reason): donned underwear Toilet Transfer: Supervision/safety;Ambulation Toilet Transfer Details (indicate cue type and reason): urinated in standing Toileting- Clothing Manipulation and Hygiene: Supervision/safety       Functional mobility during ADLs: Supervision/safety General ADL Comments: on 6L until MD came in and decreased 02 to 4L with sats at 98%     Vision       Perception     Praxis      Cognition Arousal/Alertness: Awake/alert Behavior During Therapy: WFL for tasks assessed/performed Overall Cognitive Status: Within Functional Limits for tasks assessed                                 General Comments: very [facility]        Exercises     Shoulder Instructions       General Comments      Pertinent Vitals/ Pain       Pain Assessment: No/denies pain  Home Living                                          Prior Functioning/Environment              Frequency  Min 2X/week        Progress Toward Goals  OT Goals(current goals  can now be found in the care plan section)  Progress towards OT goals: Progressing toward goals  Acute Rehab OT Goals Patient Stated Goal: feel better, back to golf OT Goal Formulation: With patient Time For Goal Achievement: 12/13/20 Potential to Achieve Goals: Good  Plan Discharge plan remains appropriate    Co-evaluation                 AM-PAC OT "6 Clicks" Daily Activity     Outcome Measure   Help from another person eating meals?: None Help from another person taking care of personal grooming?: A Little Help from another person toileting, which includes using toliet, bedpan, or urinal?: A Little Help from another person bathing (including washing, rinsing, drying)?: A Little Help from another person to put on and taking off regular upper body clothing?: None Help from another person to put on and taking off  regular lower body clothing?: A Little 6 Click Score: 20    End of Session Equipment Utilized During Treatment: Oxygen  OT Visit Diagnosis: Other abnormalities of gait and mobility (R26.89)   Activity Tolerance Patient tolerated treatment well   Patient Left in chair;with call bell/phone within reach   Nurse Communication Other (comment) (02 turned down to 4L at MDs request)        Time: 7846-9629 OT Time Calculation (min): 29 min  Charges: OT General Charges $OT Visit: 1 Visit OT Treatments $Self Care/Home Management : 23-37 mins  Nestor Lewandowsky, OTR/L Acute Rehabilitation Services Pager: [phone] Office: [phone]  Malka So 12/02/2020, 9:52 AM

## 2020-12-02 NOTE — Progress Notes (Signed)
Physical Therapy Treatment Patient Details Name: Geoffrey Wood MRN: [id number] DOB: May 23, 1946 Today's Date: 12/02/2020    History of Present Illness 75 yo presents to [facility] on 4/14 with shortness of breath x1 week. CTA negative for PE, positive for infiltrates and consolidation concerning for PNA. Workup also for COPD exacerbation. 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    PT Comments    Pt assisted with ambulating in hallway and SpO2 dropped to 83% ambulating on 4L so required 6L upon returning to room.    Follow Up Recommendations  Supervision for mobility/OOB;Home health PT     Equipment Recommendations  None recommended by PT    Recommendations for Other Services       Precautions / Restrictions Precautions Precautions: Fall Precaution Comments: monitor sats, HOH    Mobility  Bed Mobility               General bed mobility comments: pt in recliner on arrival    Transfers Overall transfer level: Needs assistance Equipment used: Rolling walker (2 wheeled) Transfers: Sit to/from Stand Sit to Stand: Supervision         General transfer comment: for safety and lines  Ambulation/Gait Ambulation/Gait assistance: Min guard Gait Distance (Feet): 200 Feet Assistive device: Rolling walker (2 wheeled) Gait Pattern/deviations: Step-through pattern;Decreased stride length     General Gait Details: Supervision for safety; cues for deep breathing/lung expansion as able.  Pt was on 4L O2 mask (states mouth breather prefers mask) for ambulation and SPO2 dropped to 83% so increased to 6L and SPO2 93% upon returning to room   Stairs             Wheelchair Mobility    Modified Rankin (Stroke Patients Only)       Balance Overall balance assessment: Needs assistance   Sitting balance-Leahy Scale: Normal       Standing balance-Leahy Scale:  Fair Standing balance comment: no LOB with ambulation to bathroom or standing at chair                            Cognition Arousal/Alertness: Awake/alert Behavior During Therapy: WFL for tasks assessed/performed Overall Cognitive Status: Within Functional Limits for tasks assessed                                 General Comments: very [facility]      Exercises      General Comments        Pertinent Vitals/Pain Pain Assessment: No/denies pain    Home Living                      Prior Function            PT Goals (current goals can now be found in the care plan section) Acute Rehab PT Goals Patient Stated Goal: feel better, back to golf Progress towards PT goals: Progressing toward goals    Frequency    Min 3X/week      PT Plan Current plan remains appropriate    Co-evaluation              AM-PAC PT "6 Clicks" Mobility   Outcome Measure  Help needed turning from your back to your side while in a flat bed without using bedrails?: None Help needed  moving from lying on your back to sitting on the side of a flat bed without using bedrails?: None Help needed moving to and from a bed to a chair (including a wheelchair)?: A Little Help needed standing up from a chair using your arms (e.g., wheelchair or bedside chair)?: A Little Help needed to walk in hospital room?: A Little Help needed climbing 3-5 steps with a railing? : A Little 6 Click Score: 20    End of Session Equipment Utilized During Treatment: Oxygen Activity Tolerance: Patient tolerated treatment well Patient left: in chair;with call bell/phone within reach   PT Visit Diagnosis: Other abnormalities of gait and mobility (R26.89);Difficulty in walking, not elsewhere classified (R26.2)     Time: 0160-1093 PT Time Calculation (min) (ACUTE ONLY): 12 min  Charges:  $Gait Training: 8-22 mins                    Jannette Spanner PT, DPT Acute Rehabilitation Services Pager:  [phone] Office: [phone]   [name] E 12/02/2020, 12:41 PM

## 2020-12-02 NOTE — Progress Notes (Signed)
PROGRESS NOTE  Geoffrey Wood  MVH:[id number] DOB: Sep 09, 1945 DOA: 11/28/2020 PCP: Crist Infante, MD  Outpatient Specialists: Oncology, Dr. Irene Limbo; ENT, Dr. Lucia Gaskins; Pulmonary, Dr. Vaughan Browner Brief Narrative: Geoffrey Wood is a 75 y.o. male with a history of severe emphysema, IPF recently started on 4L O2 at home, [facility] of right parotid s/p excision and radiation with chronic wound, ITP, HTN, and hard of hearing who presented to the ED late 4/14 for progressive shortness of breath and hypoxia to 70-80%'s despite starting 4L O2 at home. He was confirmed to be hypoxic, due to mouth breathing, placed on simple mask with improved oxygenation. CXR demonstrated chronic interstial lung disease/fibrosis which appeared stable from prior HRCT. Subsequent CTA chest revealed extensive bibasilar infiltrate/consolidations with small right parapneumonic effusion with extensive mediastinal adenopathy. No PE, severe emphysema. Antibiotics were given and patient admitted. Pulmonary was consulted, started patient on prednisone 1mg /kg 4/17. CBC reveals pancytopenia for which hematology input was requested, suspecting bone marrow abnormality, e.g. MDS. No transfusions have been required thus far. Shortness of breath has improved though severe hypoxemia remains.   Assessment & Plan: Active Problems:   HTN (hypertension)   Hyperlipidemia   Coronary artery calcification   Immune thrombocytopenia (HCC)   IPF (idiopathic pulmonary fibrosis) (HCC)   COPD exacerbation (HCC)   CAP (community acquired pneumonia)   Cellulitis   Protein-calorie malnutrition, severe  Acute on chronic hypoxic respiratory failure: Due to bibasilar pneumonia, suspected ILD flare, COPD exacerbation, pleural effusion on baseline severe emphysema, IPF. No PE on CTA. Covid and flu negative.  - Started prednisone 1 mg/kg daily per PCCM recommendations on 4/16.  - Continue typical abx (CTX, azithromycin) x5 days. PCT reassuring, WBC normal.  - Dysphagia 1 diet  per SLP. - Monitor blood cultures (NG3D), sputum cultures, broader viral respiratory panel (some preceding URI symptoms) both still pending/sent. - Continue bronchodilators scheduled duoneb and prn albuterol - BNP grossly elevated with cardiomegaly, particularly RV enlargement. Echo shows RV enlargement and elevated right-sided pressures consistent with developing cor pulmonale. normal LV. Repeat lasix 4/18.  - Repeat CT recommended in 3 months to confirm resolution of infiltrates, effusion, and adenopathy.   IPF, COPD: Followed by pulmonary, Dr. Vaughan Browner. Held off initiation of treatment at Feb visit with plans to follow up in 6 months. - Plan per pulm as above.  - Palliative care also following patient.  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 Irene Limbo, treated with IVIG in the past.  - Hematology, Dr. Alvy Bimler consulted. 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  - Transfusion threshold for hgb 8g/dl or platelet <50k. Can consider BM aspirate after recovery from this illness.  - VTE ppx has been held since admission.   - Continue monitoring CBC, continue po iron.  Hypokalemia:  - Supplement K especially with need for diuresis.   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  - Follow up with ENT, this was scheduled 4/20 - Has CT soft tissue head/neck scheduled 4/21 with follow up with [name] 4/22.  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 This has not changed and he's told it's unlikely to ever close up. Has continued wound care by his wife diligently.  - Has follow up with Dr. Lucia Gaskins on 4/20. Will defer further management to  him. Do not feel this requires  targeted treatment for cellulitis at this time.  Severe protein-calorie malnutrition:  - Supplement protein as much as possible. Medical record indicates a feeding tube is being considered.   Hypotension: This appears to be a nonacute issue. No evidence of sepsis or brisk large vessel bleeding.  - Caution with diuresis.   Hyperbilirubinemia: Mild, has history of same. Hgb is stable.   Extensive multivessel CAD, PVD, HLD: Noted on CTA. TWI, ST depressions on ECG. - Troponin reassuring x2, no chest pain. LVEF 95-28%, normal diastolic parameters and wall motion. RV enlarged with elevated PASP and RA pressure, moderate biatrial enlargement.   Ascending aortic aneurysm: 4.2cm on CTA. Also s/p AAA repair 2004. - Follow up CTA or MRA recommended annually.  T2DM with steroid-induced hyperglycemia: HbA1c 5.5%  - Added lantus 15u daily, will add mealtime novolog coverage to augmented resistant SSI AC, HS coverage.   DVT prophylaxis: SCD Code Status: Full Family Communication: None at bedside Disposition Plan:  Status is: Inpatient  Remains inpatient appropriate because:Inpatient level of care appropriate due to severity of illness  Dispo: The patient is from: Home              Anticipated d/c is to: Home              Patient currently is not medically stable to d/c.   Difficult to place patient No  Consultants:   Pulmonary  Heme/onc  Palliative care  Procedures:  Echo 11/29/2020  Antimicrobials:  Vancomycin  4/14  Ceftriaxone, azithromycin 4/14 - 4/19  Subjective: Up with therapy today did pretty well, steady, but dropped into low 80%'s on 6L O2 with dyspnea. No chest pain. No further nosebleeding or other bleeding.  Objective: Vitals:   12/02/20 0457 12/02/20 0730 12/02/20 0732 12/02/20 0854  BP: (!) 100/57   (!) 100/47  Pulse: 69   64  Resp: (!) 22   20  Temp: 98.1 F (36.7 C)   97.8 F (36.6 C)  TempSrc:      SpO2: 96% 96% 94% 90%    Intake/Output Summary  (Last 24 hours) at 12/02/2020 1103 Last data filed at 12/02/2020 0500 Gross per 24 hour  Intake 592.34 ml  Output 1050 ml  Net -457.66 ml   Gen: 75 y.o. male in no distress Pulm: Nonlabored breathing at rest, tachypneic with crackles scattered. No wheezes. CV: Regular rate and rhythm. No murmur, rub, or gallop. No JVD, 2+ pitting dependent edema. GI: Abdomen soft, non-tender, non-distended, with normoactive bowel sounds.  Ext: Warm, no deformities Skin: No new rashes, lesions or ulcers on visualized skin. Scattered ecchymoses are stable without palpable purpura.  Neuro: Alert and oriented. HOH, no new focal neurological deficits. Psych: Judgement and insight appear fair. Mood euthymic & affect congruent. Behavior is appropriate.   Data Reviewed: I have personally reviewed following labs and imaging studies  CBC: Recent Labs  Lab 11/28/20 2149 11/28/20 2204 11/29/20 0103 12/01/20 0716 12/02/20 0514  WBC 6.1  --  6.3 3.0* 4.3  NEUTROABS 3.4  --  3.9  --  2.7  HGB 9.6* 9.2* 9.0* 8.4* 8.4*  HCT 30.9* 27.0* 29.1* 28.0* 27.8*  MCV 81.3  --  81.3 82.4 83.2  PLT 82*  --  78* 63* 62*   Basic Metabolic Panel: Recent Labs  Lab 11/28/20 2149 11/28/20 2204 11/29/20 0103 12/01/20 0759 12/01/20 1753 12/02/20 0514  NA 134* 136 131* 135  --  136  K 4.1 4.2 4.1  3.7  --  3.4*  CL 103 102 103 102  --  101  CO2 23  --  20* 25  --  28  GLUCOSE 212* 203* 197* 213* 320* 167*  BUN 24* 23 21 18   --  23  CREATININE 1.05 0.90 0.90 0.75  --  0.56*  CALCIUM 8.4*  --  8.2* 7.9*  --  8.0*  MG 2.1  --   --   --   --   --    GFR: Estimated Creatinine Clearance: 71.3 mL/min (A) (by C-G formula based on SCr of 0.56 mg/dL (L)). Liver Function Tests: Recent Labs  Lab 11/28/20 2149 11/29/20 0103 12/01/20 0759  AST 27 24 24   ALT 27 25 26   ALKPHOS 89 84 71  BILITOT 1.8* 1.8* 0.7  PROT 7.2 7.1 6.3*  ALBUMIN 2.5* 2.5* 2.2*   No results for input(s): LIPASE, AMYLASE in the last 168 hours. No  results for input(s): AMMONIA in the last 168 hours. Coagulation Profile: Recent Labs  Lab 12/02/20 0514  INR 1.2   Cardiac Enzymes: No results for input(s): CKTOTAL, CKMB, CKMBINDEX, TROPONINI in the last 168 hours. BNP (last 3 results) No results for input(s): PROBNP in the last 8760 hours. HbA1C: No results for input(s): HGBA1C in the last 72 hours. CBG: Recent Labs  Lab 12/01/20 1216 12/01/20 1619 12/01/20 1735 12/01/20 2113 12/02/20 0738  GLUCAP 316* 429* 371* 296* 149*   Lipid Profile: No results for input(s): CHOL, HDL, LDLCALC, TRIG, CHOLHDL, LDLDIRECT in the last 72 hours. Thyroid Function Tests: No results for input(s): TSH, T4TOTAL, FREET4, T3FREE, THYROIDAB in the last 72 hours. Anemia Panel: No results for input(s): VITAMINB12, FOLATE, FERRITIN, TIBC, IRON, RETICCTPCT in the last 72 hours. Urine analysis:    Component Value Date/Time   COLORURINE AMBER (A) 11/29/2020 0026   APPEARANCEUR CLEAR 11/29/2020 0026   LABSPEC >1.046 (H) 11/29/2020 0026   PHURINE 6.0 11/29/2020 0026   GLUCOSEU NEGATIVE 11/29/2020 0026   HGBUR NEGATIVE 11/29/2020 0026   BILIRUBINUR NEGATIVE 11/29/2020 0026   KETONESUR NEGATIVE 11/29/2020 0026   PROTEINUR NEGATIVE 11/29/2020 0026   NITRITE NEGATIVE 11/29/2020 0026   LEUKOCYTESUR NEGATIVE 11/29/2020 0026   Recent Results (from the past 240 hour(s))  Resp Panel by RT-PCR (Flu A&B, Covid) Nasopharyngeal Swab     Status: None   Collection Time: 11/28/20 10:03 PM   Specimen: Nasopharyngeal Swab; Nasopharyngeal(NP) swabs in vial transport medium  Result Value Ref Range Status   SARS Coronavirus 2 by RT PCR NEGATIVE NEGATIVE Final    Comment: (NOTE) SARS-CoV-2 target nucleic acids are NOT DETECTED.  The SARS-CoV-2 RNA is generally detectable in upper respiratory specimens during the acute phase of infection. The lowest concentration of SARS-CoV-2 viral copies this assay can detect is 138 copies/mL. A negative result does not  preclude SARS-Cov-2 infection and should not be used as the sole basis for treatment or other patient management decisions. A negative result may occur with  improper specimen collection/handling, submission of specimen other than nasopharyngeal swab, presence of viral mutation(s) within the areas targeted by this assay, and inadequate number of viral copies(<138 copies/mL). A negative result must be combined with clinical observations, patient history, and epidemiological information. The expected result is Negative.  Fact Sheet for Patients:  EntrepreneurPulse.com.au  Fact Sheet for Healthcare Providers:  IncredibleEmployment.be  This test is no t yet approved or cleared by the [place] FDA and  has been authorized for detection and/or diagnosis of SARS-CoV-2 by FDA  under an Emergency Use Authorization (EUA). This EUA will remain  in effect (meaning this test can be used) for the duration of the COVID-19 declaration under Section 564(b)(1) of the Act, 21 U.S.C.section 360bbb-3(b)(1), unless the authorization is terminated  or revoked sooner.       Influenza A by PCR NEGATIVE NEGATIVE Final   Influenza B by PCR NEGATIVE NEGATIVE Final    Comment: (NOTE) The Xpert Xpress SARS-CoV-2/FLU/RSV plus assay is intended as an aid in the diagnosis of influenza from Nasopharyngeal swab specimens and should not be used as a sole basis for treatment. Nasal washings and aspirates are unacceptable for Xpert Xpress SARS-CoV-2/FLU/RSV testing.  Fact Sheet for Patients: EntrepreneurPulse.com.au  Fact Sheet for Healthcare Providers: IncredibleEmployment.be  This test is not yet approved or cleared by the [place] FDA and has been authorized for detection and/or diagnosis of SARS-CoV-2 by FDA under an Emergency Use Authorization (EUA). This EUA will remain in effect (meaning this test can be used) for the  duration of the COVID-19 declaration under Section 564(b)(1) of the Act, 21 U.S.C. section 360bbb-3(b)(1), unless the authorization is terminated or revoked.  Performed at [facility], [place] [address], [place], [place] [address]   Culture, blood (routine x 2) Call MD if unable to obtain prior to antibiotics being given     Status: None (Preliminary result)   Collection Time: 11/29/20  1:03 AM   Specimen: BLOOD  Result Value Ref Range Status   Specimen Description   Final    BLOOD RIGHT ANTECUBITAL Performed at [facility] [address], [place], [place] [address]    Special Requests   Final    BOTTLES DRAWN AEROBIC ONLY Blood Culture results may not be optimal due to an excessive volume of blood received in culture bottles Performed at [facility] [address], [place], [place] [address]    Culture   Final    NO GROWTH 3 DAYS Performed at [facility], [place] [address] [place], [place] [address]    Report Status PENDING  Incomplete  Culture, blood (routine x 2) Call MD if unable to obtain prior to antibiotics being given     Status: None (Preliminary result)   Collection Time: 11/29/20  1:03 AM   Specimen: BLOOD RIGHT HAND  Result Value Ref Range Status   Specimen Description   Final    BLOOD RIGHT HAND Performed at [facility] [address], [place], [place] [address]    Special Requests   Final    BOTTLES DRAWN AEROBIC ONLY Blood Culture results may not be optimal due to an excessive volume of blood received in culture bottles Performed at [facility] [address] [place], [place] [address]    Culture   Final    NO GROWTH 3 DAYS Performed at [facility], [place] [address] [place], [place] [address]    Report Status PENDING  Incomplete      Radiology Studies: DG Swallowing Func-Speech Pathology  Result Date: 11/30/2020 Objective Swallowing Evaluation:  Type of Study: MBS-Modified Barium Swallow Study  Patient Details Name: Geoffrey Wood MRN: [id number] Date of Birth: [birth year] Today's Date: 11/30/2020 Time: SLP Start Time (ACUTE ONLY): 1045 -SLP Stop Time (ACUTE ONLY): 1115 SLP Time Calculation (min) (ACUTE ONLY): 30 min Past Medical History: Past Medical History: Diagnosis Date . Adenomatous colon polyp  . Cataract  . Diabetes mellitus  . Emphysema  . Emphysema  of lung ([place])  . Erectile dysfunction  . Hyperlipidemia  . Hypertension  . Peripheral vascular disease ([place])  . Pneumonia  . Pulmonary nodule  Past Surgical History: Past Surgical History: Procedure Laterality Date . ABDOMINAL AORTIC ANEURYSM REPAIR  2004 . COLONOSCOPY  multiple since 2004 HPI: Geoffrey Wood is a 75 y.o. male with a history of COPD, IPF recently started on 4L O2 at home, [facility] of right parotid s/p excision with chronic wound, ITP, HTN, and hard of hearing who presented to the ED late 4/14 for progressive shortness of breath and hypoxia to 70-80%'s despite starting 4L O2 at home. CT Chest shows "Severe centrilobular and paraseptal emphysema, more  severe within the apices bilaterally. Since the prior examination,  there has developed extensive bibasilar ground-glass pulmonary  infiltrate and pulmonary consolidation, more severe within the right  lower lobe. Small right pleural effusion has developed. 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 On 06-03-20 Dr. Nicolette Bang performed rt parotidectomy with facial nerve dissection which ultimately req'd sacrifice of upper division of rt facial nerve. Rt selective neck dissection also that date.  Pt had three sessions of OP SLP therapy due to facial nerve paresis. Per ENT "He also recieved postoperative  radiation therapy but did not f/u with SLP therapy after that.  Since that time he is always had some hearing problems and wears hearing aids but his hearing in his right ear has been worse.  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  No data recorded Assessment / Plan / Recommendation CHL IP CLINICAL IMPRESSIONS 11/30/2020 Clinical Impression Pt demonstrates a mild to moderate dysphagia with trace to mild sensed aspiration events that now pt reports are new over the past few days.  Oral deficits include anterior loss during chin tuck, base of tongue weakness, likely on the right leading to mild premature spillage during bolus formation with trace penetration events before the swallow and one instance of significant aspiration with hard cough. Pt also has decreased laryngeal closure during the swallow though epiglottic deflection and laryngal elevation with glottic clousre are quite complete and sustained in many attempts. Suspect decreased seal of arytenoid tissue leading to penetration during the swallow and eventual trace aspiration as glottic closure is released. Pt typically senses this with a throat clear or cough, though not all aspirate is cleared. Nectar thick liquids and puree are tolerated without aspiration and a cue for an effortful swallow eliminates mild vallecular residue that was otherwise present. Generally, this dysphagia is mild and possibly temporary if pt can improve conditioning and general strength and pulmonary hygiene. Recommend puree (pts baseline as he does not masticate solids since surgery)and nectar thick liquids during acute phase of rehabilitation. Will benefit from RMST, training for preventative cough strategy and strengthning for increased laryngeal closure as well as preventative oral hygiene measures. SLP Visit Diagnosis Dysphagia, oropharyngeal phase (R13.12) Attention and concentration deficit following -- Frontal lobe  and executive function deficit following -- Impact on safety and function Moderate aspiration risk;Risk for inadequate nutrition/hydration   CHL IP TREATMENT RECOMMENDATION 11/30/2020 Treatment Recommendations Therapy as outlined in treatment plan below   Prognosis 11/30/2020 Prognosis for Safe Diet Advancement Good Barriers to Reach Goals -- Barriers/Prognosis Comment -- CHL  IP DIET RECOMMENDATION 11/30/2020 SLP Diet Recommendations Dysphagia 1 (Puree) solids;Nectar thick liquid Liquid Administration via Straw Medication Administration Whole meds with liquid Compensations Slow rate;Small sips/bites;Effortful swallow;Clear throat after each swallow Postural Changes --   No flowsheet data found.  CHL IP FOLLOW UP RECOMMENDATIONS 11/30/2020 Follow up Recommendations Inpatient Rehab   CHL IP FREQUENCY AND DURATION 11/30/2020 Speech Therapy Frequency (ACUTE ONLY) min 2x/week Treatment Duration 2 weeks      CHL IP ORAL PHASE 11/30/2020 Oral Phase Impaired Oral - Pudding Teaspoon -- Oral - Pudding Cup -- Oral - Honey Teaspoon -- Oral - Honey Cup -- Oral - Nectar Teaspoon -- Oral - Nectar Cup -- Oral - Nectar Straw Premature spillage Oral - Thin Teaspoon -- Oral - Thin Cup -- Oral - Thin Straw Premature spillage;Right anterior bolus loss Oral - Puree WFL Oral - Mech Soft -- Oral - Regular -- Oral - Multi-Consistency -- Oral - Pill -- Oral Phase - Comment --  CHL IP PHARYNGEAL PHASE 11/30/2020 Pharyngeal Phase Impaired Pharyngeal- Pudding Teaspoon -- Pharyngeal -- Pharyngeal- Pudding Cup -- Pharyngeal -- Pharyngeal- Honey Teaspoon -- Pharyngeal -- Pharyngeal- Honey Cup -- Pharyngeal -- Pharyngeal- Nectar Teaspoon -- Pharyngeal -- Pharyngeal- Nectar Cup -- Pharyngeal -- Pharyngeal- Nectar Straw Reduced epiglottic inversion;Reduced tongue base retraction;Pharyngeal residue - valleculae Pharyngeal -- Pharyngeal- Thin Teaspoon -- Pharyngeal -- Pharyngeal- Thin Cup -- Pharyngeal -- Pharyngeal- Thin Straw Trace  aspiration;Penetration/Aspiration before swallow;Penetration/Aspiration during swallow;Reduced airway/laryngeal closure;Reduced epiglottic inversion Pharyngeal -- Pharyngeal- Puree Pharyngeal residue - valleculae Pharyngeal -- Pharyngeal- Mechanical Soft -- Pharyngeal -- Pharyngeal- Regular -- Pharyngeal -- Pharyngeal- Multi-consistency -- Pharyngeal -- Pharyngeal- Pill -- Pharyngeal -- Pharyngeal Comment --  No flowsheet data found. DeBlois, Katherene Ponto 11/30/2020, 11:55 AM               Scheduled Meds: . feeding supplement ([employer] [place] STANDARD 1.4)  325 mL Oral TID BM  . furosemide  40 mg Intravenous Once  . insulin aspart  0-20 Units Subcutaneous TID WC  . insulin aspart  0-5 Units Subcutaneous QHS  . insulin aspart  5 Units Subcutaneous TID WC  . insulin glargine  15 Units Subcutaneous QHS  . ipratropium-albuterol  3 mL Nebulization TID  . multivitamin with minerals  1 tablet Oral Daily  . pantoprazole  40 mg Oral Daily  . potassium chloride  40 mEq Oral Once  . predniSONE  60 mg Oral Q breakfast  . protein supplement  1 Scoop Oral TID WC  . sodium chloride flush  3 mL Intravenous Q12H   Continuous Infusions: . azithromycin 500 mg (12/02/20 0048)  . cefTRIAXone (ROCEPHIN)  IV 2 g (12/02/20 0045)     LOS: 3 days   Time spent: 35 minutes.  Patrecia Pour, MD [facility] [email or web address] 12/02/2020, 11:03 AM

## 2020-12-02 NOTE — Progress Notes (Signed)
Inpatient Diabetes Program Recommendations  AACE/ADA: New Consensus Statement on Inpatient Glycemic Control (2015)  Target Ranges:  Prepandial:   less than 140 mg/dL      Peak postprandial:   less than 180 mg/dL (1-2 hours)      Critically ill patients:  140 - 180 mg/dL   Lab Results  Component Value Date   GLUCAP 149 (H) 12/02/2020   HGBA1C 5.5 11/29/2020    Review of Glycemic Control Results for LYDON, VANSICKLE (MRN [id number]) as of 12/02/2020 10:43  Ref. Range 12/01/2020 07:14 12/01/2020 12:16 12/01/2020 16:19 12/01/2020 17:35 12/01/2020 21:13 12/02/2020 07:38  Glucose-Capillary Latest Ref Range: 70 - 99 mg/dL 205 (H) 316 (H) 429 (H) 371 (H) 296 (H) 149 (H)   Diabetes history: None noted Outpatient Diabetes medications: None Current orders for Inpatient glycemic control:  Novolog resistant tid with meals and HS Lantus 15 units q HS Prednisone 60 mg daily  Inpatient Diabetes Program Recommendations:   May consider adding Novolog 5 units tid with meals (hold if patient eats less than 50% or NPO).  Once steroids tapered, patient will likely not need insulin anymore.   Thanks  Adah Perl, RN, BC-ADM Inpatient Diabetes Coordinator Pager [phone] (8a-5p)

## 2020-12-02 NOTE — Progress Notes (Signed)
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 in general With his acute illness, it is likely that he could have generalized bone marrow suppression that could drop his blood count further Recommend close monitoring of his blood count over the next few days If his hemoglobin drops to less than 8, I recommend 1 unit of blood transfusion He does not need platelet transfusion support right now unless his platelet count is less than 50,000  Due to his severe illness, I do not recommend bone marrow aspirate and biopsy to be done at this point as it is not going to help US guide treatment decision Geoffrey Wood can discuss with the patient and family members when he returns from his vacation whether this would be an appropriate course of action in the future  Based on the trend of his blood count, so far, has been stable He does not need transfusion support today  Nosebleed, resolved It is unusual to see nosebleed when his platelet count is greater than 50,000 Due to his history of malnutrition, I recommend checking coagulation studies tomorrow to make sure that it is not due to malnutrition with synthetic liver dysfunction causing coagulopathy  so far, his coagulation studies are adequate/normal I believe that his recent nosebleed likely triggered by dry nasal passages from oxygen therapy  Severe COPD exacerbation Agree with hospitalist team to continue antibiotics and steroid treatment  Discharge planning We will defer to primary  service Please call if questions arise Assuming he is discharged this week, I will notify Dr. Grier Wood office to schedule early appointment to follow-up on his blood count Geoffrey Lark, MD 12/02/2020 7:34 AM  Subjective:  He is alert, sitting up and receiving breathing treatment He has very poor hearing No further nosebleed No reported bleeding overnight although he appears to have significant bruising throughout  Objective:  Vitals:   12/02/20 0730 12/02/20 0732  BP:    Pulse:    Resp:    Temp:    SpO2: 96% 94%     Intake/Output Summary (Last 24 hours) at 12/02/2020 0734 Last data filed at 12/02/2020 0500 Gross per 24 hour  Intake 828.34 ml  Output 1050 ml  Net -221.66 ml    GENERAL:alert, no distress and comfortable SKIN: Noted diffuse skin bruising  NEURO: alert, very poor hearing   Labs:  Recent Labs    11/28/20 2149 11/28/20 2204 11/29/20 0103 12/01/20 0759 12/01/20 1753 12/02/20 0514  NA 134*   < > 131* 135  --  136  K 4.1   < > 4.1 3.7  --  3.4*  CL 103   < > 103 102  --  101  CO2 23  --  20* 25  --  28  GLUCOSE 212*   < > 197* 213* 320* 167*  BUN 24*   < > 21 18  --  23  CREATININE 1.05   < > 0.90 0.75  --  0.56*  CALCIUM 8.4*  --  8.2* 7.9*  --  8.0*  GFRNONAA >60  --  >60 >60  --  >  60  PROT 7.2  --  7.1 6.3*  --   --   ALBUMIN 2.5*  --  2.5* 2.2*  --   --   AST 27  --  24 24  --   --   ALT 27  --  25 26  --   --   ALKPHOS 89  --  84 71  --   --   BILITOT 1.8*  --  1.8* 0.7  --   --    < > = values in this interval not displayed.    Studies:  CT Angio Chest PE W and/or Wo Contrast  Result Date: 11/28/2020 CLINICAL DATA:  Dyspnea EXAM: CT ANGIOGRAPHY CHEST WITH CONTRAST TECHNIQUE: Multidetector CT imaging of the chest was performed using the standard protocol during bolus administration of intravenous contrast. Multiplanar CT image reconstructions and MIPs were obtained to evaluate the vascular anatomy. CONTRAST:  140mL OMNIPAQUE IOHEXOL 350 MG/ML SOLN  COMPARISON:  09/26/2020 FINDINGS: Cardiovascular: There is adequate opacification of the pulmonary arterial tree. There is no intraluminal filling defect identified to suggest acute pulmonary embolism. The central pulmonary arteries are of normal caliber. Mild atherosclerotic calcification within the thoracic aorta. The thoracic aorta is dilated, measuring 4.2 x 3.9 cm in its ascending segment (coronal # 55/sagittal # 85) and 3.8 x 3.4 cm just beyond the takeoff of the left subclavian vein within the aortic arch (axial # 44/sagittal # 96). At the level of the diaphragmatic hiatus, the descending thoracic aorta measures 3.2 cm. Extensive multi-vessel coronary artery calcification. Mild global cardiomegaly with particular enlargement of the a right ventricle. No pericardial effusion. Mediastinum/Nodes: There has developed extensive bilateral hilar and mediastinal adenopathy, possibly reactive in nature given its relatively rapid development since prior examination. Index right hilar lymph node measures 2.3 cm at axial image # 70 and left hilar lymph node measures 1.5 cm at axial image # 74. The visualized thyroid is unremarkable. The esophagus is unremarkable. Lungs/Pleura: Severe centrilobular and paraseptal emphysema, more severe within the apices bilaterally. Since the prior examination, there has developed extensive bibasilar ground-glass pulmonary infiltrate and pulmonary consolidation, more severe within the right lower lobe. Small right pleural effusion has developed. Together, the findings are suspicious for atypical infection, aspiration, or less commonly, drug reaction. Small probable associated right parapneumonic effusion. No pneumothorax. The central airways are widely patent. Upper Abdomen: No acute abnormality. Musculoskeletal: No acute bone abnormality. No suspicious lytic or blastic bone lesion. Review of the MIP images confirms the above findings. IMPRESSION: No pulmonary embolism. Severe  centrilobular emphysema. Interval development of extensive bibasilar pulmonary infiltrate and consolidation most in keeping with atypical infection in the acute setting with small associated right parapneumonic effusion. Less commonly, this may be seen with aspiration or drug reaction. Extensive mediastinal adenopathy may be reactive in nature given its relatively rapid development. Follow-up imaging in 3 months, once the patient acute issues have resolved, would be helpful in documenting resolution. Extensive multi-vessel coronary artery calcification. Mild cardiomegaly with particular enlargement of the right ventricle. Dilation of the thoracic aorta with maximal dimension of 4.2 cm within the ascending segment. Recommend annual imaging followup by CTA or MRA. This recommendation follows 2010 ACCF/AHA/AATS/ACR/ASA/SCA/SCAI/SIR/STS/SVM Guidelines for the Diagnosis and Management of Patients with Thoracic Aortic Disease. Circulation. 2010; 121: Q761-P509. Aortic aneurysm NOS (ICD10-I71.9) Aortic aneurysm NOS (ICD10-I71.9). Aortic Atherosclerosis (ICD10-I70.0) and Emphysema (ICD10-J43.9). Electronically Signed   By: Fidela Salisbury MD   On: 11/28/2020 23:19   DG Chest Port 1 [address]  Result Date: 11/28/2020 CLINICAL DATA:  Chronic oxygen dependent patient presenting with a 1 week history of progressively worsening shortness of breath and hypoxemia (oxygen saturation in the 70s and 80s). Current history of UIP/pulmonary fibrosis. EXAM: PORTABLE CHEST 1 VIEW COMPARISON:  Chest x-ray 07/15/2020. High-resolution CT chest 09/26/2020. FINDINGS: Cardiac silhouette upper normal in size for AP portable technique. Interstitial opacities throughout both lungs which are not felt to be significantly changed since the high-resolution CT. More confluent opacity peripherally in the mid RIGHT lung was shown on the CT to represent scarring. IMPRESSION: Chronic interstitial lung disease/pulmonary fibrosis. No convincing evidence of  acute cardiopulmonary disease. Electronically Signed   By: Evangeline Dakin M.D.   On: 11/28/2020 21:55   DG Swallowing Func-Speech Pathology  Result Date: 11/30/2020 Objective Swallowing Evaluation: Type of Study: MBS-Modified Barium Swallow Study  Patient Details Name: DARLY FAILS MRN: [id number] Date of Birth: November 15, 1945 Today's Date: 11/30/2020 Time: SLP Start Time (ACUTE ONLY): 1045 -SLP Stop Time (ACUTE ONLY): 1115 SLP Time Calculation (min) (ACUTE ONLY): 30 min Past Medical History: Past Medical History: Diagnosis Date . Adenomatous colon polyp  . Cataract  . Diabetes mellitus  . Emphysema  . Emphysema of lung ([place])  . Erectile dysfunction  . Hyperlipidemia  . Hypertension  . Peripheral vascular disease ([place])  . Pneumonia  . Pulmonary nodule  Past Surgical History: Past Surgical History: Procedure Laterality Date . ABDOMINAL AORTIC ANEURYSM REPAIR  2004 . COLONOSCOPY  multiple since 2004 HPI: PAYMON ROSENSTEEL is a 75 y.o. 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 CT Chest shows "Severe centrilobular and paraseptal emphysema, more  severe within the apices bilaterally. Since the prior examination,  there has developed extensive bibasilar ground-glass pulmonary  infiltrate and pulmonary consolidation, more severe within the right  lower lobe. Small right pleural effusion has developed. 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 On 06-03-20 Dr. Nicolette Bang performed rt parotidectomy with facial nerve dissection which ultimately req'd  sacrifice of upper division of rt facial nerve. Rt selective neck dissection also that date.  Pt had three sessions of OP SLP therapy due to facial nerve paresis. Per ENT "He also recieved postoperative radiation therapy but did not f/u with SLP therapy after that.  Since that time he is always had some hearing problems and wears hearing aids but his hearing in his right ear has been worse.  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  No data recorded Assessment / Plan / Recommendation CHL IP CLINICAL IMPRESSIONS 11/30/2020 Clinical Impression Pt demonstrates a mild to moderate dysphagia with trace to mild sensed aspiration events that now pt reports are new over the past few days.  Oral deficits include anterior loss during chin tuck, base of tongue weakness, likely on the right leading to mild premature spillage during bolus formation with trace penetration events before the swallow and one instance of significant aspiration with hard cough. Pt also has decreased laryngeal closure during the swallow though epiglottic  deflection and laryngal elevation with glottic clousre are quite complete and sustained in many attempts. Suspect decreased seal of arytenoid tissue leading to penetration during the swallow and eventual trace aspiration as glottic closure is released. Pt typically senses this with a throat clear or cough, though not all aspirate is cleared. Nectar thick liquids and puree are tolerated without aspiration and a cue for an effortful swallow eliminates mild vallecular residue that was otherwise present. Generally, this dysphagia is mild and possibly temporary if pt can improve conditioning and general strength and pulmonary hygiene. Recommend puree (pts baseline as he does not masticate solids since surgery)and nectar thick liquids during acute phase of rehabilitation. Will benefit from RMST, training for preventative cough strategy and  strengthning for increased laryngeal closure as well as preventative oral hygiene measures. SLP Visit Diagnosis Dysphagia, oropharyngeal phase (R13.12) Attention and concentration deficit following -- Frontal lobe and executive function deficit following -- Impact on safety and function Moderate aspiration risk;Risk for inadequate nutrition/hydration   CHL IP TREATMENT RECOMMENDATION 11/30/2020 Treatment Recommendations Therapy as outlined in treatment plan below   Prognosis 11/30/2020 Prognosis for Safe Diet Advancement Good Barriers to Reach Goals -- Barriers/Prognosis Comment -- CHL IP DIET RECOMMENDATION 11/30/2020 SLP Diet Recommendations Dysphagia 1 (Puree) solids;Nectar thick liquid Liquid Administration via Straw Medication Administration Whole meds with liquid Compensations Slow rate;Small sips/bites;Effortful swallow;Clear throat after each swallow Postural Changes --   No flowsheet data found.  CHL IP FOLLOW UP RECOMMENDATIONS 11/30/2020 Follow up Recommendations Inpatient Rehab   CHL IP FREQUENCY AND DURATION 11/30/2020 Speech Therapy Frequency (ACUTE ONLY) min 2x/week Treatment Duration 2 weeks      CHL IP ORAL PHASE 11/30/2020 Oral Phase Impaired Oral - Pudding Teaspoon -- Oral - Pudding Cup -- Oral - Honey Teaspoon -- Oral - Honey Cup -- Oral - Nectar Teaspoon -- Oral - Nectar Cup -- Oral - Nectar Straw Premature spillage Oral - Thin Teaspoon -- Oral - Thin Cup -- Oral - Thin Straw Premature spillage;Right anterior bolus loss Oral - Puree WFL Oral - Mech Soft -- Oral - Regular -- Oral - Multi-Consistency -- Oral - Pill -- Oral Phase - Comment --  CHL IP PHARYNGEAL PHASE 11/30/2020 Pharyngeal Phase Impaired Pharyngeal- Pudding Teaspoon -- Pharyngeal -- Pharyngeal- Pudding Cup -- Pharyngeal -- Pharyngeal- Honey Teaspoon -- Pharyngeal -- Pharyngeal- Honey Cup -- Pharyngeal -- Pharyngeal- Nectar Teaspoon -- Pharyngeal -- Pharyngeal- Nectar Cup -- Pharyngeal -- Pharyngeal- Nectar Straw Reduced epiglottic  inversion;Reduced tongue base retraction;Pharyngeal residue - valleculae Pharyngeal -- Pharyngeal- Thin Teaspoon -- Pharyngeal -- Pharyngeal- Thin Cup -- Pharyngeal -- Pharyngeal- Thin Straw Trace aspiration;Penetration/Aspiration before swallow;Penetration/Aspiration during swallow;Reduced airway/laryngeal closure;Reduced epiglottic inversion Pharyngeal -- Pharyngeal- Puree Pharyngeal residue - valleculae Pharyngeal -- Pharyngeal- Mechanical Soft -- Pharyngeal -- Pharyngeal- Regular -- Pharyngeal -- Pharyngeal- Multi-consistency -- Pharyngeal -- Pharyngeal- Pill -- Pharyngeal -- Pharyngeal Comment --  No flowsheet data found. DeBlois, Katherene Ponto 11/30/2020, 11:55 AM              ECHOCARDIOGRAM COMPLETE  Result Date: 11/29/2020    ECHOCARDIOGRAM REPORT   Patient Name:   CAMARI WISHAM Date of Exam: 11/29/2020 Medical Rec #:  [id number]       Height:       72.0 in Accession #:    [id number]      Weight:       143.8 lb Date of Birth:  04-29-1946        BSA:  1.852 m Patient Age:    25 years        BP:           94/54 mmHg Patient Gender: M               HR:           65 bpm. Exam Location:  Inpatient Procedure: 2D Echo, Cardiac Doppler and Color Doppler Indications:    COPD  History:        Patient has prior history of Echocardiogram examinations, most                 recent 08/26/2017. Risk Factors:Dyslipidemia, Hypertension and                 Diabetes.  Sonographer:    Luisa Hart RDCS Referring Phys: [place]  1. Left ventricular ejection fraction, by estimation, is 65 to 70%. The left ventricle has normal function. The left ventricle has no regional wall motion abnormalities. Left ventricular diastolic parameters were normal.  2. Right ventricular systolic function is low normal. The right ventricular size is moderately enlarged. There is mildly elevated pulmonary artery systolic pressure.  3. Left atrial size was mild to moderately dilated.  4. Right atrial size was mild  to moderately dilated.  5. The mitral valve is grossly normal. Trivial mitral valve regurgitation.  6. The aortic valve is tricuspid. There is mild calcification of the aortic valve. There is mild thickening of the aortic valve. Aortic valve regurgitation is not visualized. Mild aortic valve sclerosis is present, with no evidence of aortic valve stenosis.  7. The inferior vena cava is dilated in size with <50% respiratory variability, suggesting right atrial pressure of 15 mmHg. Comparison(s): Prior images unable to be directly viewed, comparison made by report only. Conclusion(s)/Recommendation(s): RV moderately enlarged, with mildly elevated RVSP. Normal LVEF. FINDINGS  Left Ventricle: Left ventricular ejection fraction, by estimation, is 65 to 70%. The left ventricle has normal function. The left ventricle has no regional wall motion abnormalities. The left ventricular internal cavity size was normal in size. There is  borderline left ventricular hypertrophy. Left ventricular diastolic parameters were normal. Right Ventricle: The right ventricular size is moderately enlarged. Right vetricular wall thickness was not well visualized. Right ventricular systolic function is low normal. There is mildly elevated pulmonary artery systolic pressure. The tricuspid regurgitant velocity is 2.57 m/s, and with an assumed right atrial pressure of 15 mmHg, the estimated right ventricular systolic pressure is 27.2 mmHg. Left Atrium: Left atrial size was mild to moderately dilated. Right Atrium: Right atrial size was mild to moderately dilated. Pericardium: There is no evidence of pericardial effusion. Mitral Valve: The mitral valve is grossly normal. Trivial mitral valve regurgitation. Tricuspid Valve: The tricuspid valve is normal in structure. Tricuspid valve regurgitation is mild. Aortic Valve: The aortic valve is tricuspid. There is mild calcification of the aortic valve. There is mild thickening of the aortic valve. Aortic  valve regurgitation is not visualized. Mild aortic valve sclerosis is present, with no evidence of aortic valve stenosis. Aortic valve mean gradient measures 6.0 mmHg. Aortic valve peak gradient measures 11.8 mmHg. Aortic valve area, by VTI measures 2.51 cm. Pulmonic Valve: The pulmonic valve was grossly normal. Pulmonic valve regurgitation is not visualized. No evidence of pulmonic stenosis. Aorta: The aortic root and ascending aorta are structurally normal, with no evidence of dilitation. Venous: The inferior vena cava is dilated in size with less than 50% respiratory variability,  suggesting right atrial pressure of 15 mmHg. IAS/Shunts: The atrial septum is grossly normal.  LEFT VENTRICLE PLAX 2D LVIDd:         4.66 cm     Diastology LVIDs:         2.88 cm     LV e' medial:    7.72 cm/s LV PW:         1.20 cm     LV E/e' medial:  6.0 LV IVS:        0.70 cm     LV e' lateral:   10.40 cm/s LVOT diam:     2.60 cm     LV E/e' lateral: 4.5 LV SV:         100 LV SV Index:   54 LVOT Area:     5.31 cm  LV Volumes (MOD) LV vol d, MOD A2C: 85.5 ml LV vol s, MOD A2C: 28.9 ml LV SV MOD A2C:     56.6 ml RIGHT VENTRICLE RV S prime:     10.10 cm/s  PULMONARY VEINS TAPSE (M-mode): 1.6 cm      A Reversal Duration: 77.00 msec                             A Reversal Velocity: 16.80 cm/s                             Diastolic Velocity:  26.37 cm/s                             S/D Velocity:        1.40                             Systolic Velocity:   85.88 cm/s AORTIC VALVE                    PULMONIC VALVE AV Area (Vmax):    2.58 cm     PV Vmax:       0.94 m/s AV Area (Vmean):   2.81 cm     PV Vmean:      79.900 cm/s AV Area (VTI):     2.51 cm     PV VTI:        0.263 m AV Vmax:           172.00 cm/s  PV Peak grad:  3.5 mmHg AV Vmean:          113.000 cm/s PV Mean grad:  3.0 mmHg AV VTI:            0.400 m AV Peak Grad:      11.8 mmHg AV Mean Grad:      6.0 mmHg LVOT Vmax:         83.50 cm/s LVOT Vmean:        59.900 cm/s LVOT VTI:           0.189 m LVOT/AV VTI ratio: 0.47  AORTA Ao Root diam: 4.00 cm Ao Asc diam:  3.70 cm MITRAL VALVE               TRICUSPID VALVE MV Area (PHT): 2.50 cm    TR Peak grad:   26.4 mmHg MV Decel Time: 303 msec    TR Vmax:  257.00 cm/s MV E velocity: 46.70 cm/s MV A velocity: 83.90 cm/s  SHUNTS MV E/A ratio:  0.56        Systemic VTI:  0.19 m                            Systemic Diam: 2.60 cm Buford Dresser MD Electronically signed by Buford Dresser MD Signature Date/Time: 11/29/2020/6:11:02 PM    Final

## 2020-12-02 NOTE — Progress Notes (Signed)
  Speech Language Pathology Treatment: Dysphagia  Patient Details Name: Geoffrey Wood MRN: [id number] DOB: 08/23/1945 Today's Date: 12/02/2020 Time: 1410-1430 SLP Time Calculation (min) (ACUTE ONLY): 20 min  Assessment / Plan / Recommendation Clinical Impression  Patient seen to address dysphagia goals. Upon SLP arrival, patient had recently finished walking with PT and during that time his oxygen saturations did drop to 83%. He is currently using an oxygen mask which he keeps in place unless he is eating/drinking. Patient aware of reasoning for thickened liquids but he verbalized dislike. He requested ginger ale and SLP thickened to nectar thick consistency. When drinking, patient stated that "its not as thick as the other stuff" and that he apparently had some thickened liquid that "you couldn't put a knife through". SLP observed patient with straw sips of nectar thick liquids and bites of puree solids. He did not exhibit any overt s/s of aspiration or penetration however throughout session he appeared SOB. SLP to continue to follow patient for ability to advance liquids and for swallow tx, potentially including RMST if MD approves.    HPI HPI: Geoffrey Wood is a 75 y.o. 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 CT Chest shows "Severe centrilobular and paraseptal emphysema, more  severe within the apices bilaterally. Since the prior examination,  there has developed extensive bibasilar ground-glass pulmonary  infiltrate and pulmonary consolidation, more severe within the right  lower lobe. Small right pleural effusion has developed. Together,  the findings are suspicious for atypical infection, aspiration, or  less commonly, drug reaction." Pt has a history of right  parotid cancer s/p excision with flap closure with facial nerve sacrifice and radiation for positive margins with +LNs: Also history of lingual cancer. On 06-03-20 Dr. Nicolette Wood performed rt parotidectomy with facial nerve dissection which ultimately req'd sacrifice of upper division of rt facial nerve. Rt selective neck dissection also that date.  Pt had three sessions of OP SLP therapy due to facial nerve paresis. Per ENT "He also recieved postoperative radiation therapy but did not f/u with SLP therapy after that.  Since that time he is always had some hearing problems and wears hearing aids but his hearing in his right ear has been worse.  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      SLP Plan  Continue with current plan of care       Recommendations  Diet recommendations: Dysphagia 1 (puree);Nectar-thick liquid Liquids provided via: Straw Medication Administration: Whole meds with liquid Supervision: Patient able to self feed Compensations: Slow rate;Small sips/bites;Effortful swallow;Clear throat after each swallow Postural Changes and/or Swallow Maneuvers: Seated upright 90 degrees                Oral Care Recommendations: Oral care BID;Patient independent with oral care Follow up Recommendations: Inpatient Rehab SLP Visit Diagnosis: Dysphagia, oropharyngeal phase (R13.12) Plan: Continue with current plan of care       [place], MA, CCC-SLP Speech Therapy

## 2020-12-02 NOTE — Progress Notes (Signed)
   NAME:  Geoffrey Wood, MRN:  [id number], DOB:  1945/08/30, LOS: 3 ADMISSION DATE:  11/28/2020, CONSULTATION DATE:  11/28/2020 REFERRING MD:  Dr Bonner Puna, CHIEF COMPLAINT:  New oxygen requirement   History of Present Illness:  Geoffrey Wood is a 75 year old male with below medical history who was admitted for acute hypoxemic respiratory failure secondary to presumed aspiration pneumonia.  He is seen by Dr. 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 Not a candidate for antifibrotics at that time due to ongoing weight loss and co-morbidities. Since then he has had worsening shortness of breath and on day of admission he called his primary care office. He was initiated on home oxygen 4L via [place] however he became hypoxemic to 70-80% and presented to the ED on the same day the oxygen was delivered. He denies any preceding fevers, chills, cough, choking, difficulty swallowing prior to this. He was admitted to [facility] and started on antibiotics, nebulizers and oxygen. Swallow/modified barium evaluation demonstrated moderate dysphagia and patient was started on dysphagia diet.  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  Acute on chronic hypoxemic respiratory failure .  4/16 PCCM consulted for recommendations Interim History / Subjective:  He states he feels a lot better today Less short of breath  Objective   Blood pressure (!) 100/47, pulse 64, temperature 97.8 F (36.6 C), resp. rate 20, SpO2 90 %.        Intake/Output Summary (Last 24 hours) at 12/02/2020 1021 Last data filed at 12/02/2020 0500 Gross per 24 hour  Intake  592.34 ml  Output 1050 ml  Net -457.66 ml   There were no vitals filed for this visit.  Examination: General: Elderly, chronically ill-appearing HENT: Moist oral mucosa Lungs: Bibasal rales Cardiovascular: S1-S2 appreciated Abdomen: Soft, bowel sounds appreciated Extremities: No clubbing, no edema Neuro: Alert and oriented x3 GU:   Labs/imaging that I havepersonally reviewed  (right click and "Reselect all SmartList Selections" daily)  CTA reviewed showing no PE, reactive mediastinal adenopathy, severe emphysema,  Recent echocardiogram from 11/29/2020 shows ejection fraction of 65 to 70% with moderately enlarged right ventricle  BNP of 913  Severely reduced diffusing capacity on recent PFT Resolved Hospital Problem list     Assessment & Plan:  Marland Kitchen  Acute hypoxemic respiratory failure .  Severe emphysema with concomitant IPF .  Right pleural effusion  Oxygen requirement likely related to acute exacerbation of COPD/ILD flare -This is slightly improved and oxygen has been reduced  Continue and complete course of antibiotics Cautious diuresis as tolerated  Continue steroids until seen as outpatient  May wean off oxygen if able to tolerate room air    Will follow as needed  Signature

## 2020-12-02 NOTE — Care Management Important Message (Signed)
Important Message  Patient Details IM Letter given to the Patient. Name: Geoffrey Wood MRN: [id number] Date of Birth: 14-Apr-1946   Medicare Important Message Given:  Yes     Kerin Salen 12/02/2020, 1:12 PM

## 2020-12-03 DIAGNOSIS — I2510 Atherosclerotic heart disease of native coronary artery without angina pectoris: Secondary | ICD-10-CM | POA: Diagnosis not present

## 2020-12-03 DIAGNOSIS — J441 Chronic obstructive pulmonary disease with (acute) exacerbation: Secondary | ICD-10-CM | POA: Diagnosis not present

## 2020-12-03 DIAGNOSIS — J189 Pneumonia, unspecified organism: Secondary | ICD-10-CM | POA: Diagnosis not present

## 2020-12-03 DIAGNOSIS — J84112 Idiopathic pulmonary fibrosis: Secondary | ICD-10-CM | POA: Diagnosis not present

## 2020-12-03 DIAGNOSIS — E43 Unspecified severe protein-calorie malnutrition: Secondary | ICD-10-CM | POA: Diagnosis not present

## 2020-12-03 DIAGNOSIS — I10 Essential (primary) hypertension: Secondary | ICD-10-CM | POA: Diagnosis not present

## 2020-12-03 LAB — BASIC METABOLIC PANEL
Anion gap: 5 (ref 5–15)
BUN: 23 mg/dL (ref 8–23)
CO2: 28 mmol/L (ref 22–32)
Calcium: 7.8 mg/dL — ABNORMAL LOW (ref 8.9–10.3)
Chloride: 102 mmol/L (ref 98–111)
Creatinine, Ser: 0.5 mg/dL — ABNORMAL LOW (ref 0.61–1.24)
GFR, Estimated: >60 mL/min (ref >60)
Glucose, Bld: 143 mg/dL — ABNORMAL HIGH (ref 70–99)
Potassium: 4 mmol/L (ref 3.5–5.1)
Sodium: 135 mmol/L (ref 135–145)

## 2020-12-03 LAB — CBC
HCT: 29.8 % — ABNORMAL LOW (ref 39.0–52.0)
Hemoglobin: 9.1 g/dL — ABNORMAL LOW (ref 13.0–17.0)
MCH: 25.5 pg — ABNORMAL LOW (ref 26.0–34.0)
MCHC: 30.5 g/dL (ref 30.0–36.0)
MCV: 83.5 fL (ref 80.0–100.0)
Platelets: 63 10*3/uL — ABNORMAL LOW (ref 150–400)
RBC: 3.57 MIL/uL — ABNORMAL LOW (ref 4.22–5.81)
RDW: 27.6 % — ABNORMAL HIGH (ref 11.5–15.5)
WBC: 3.7 10*3/uL — ABNORMAL LOW (ref 4.0–10.5)
nRBC: 19 % — ABNORMAL HIGH (ref 0.0–0.2)

## 2020-12-03 LAB — GLUCOSE, CAPILLARY
Glucose-Capillary: 98 mg/dL (ref 70–99)
Glucose-Capillary: 248 mg/dL — ABNORMAL HIGH (ref 70–99)
Glucose-Capillary: 426 mg/dL — ABNORMAL HIGH (ref 70–99)
Glucose-Capillary: 315 mg/dL — ABNORMAL HIGH (ref 70–99)
Glucose-Capillary: 233 mg/dL — ABNORMAL HIGH (ref 70–99)

## 2020-12-03 LAB — LEGIONELLA PNEUMOPHILA SEROGP 1 UR AG: L. pneumophila Serogp 1 Ur Ag: NEGATIVE

## 2020-12-03 LAB — MAGNESIUM: Magnesium: 1.9 mg/dL (ref 1.7–2.4)

## 2020-12-03 MED ORDER — INSULIN ASPART 100 UNIT/ML ~~LOC~~ SOLN
7.0000 [IU] | Freq: Three times a day (TID) | SUBCUTANEOUS | Status: DC
  Administered 2020-12-03 – 2020-12-04 (×3): 7 [IU] via SUBCUTANEOUS

## 2020-12-03 MED ORDER — PREDNISONE 20 MG PO TABS
40.0000 mg | ORAL_TABLET | Freq: Every day | ORAL | Status: DC
  Administered 2020-12-04: 40 mg via ORAL
  Filled 2020-12-03: qty 2

## 2020-12-03 MED ORDER — INSULIN GLARGINE 100 UNIT/ML ~~LOC~~ SOLN
5.0000 [IU] | Freq: Every day | SUBCUTANEOUS | Status: DC
  Administered 2020-12-03: 5 [IU] via SUBCUTANEOUS
  Filled 2020-12-03 (×2): qty 0.05

## 2020-12-03 NOTE — TOC Initial Note (Signed)
Transition of Care [facility]) - Initial/Assessment Note    Patient Details  Name: Geoffrey Wood MRN: [id number] Date of Birth: 01/21/1946  Transition of Care [facility]) CM/SW Contact:    Trish Mage, LCSW Phone Number: 12/03/2020, 2:34 PM  Clinical Narrative:  Met with patient in follow up to PT recommendation of [facility].  Very HOH, Mr Mamula eventually deferred to his wife as he was having difficulty hearing what I was saying.  Spoke with Ms Umbach by phone, and she wholeheartedly embraced a referral to [facility], stating she had been afraid that she was going to have to set that up herself through the PCP, and she wants him to get stronger so that he can back on the golf course.  No preference of agencies expressed.  Cindie with [facility] agrees to provide [facility] PT services.  No further needs identified. TOC will continue to follow during the course of hospitalization.                  Expected Discharge Plan: [place] Barriers to Discharge: No Barriers Identified   Patient Goals and CMS Choice Patient states their goals for this hospitalization and ongoing recovery are:: "I hope to go home tomorrow."   Choice offered to / list presented to : Spouse  Expected Discharge Plan and Services Expected Discharge Plan: [place] In-house Referral: Clinical Social Work   Post Acute Care Choice: [place] arrangements for the past 2 months: Apartment                                      Prior Living Arrangements/Services Living arrangements for the past 2 months: Apartment Lives with:: Spouse Patient language and need for interpreter reviewed:: Yes        Need for Family Participation in Patient Care: Yes (Comment) Care giver support system in place?: Yes (comment)   Criminal Activity/Legal Involvement Pertinent to Current Situation/Hospitalization: No - Comment as needed  Activities of Daily Living Home Assistive Devices/Equipment: None ADL  Screening (condition at time of admission) Patient's cognitive ability adequate to safely complete daily activities?: Yes Is the patient deaf or have difficulty hearing?: Yes Does the patient have difficulty seeing, even when wearing glasses/contacts?: Yes Does the patient have difficulty concentrating, remembering, or making decisions?: No Patient able to express need for assistance with ADLs?: Yes Does the patient have difficulty dressing or bathing?: No Independently performs ADLs?: Yes (appropriate for developmental age) Does the patient have difficulty walking or climbing stairs?: No Weakness of Legs: Both Weakness of Arms/Hands: None  Permission Sought/Granted Permission sought to share information with : Family Supports Permission granted to share information with : Yes, Verbal Permission Granted  Share Information with NAME: Duggan,Patricia (Spouse)   [phone]           Emotional Assessment Appearance:: Appears stated age Attitude/Demeanor/Rapport: Engaged Affect (typically observed): Appropriate Orientation: : Oriented to Self,Oriented to Place,Oriented to  Time,Oriented to Situation Alcohol / Substance Use: Not Applicable Psych Involvement: No (comment)  Admission diagnosis:  COPD exacerbation ([place]) [J44.1] Community acquired pneumonia, unspecified laterality [J18.9] Patient Active Problem List   Diagnosis Date Noted  . Protein-calorie malnutrition, severe 11/30/2020  . COPD exacerbation ([place]) 11/29/2020  . CAP (community acquired pneumonia) 11/29/2020  . Cellulitis 11/29/2020  . Anemia 11/12/2020  . Iron deficiency anemia 11/12/2020  . IPF (idiopathic pulmonary fibrosis) (  [place]) 09/30/2020  . 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 Malignant neoplasm of parotid gland ([place]) 07/05/2020  . Coronary artery calcification 11/16/2018  . Pulmonary nodules 01/22/2014  . Personal history of adenomatous colonic polyps 12/24/2011  . HTN (hypertension) 04/21/2011  .  Hyperlipidemia 04/21/2011   PCP:  Crist Infante, MD Pharmacy:   [id number] [id number]: [phone]: [phone]    Social Determinants of Health (SDOH) Interventions    Readmission Risk Interventions No flowsheet data found.

## 2020-12-03 NOTE — Progress Notes (Signed)
   NAME:  Geoffrey Wood, MRN:  [id number], DOB:  03/05/1946, LOS: 4 ADMISSION DATE:  11/28/2020, CONSULTATION DATE:  11/28/2020 REFERRING MD:  Dr Bonner Puna, CHIEF COMPLAINT:  New oxygen requirement   History of Present Illness:  [name] is a 75 year old male with below medical history who was admitted for acute hypoxemic respiratory failure secondary to presumed aspiration pneumonia.  He is seen by Dr. 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 Not a candidate for antifibrotics at that time due to ongoing weight loss and co-morbidities. Since then he has had worsening shortness of breath and on day of admission he called his primary care office. He was initiated on home oxygen 4L via [place] however he became hypoxemic to 70-80% and presented to the ED on the same day the oxygen was delivered. He denies any preceding fevers, chills, cough, choking, difficulty swallowing prior to this. He was admitted to [facility] and started on antibiotics, nebulizers and oxygen. Swallow/modified barium evaluation demonstrated moderate dysphagia and patient was started on dysphagia diet.  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  Acute on chronic hypoxemic respiratory failure .  4/16 PCCM consulted for recommendations  Interim History / Subjective:  Less short of breath Denies pain or discomfort  Objective   Blood pressure (!) 92/52, pulse 71, temperature (!) 97.5 F (36.4 C), resp. rate 20, SpO2 97 %.        Intake/Output Summary (Last 24 hours) at 12/03/2020 0916 Last data filed at 12/03/2020 0745 Gross per 24 hour  Intake 3 ml   Output 2175 ml  Net -2172 ml   There were no vitals filed for this visit.  Examination: General: Chronically ill-appearing, elderly HENT: Moist oral mucosa Lungs: Rales bibasilarly Cardiovascular: S1-S2 appreciated Abdomen: Soft, bowel sounds appreciated Extremities: Finger clubbing bilaterally, no peripheral edema Neuro: Alert and oriented x3, very hard of hearing GU:   Labs/imaging that I havepersonally reviewed  (right click and "Reselect all SmartList Selections" daily)  CTA reviewed showing no PE, reactive mediastinal adenopathy, severe emphysema,  Recent echocardiogram from 11/29/2020 shows ejection fraction of 65 to 70% with moderately enlarged right ventricle  BNP of 913  Severely reduced diffusing capacity on recent PFT  Resolved Hospital Problem list     Assessment & Plan:  Marland Kitchen  Acute hypoxemic respiratory failure .  Severe emphysema with concomitant pulmonary fibrosis .  Pleural effusion  .  Oxygen requirement appears to be improving -Goal is to continue to wean as tolerated -Reason for oxygen requirement is an exacerbation of underlying lung disease  .  Encouraged to complete course of antibiotics .  Decrease prednisone dose to 40mg   .  Continue to wean oxygen on if it can be stopped it should be as long as saturations above 89%  .  To be followed by Dr. Vaughan Browner in the office  Sherrilyn Rist, MD [place] PCCM Pager: [phone]

## 2020-12-03 NOTE — Progress Notes (Signed)
PROGRESS NOTE  Geoffrey Wood  KVQ:[id number] DOB: 01-08-1946 DOA: 11/28/2020 PCP: Crist Infante, MD  Outpatient Specialists: Oncology, Dr. Irene Limbo; ENT, Dr. Lucia Gaskins; Pulmonary, Dr. Vaughan Browner Brief Narrative: Geoffrey Wood is a 75 y.o. male with a history of severe emphysema, IPF recently started on 4L O2 at home, [facility] of right parotid s/p excision and radiation with chronic wound, ITP, HTN, and hard of hearing who presented to the ED late 4/14 for progressive shortness of breath and hypoxia to 70-80%'s despite starting 4L O2 at home. He was confirmed to be hypoxic, due to mouth breathing, placed on simple mask with improved oxygenation. CXR demonstrated chronic interstial lung disease/fibrosis which appeared stable from prior HRCT. Subsequent CTA chest revealed extensive bibasilar infiltrate/consolidations with small right parapneumonic effusion with extensive mediastinal adenopathy. No PE, severe emphysema. Antibiotics were given and patient admitted. Pulmonary was consulted, started patient on prednisone 1mg /kg 4/17. CBC reveals pancytopenia for which hematology input was requested, suspecting bone marrow abnormality, e.g. MDS. No transfusions have been required thus far. Shortness of breath has improved though severe hypoxemia remains.   Assessment & Plan: Active Problems:   HTN (hypertension)   Hyperlipidemia   Coronary artery calcification   Immune thrombocytopenia (HCC)   IPF (idiopathic pulmonary fibrosis) (HCC)   COPD exacerbation (HCC)   CAP (community acquired pneumonia)   Cellulitis   Protein-calorie malnutrition, severe  Acute on chronic hypoxic respiratory failure: Due to bibasilar pneumonia, suspected ILD flare, COPD exacerbation, pleural effusion on baseline severe emphysema, IPF. No PE on CTA. Covid and flu negative.  - Started prednisone > taper to 40mg  per PCCM today. Plan to continue steroids until outpatient pulmonary follow up.  - Completed CTX, azithromycin x5 days. PCT  reassuring, WBC normal.  - Dysphagia 1 diet per SLP. - Monitor blood cultures (NG4D) - Following sputum cultures, broader viral respiratory panel (some preceding URI symptoms) both still pending/sent. - Continue bronchodilators scheduled duoneb and prn albuterol - BNP grossly elevated with cardiomegaly, particularly RV enlargement. Echo shows RV enlargement and elevated right-sided pressures consistent with developing cor pulmonale. normal LV. Diuresing well with intermittent lasix. Remains peripherally overloaded though BP limits diuresis, will hold today.  - Repeat CT recommended in 3 months to confirm resolution of infiltrates, effusion, and adenopathy.   IPF, COPD: Followed by pulmonary, Dr. Vaughan Browner. Held off initiation of treatment at Feb visit with plans to follow up in 6 months. - Plan per pulm as above.  - Palliative care also following patient.  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 Irene Limbo, treated with IVIG in the past.  - Hematology, Dr. Alvy Bimler consulted. 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  - Transfusion threshold for hgb 8g/dl or platelet <50k. Can consider BM aspirate after recovery from this illness.  - VTE ppx has been held since admission.   - Continue monitoring CBC, continue po iron.  Hypokalemia: Resolved with supplementation.   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  - Follow up with ENT, this was scheduled 4/20. - Has CT soft tissue head/neck scheduled 4/21 with follow up with [name] 4/22.  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 This has not changed and he's told it's unlikely to ever close up. Has continued wound  care by his wife diligently.  -  Has follow up with Dr. Lucia Gaskins. Will defer further management to him. Do not feel this requires targeted treatment for cellulitis at this time.  Severe protein-calorie malnutrition:  - Supplement protein as much as possible. Medical record indicates a feeding tube is being considered, though he's tolerating po.   Hypotension: This appears to be a nonacute issue. No evidence of sepsis or brisk large vessel bleeding.  - Caution with diuresis.   Hyperbilirubinemia: Mild, has history of same. Hgb is stable.   Extensive multivessel CAD, PVD, HLD: Noted on CTA. TWI, ST depressions on ECG. - Troponin reassuring x2, no chest pain. LVEF 19-62%, normal diastolic parameters and wall motion. RV enlarged with elevated PASP and RA pressure, moderate biatrial enlargement.   Ascending aortic aneurysm: 4.2cm on CTA. Also s/p AAA repair 2004. - Follow up CTA or MRA recommended annually.  T2DM with steroid-induced hyperglycemia: HbA1c 5.5%  - Added lantus, will decrease to 5u qHS with fasting euglycemia and plan to decrease steroids 4/20. Increase mealtime to 7u TIDWC + resistant SSI, HS correction.   DVT prophylaxis: SCD Code Status: Full Family Communication: None at bedside Disposition Plan:  Status is: Inpatient  Remains inpatient appropriate because:Inpatient level of care appropriate due to severity of illness  Dispo: The patient is from: Home              Anticipated d/c is to: Home              Patient currently is not medically stable to d/c.   Difficult to place patient No  Consultants:   Pulmonary  Heme/onc  Palliative care  Procedures:  Echo 11/29/2020  Antimicrobials:  Vancomycin  4/14  Ceftriaxone, azithromycin 4/14 - 4/18  Subjective: Feeling better, ambulatory with supplemental oxygen. On 5L O2 at rest still. No wheezing or chest pain. Leg swelling is stable.   Objective: Vitals:   12/02/20 1954 12/02/20 2021 12/03/20 0502 12/03/20  0829  BP:  104/61 (!) 92/52   Pulse:  77 71   Resp:  (!) 24 20   Temp:  (!) 97.4 F (36.3 C) (!) 97.5 F (36.4 C)   TempSrc:      SpO2: 100% 97% 100% 97%    Intake/Output Summary (Last 24 hours) at 12/03/2020 0943 Last data filed at 12/03/2020 0745 Gross per 24 hour  Intake 3 ml  Output 2175 ml  Net -2172 ml   Gen: 75 y.o. male in no distress Pulm: Nonlabored tachypnea with 5L O2 by mask at rest, diminished, improved crackles, no wheezes. CV: Regular rate and rhythm. No murmur, rub, or gallop. No JVD, 1+ pitting dependent edema. GI: Abdomen soft, non-tender, non-distended, with normoactive bowel sounds.  Ext: Warm, no deformities Skin: No new rashes, lesions or ulcers on visualized skin. Right facial wound with stable serous drainage, no tenderness or erythema. Neuro: Alert and oriented. Stable HOH. No focal neurological deficits. Psych: Judgement and insight appear fair. Mood euthymic & affect congruent. Behavior is appropriate.    Data Reviewed: I have personally reviewed following labs and imaging studies  CBC: Recent Labs  Lab 11/28/20 2149 11/28/20 2204 11/29/20 0103 12/01/20 0716 12/02/20 0514 12/03/20 0602  WBC 6.1  --  6.3 3.0* 4.3 3.7*  NEUTROABS 3.4  --  3.9  --  2.7  --   HGB 9.6* 9.2* 9.0* 8.4* 8.4* 9.1*  HCT 30.9* 27.0* 29.1* 28.0* 27.8* 29.8*  MCV 81.3  --  81.3 82.4 83.2 83.5  PLT 82*  --  78* 63*  62* 63*   Basic Metabolic Panel: Recent Labs  Lab 11/28/20 2149 11/28/20 2204 11/29/20 0103 12/01/20 0759 12/01/20 1753 12/02/20 0514 12/03/20 0602  NA 134* 136 131* 135  --  136 135  K 4.1 4.2 4.1 3.7  --  3.4* 4.0  CL 103 102 103 102  --  101 102  CO2 23  --  20* 25  --  28 28  GLUCOSE 212* 203* 197* 213* 320* 167* 143*  BUN 24* 23 21 18   --  23 23  CREATININE 1.05 0.90 0.90 0.75  --  0.56* 0.50*  CALCIUM 8.4*  --  8.2* 7.9*  --  8.0* 7.8*  MG 2.1  --   --   --   --   --  1.9   GFR: Estimated Creatinine Clearance: 71.3 mL/min (A) (by C-G  formula based on SCr of 0.5 mg/dL (L)). Liver Function Tests: Recent Labs  Lab 11/28/20 2149 11/29/20 0103 12/01/20 0759  AST 27 24 24   ALT 27 25 26   ALKPHOS 89 84 71  BILITOT 1.8* 1.8* 0.7  PROT 7.2 7.1 6.3*  ALBUMIN 2.5* 2.5* 2.2*   No results for input(s): LIPASE, AMYLASE in the last 168 hours. No results for input(s): AMMONIA in the last 168 hours. Coagulation Profile: Recent Labs  Lab 12/02/20 0514  INR 1.2   Cardiac Enzymes: No results for input(s): CKTOTAL, CKMB, CKMBINDEX, TROPONINI in the last 168 hours. BNP (last 3 results) No results for input(s): PROBNP in the last 8760 hours. HbA1C: No results for input(s): HGBA1C in the last 72 hours. CBG: Recent Labs  Lab 12/02/20 0738 12/02/20 1142 12/02/20 1634 12/02/20 2012 12/03/20 0751  GLUCAP 149* 299* 314* 279* 98   Lipid Profile: No results for input(s): CHOL, HDL, LDLCALC, TRIG, CHOLHDL, LDLDIRECT in the last 72 hours. Thyroid Function Tests: No results for input(s): TSH, T4TOTAL, FREET4, T3FREE, THYROIDAB in the last 72 hours. Anemia Panel: No results for input(s): VITAMINB12, FOLATE, FERRITIN, TIBC, IRON, RETICCTPCT in the last 72 hours. Urine analysis:    Component Value Date/Time   COLORURINE AMBER (A) 11/29/2020 0026   APPEARANCEUR CLEAR 11/29/2020 0026   LABSPEC >1.046 (H) 11/29/2020 0026   PHURINE 6.0 11/29/2020 0026   GLUCOSEU NEGATIVE 11/29/2020 0026   HGBUR NEGATIVE 11/29/2020 0026   BILIRUBINUR NEGATIVE 11/29/2020 0026   KETONESUR NEGATIVE 11/29/2020 0026   PROTEINUR NEGATIVE 11/29/2020 0026   NITRITE NEGATIVE 11/29/2020 0026   LEUKOCYTESUR NEGATIVE 11/29/2020 0026   Recent Results (from the past 240 hour(s))  Resp Panel by RT-PCR (Flu A&B, Covid) Nasopharyngeal Swab     Status: None   Collection Time: 11/28/20 10:03 PM   Specimen: Nasopharyngeal Swab; Nasopharyngeal(NP) swabs in vial transport medium  Result Value Ref Range Status   SARS Coronavirus 2 by RT PCR NEGATIVE NEGATIVE Final     Comment: (NOTE) SARS-CoV-2 target nucleic acids are NOT DETECTED.  The SARS-CoV-2 RNA is generally detectable in upper respiratory specimens during the acute phase of infection. The lowest concentration of SARS-CoV-2 viral copies this assay can detect is 138 copies/mL. A negative result does not preclude SARS-Cov-2 infection and should not be used as the sole basis for treatment or other patient management decisions. A negative result may occur with  improper specimen collection/handling, submission of specimen other than nasopharyngeal swab, presence of viral mutation(s) within the areas targeted by this assay, and inadequate number of viral copies(<138 copies/mL). A negative result must be combined with clinical observations, patient history, and epidemiological  information. The expected result is Negative.  Fact Sheet for Patients:  EntrepreneurPulse.com.au  Fact Sheet for Healthcare Providers:  IncredibleEmployment.be  This test is no t yet approved or cleared by the [place] FDA and  has been authorized for detection and/or diagnosis of SARS-CoV-2 by FDA under an Emergency Use Authorization (EUA). This EUA will remain  in effect (meaning this test can be used) for the duration of the COVID-19 declaration under Section 564(b)(1) of the Act, 21 U.S.C.section 360bbb-3(b)(1), unless the authorization is terminated  or revoked sooner.       Influenza A by PCR NEGATIVE NEGATIVE Final   Influenza B by PCR NEGATIVE NEGATIVE Final    Comment: (NOTE) The Xpert Xpress SARS-CoV-2/FLU/RSV plus assay is intended as an aid in the diagnosis of influenza from Nasopharyngeal swab specimens and should not be used as a sole basis for treatment. Nasal washings and aspirates are unacceptable for Xpert Xpress SARS-CoV-2/FLU/RSV testing.  Fact Sheet for Patients: EntrepreneurPulse.com.au  Fact Sheet for Healthcare  Providers: IncredibleEmployment.be  This test is not yet approved or cleared by the [place] FDA and has been authorized for detection and/or diagnosis of SARS-CoV-2 by FDA under an Emergency Use Authorization (EUA). This EUA will remain in effect (meaning this test can be used) for the duration of the COVID-19 declaration under Section 564(b)(1) of the Act, 21 U.S.C. section 360bbb-3(b)(1), unless the authorization is terminated or revoked.  Performed at [facility], [place] [address] [place], [place] [address]   Culture, blood (routine x 2) Call MD if unable to obtain prior to antibiotics being given     Status: None (Preliminary result)   Collection Time: 11/29/20  1:03 AM   Specimen: BLOOD  Result Value Ref Range Status   Specimen Description   Final    BLOOD RIGHT ANTECUBITAL Performed at [facility] [address] [place], [place] [address]    Special Requests   Final    BOTTLES DRAWN AEROBIC ONLY Blood Culture results may not be optimal due to an excessive volume of blood received in culture bottles Performed at [facility] [address] [place], [place] [address]    Culture   Final    NO GROWTH 4 DAYS Performed at [facility], [place] [address], [place], [place] [address]    Report Status PENDING  Incomplete  Culture, blood (routine x 2) Call MD if unable to obtain prior to antibiotics being given     Status: None (Preliminary result)   Collection Time: 11/29/20  1:03 AM   Specimen: BLOOD RIGHT HAND  Result Value Ref Range Status   Specimen Description   Final    BLOOD RIGHT HAND Performed at [facility] [address] [place], [place] [address]    Special Requests   Final    BOTTLES DRAWN AEROBIC ONLY Blood Culture results may not be optimal due to an excessive volume of blood received in culture bottles Performed at [facility]  [address] [place], [place] [address]    Culture   Final    NO GROWTH 4 DAYS Performed at [facility], [place] [address] [place], [place] [address]    Report Status PENDING  Incomplete      Radiology Studies: No results found.  Scheduled Meds: . feeding supplement (KATE FARMS STANDARD 1.4)  325 mL Oral TID BM  . insulin aspart  0-20 Units Subcutaneous TID WC  .  insulin aspart  0-5 Units Subcutaneous QHS  . insulin aspart  5 Units Subcutaneous TID WC  . insulin glargine  15 Units Subcutaneous QHS  . ipratropium-albuterol  3 mL Nebulization TID  . multivitamin with minerals  1 tablet Oral Daily  . pantoprazole  40 mg Oral Daily  . [START ON 12/04/2020] predniSONE  40 mg Oral Q breakfast  . protein supplement  1 Scoop Oral TID WC  . sodium chloride flush  3 mL Intravenous Q12H   Continuous Infusions:    LOS: 4 days   Time spent: 35 minutes.  Patrecia Pour, MD [facility] [email or web address] 12/03/2020, 9:43 AM

## 2020-12-03 NOTE — Progress Notes (Signed)
SLP Cancellation Note  Patient Details Name: Geoffrey Wood MRN: [id number] DOB: Oct 22, 1945   Cancelled treatment:       Reason Eval/Treat Not Completed: Other (comment) (Earlier pt was working with RN on wound care, will continue efforts.)  Kathleen Lime, MS [place] Office [phone] Pager [phone]   [name] 12/03/2020, 7:32 PM

## 2020-12-03 NOTE — Progress Notes (Signed)
Physical Therapy Treatment Patient Details Name: Geoffrey Wood MRN: [id number] DOB: [birth year] Today's Date: 12/03/2020    History of Present Illness 75 yo [facility]14 with shortness of breath x1 week. CTA negative for PE, positive for infiltrates and consolidation concerning for PNA. Workup also for COPD exacerbation. 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    PT Comments    Pt ambulated in hallway and continues to require 6L O2 mask for ambulation.  Pt on multiple extension lines in room, so directly applied oxygen line to wall piece once back in room.  Pt reports overall feeling better today.   Follow Up Recommendations  Supervision for mobility/OOB;Home health PT     Equipment Recommendations  None recommended by PT    Recommendations for Other Services       Precautions / Restrictions Precautions Precautions: Fall Precaution Comments: monitor sats, HOH    Mobility  Bed Mobility               General bed mobility comments: pt in recliner on arrival    Transfers Overall transfer level: Needs assistance Equipment used: Rolling walker (2 wheeled) Transfers: Sit to/from Stand Sit to Stand: Supervision         General transfer comment: for safety and lines  Ambulation/Gait Ambulation/Gait assistance: Min guard Gait Distance (Feet): 200 Feet Assistive device: Rolling walker (2 wheeled) Gait Pattern/deviations: Step-through pattern;Decreased stride length     General Gait Details: Supervision for safety; cues for deep breathing and taking standing rest breaks Pt was on 4L O2 mask (states mouth breather prefers mask) for ambulation and SPO2 dropped to 86% so increased to 6L and SPO2 90% upon returning to room   Stairs             Wheelchair Mobility    Modified Rankin (Stroke Patients Only)       Balance                                             Cognition Arousal/Alertness: Awake/alert Behavior During Therapy: WFL for tasks assessed/performed Overall Cognitive Status: Within Functional Limits for tasks assessed                                 General Comments: very HOH      Exercises      General Comments        Pertinent Vitals/Pain Pain Assessment: Faces Faces Pain Scale: No hurt Pain Intervention(s): Monitored during session    Home Living                      Prior Function            PT Goals (current goals can now be found in the care plan section) Progress towards PT goals: Progressing toward goals    Frequency    Min 3X/week      PT Plan Current plan remains appropriate    Co-evaluation              AM-PAC PT "6 Clicks" Mobility   Outcome Measure  Help needed turning from your back to your side while in a flat bed without using bedrails?: None Help needed moving from  lying on your back to sitting on the side of a flat bed without using bedrails?: None Help needed moving to and from a bed to a chair (including a wheelchair)?: A Little Help needed standing up from a chair using your arms (e.g., wheelchair or bedside chair)?: A Little Help needed to walk in hospital room?: A Little Help needed climbing 3-5 steps with a railing? : A Little 6 Click Score: 20    End of Session Equipment Utilized During Treatment: Oxygen Activity Tolerance: Patient tolerated treatment well Patient left: in chair;with call bell/phone within reach Nurse Communication: Mobility status PT Visit Diagnosis: Other abnormalities of gait and mobility (R26.89);Difficulty in walking, not elsewhere classified (R26.2)     Time: 0379-5583 PT Time Calculation (min) (ACUTE ONLY): 14 min  Charges:  $Gait Training: 8-22 mins                    Geoffrey Wood PT, DPT Acute Rehabilitation Services Pager: [phone] Office: [phone]  Geoffrey Wood  E 12/03/2020, 4:14 PM

## 2020-12-04 ENCOUNTER — Encounter: Payer: Self-pay | Admitting: General Practice

## 2020-12-04 ENCOUNTER — Ambulatory Visit (INDEPENDENT_AMBULATORY_CARE_PROVIDER_SITE_OTHER): Payer: Medicare Other | Admitting: Otolaryngology

## 2020-12-04 DIAGNOSIS — J189 Pneumonia, unspecified organism: Secondary | ICD-10-CM | POA: Diagnosis not present

## 2020-12-04 DIAGNOSIS — I10 Essential (primary) hypertension: Secondary | ICD-10-CM | POA: Diagnosis not present

## 2020-12-04 DIAGNOSIS — J441 Chronic obstructive pulmonary disease with (acute) exacerbation: Secondary | ICD-10-CM | POA: Diagnosis not present

## 2020-12-04 DIAGNOSIS — I2510 Atherosclerotic heart disease of native coronary artery without angina pectoris: Secondary | ICD-10-CM | POA: Diagnosis not present

## 2020-12-04 LAB — BASIC METABOLIC PANEL
Anion gap: 5 (ref 5–15)
BUN: 22 mg/dL (ref 8–23)
CO2: 29 mmol/L (ref 22–32)
Calcium: 8 mg/dL — ABNORMAL LOW (ref 8.9–10.3)
Chloride: 101 mmol/L (ref 98–111)
Creatinine, Ser: 0.54 mg/dL — ABNORMAL LOW (ref 0.61–1.24)
GFR, Estimated: >60 mL/min (ref >60)
Glucose, Bld: 196 mg/dL — ABNORMAL HIGH (ref 70–99)
Potassium: 4.3 mmol/L (ref 3.5–5.1)
Sodium: 135 mmol/L (ref 135–145)

## 2020-12-04 LAB — CBC
HCT: 30.6 % — ABNORMAL LOW (ref 39.0–52.0)
Hemoglobin: 9.5 g/dL — ABNORMAL LOW (ref 13.0–17.0)
MCH: 25.7 pg — ABNORMAL LOW (ref 26.0–34.0)
MCHC: 31 g/dL (ref 30.0–36.0)
MCV: 82.7 fL (ref 80.0–100.0)
Platelets: 62 10*3/uL — ABNORMAL LOW (ref 150–400)
RBC: 3.7 MIL/uL — ABNORMAL LOW (ref 4.22–5.81)
RDW: 27.8 % — ABNORMAL HIGH (ref 11.5–15.5)
WBC: 3.3 10*3/uL — ABNORMAL LOW (ref 4.0–10.5)
nRBC: 18.4 % — ABNORMAL HIGH (ref 0.0–0.2)

## 2020-12-04 LAB — CULTURE, BLOOD (ROUTINE X 2)
Culture: NO GROWTH
Culture: NO GROWTH

## 2020-12-04 LAB — GLUCOSE, CAPILLARY
Glucose-Capillary: 147 mg/dL — ABNORMAL HIGH (ref 70–99)
Glucose-Capillary: 247 mg/dL — ABNORMAL HIGH (ref 70–99)

## 2020-12-04 MED ORDER — ADULT MULTIVITAMIN W/MINERALS CH: 1.0000 | ORAL_TABLET | Freq: Every day | ORAL | 0 refills | Status: AC

## 2020-12-04 MED ORDER — RESOURCE THICKENUP CLEAR PO POWD: 1.0000 | ORAL | 0 refills | Status: AC | PRN

## 2020-12-04 MED ORDER — KATE FARMS STANDARD 1.4 PO LIQD: 325.0000 mL | Freq: Three times a day (TID) | ORAL | 0 refills | Status: AC

## 2020-12-04 MED ORDER — ALBUTEROL SULFATE (2.5 MG/3ML) 0.083% IN NEBU: 2.5000 mg | INHALATION_SOLUTION | RESPIRATORY_TRACT | 12 refills | Status: AC | PRN

## 2020-12-04 MED ORDER — RESOURCE INSTANT PROTEIN PO PWD PACKET: 1.0000 | Freq: Three times a day (TID) | ORAL | 0 refills | Status: AC

## 2020-12-04 MED ORDER — PREDNISONE 20 MG PO TABS
40.0000 mg | ORAL_TABLET | Freq: Every day | ORAL | 0 refills | Status: DC
Start: 2020-12-05 — End: 2020-12-27

## 2020-12-04 MED ORDER — PANTOPRAZOLE SODIUM 40 MG PO TBEC: 40.0000 mg | DELAYED_RELEASE_TABLET | Freq: Every day | ORAL | 0 refills | Status: AC

## 2020-12-04 NOTE — Progress Notes (Signed)
[facility] CSW Progress Notes  Message from inpatient [place] - wife expressing concerns about transportation.  Called wife - her concern is about how to transport him home from hospital as he is now on oxygen.  She is also concerned about how to use equipment dropped off at the house for oxygen and how to best meet his nutritional needs given challenges of head/neck cancer and diabetic needs.  Conveyed her concerns to [place] and he will give her a call.  Edwyna Shell, LCSW Clinical Social Worker Phone:  [phone]

## 2020-12-04 NOTE — Discharge Summary (Signed)
Physician Discharge Summary  Geoffrey Wood:[id number] DOB: 07/25/1946 DOA: 11/28/2020  PCP: Crist Infante, MD  Admit date: 11/28/2020 Discharge date: 12/04/2020  Admitted From: Home Disposition: Home with Home Health  Recommendations for Outpatient Follow-up:  1. Follow up with PCP in 1-2 weeks 2. Follow up with Pulmonary within 1 week  3. Follow-up with ENT Dr. Lucia Wood within 1 to 2 weeks for wound care  4. Follow-up with medical oncology Dr. Irene Wood 5. Repeat CT chest scan in 3 months 6. Repeat CTA or MRI for a ascending aortic aneurysm 7. Please obtain CMP/CBC, Mag, Phos in one week 8. Please follow up on the following pending results:  Home Health: Yes Equipment/Devices: DME O2, DME Neb    Discharge Condition: Stable CODE STATUS: DO NOT RESUSCITATE Diet recommendation: Dysphagia 3 Diet with Nectar Thick Liquids   Brief/Interim Summary: The patient Geoffrey Wood a75 y.o.malewith a history of severe emphysema, IPF recently started on 4L O2 at home, [facility] of right parotid s/p excision and radiation with chronic wound, ITP, HTN, and hard of hearing who presented to the ED late 4/14 for progressive shortness of breath and hypoxia to 70-80%'s despite starting 4L O2 at home. He was confirmed to be hypoxic, due to mouth breathing, placed on simple mask with improved oxygenation. CXR demonstrated chronic interstial lung disease/fibrosis which appeared stable from prior HRCT. Subsequent CTA chest revealed extensive bibasilar infiltrate/consolidations with small right parapneumonic effusion with extensive mediastinal adenopathy. No PE, severe emphysema. Antibiotics were given and patient admitted. Pulmonary was consulted, started patient on prednisone 1mg /kg 4/17. CBC reveals pancytopenia for which hematology input was requested, suspecting bone marrow abnormality, e.g. MDS. No transfusions have been required thus far. Shortness of breath has improved though severe hypoxemia remained but now  improved.  Patient was weaned to 2 to 4 L and pulmonary signed off the case and recommended prednisone 40 mg daily.  Will be sent home with supplemental oxygen and follow-up with ENT Dr. Lucia Wood as well as pulmonary Dr. Vaughan Wood  Discharge Diagnoses:  Active Problems:   HTN (hypertension)   Hyperlipidemia   Coronary artery calcification   Immune thrombocytopenia (HCC)   IPF (idiopathic pulmonary fibrosis) (HCC)   COPD exacerbation (HCC)   CAP (community acquired pneumonia)   Cellulitis   Protein-calorie malnutrition, severe  Acute on chronic hypoxic respiratory failure: Due to bibasilar pneumonia, suspected ILD flare, COPD exacerbation, pleural effusion on baseline severe emphysema, IPF.  -No PE on CTA. Covid and flu negative.  - Started prednisone > taper to 40mg  per PCCM yesterday . Plan to continue steroids until outpatient pulmonary follow up.  - Completed CTX, azithromycin x5 days. PCT reassuring, WBC normal.  - Dysphagia 1 diet per SLP now changed to dysphagia 3 diet with nectar thick liquids - Monitor blood cultures (NG4D) - Following sputum cultures, broader viral respiratory panel (some preceding URI symptoms) both still pending/sent. - Continue bronchodilators scheduled duoneb and prn albuterol - BNP grossly elevated with cardiomegaly, particularly RV enlargement. Echo shows RV enlargement and elevated right-sided pressures consistent with developing cor pulmonale. normal LV. Diuresing well with intermittent lasix. Remains peripherally overloaded though BP limits diuresis, will hold again today.  - Repeat CT recommended in 3 months to confirm resolution of infiltrates, effusion, and adenopathy.  -Follow-up in outpatient setting  IPF, COPD:  -Followed by pulmonary, Dr. Vaughan Wood.  -Held off initiation of treatment at Feb visit with plans to follow up in 6 months. - Plan per pulm as above.  - Palliative  care also following patient. 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 Irene Wood, treated with IVIG in the past.  - Hematology, Dr. Alvy Bimler consulted. 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  - Transfusion threshold for hgb 8g/dl or platelet <50k. Can consider BM aspirate after recovery from this illness.  -Patient is WBC is now 3.3, hemoglobin/marker is stable at 9.5/30.6 and platelet count of 62 - VTE ppx has been held since admission.   - Continue monitoring CBC, continue po iron. -Follow-up with Dr. Irene Wood in the outpatient setting   Hypokalemia:  -Resolved with supplementation. 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  - Follow up with ENT, this was scheduled 4/20. - Has CT soft tissue head/neck scheduled 4/21 and has a PET CT scan tomorrow and with follow up with Dr. Isidore Wood 4/22.  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 This has not changed and he's told it's unlikely to ever close up. Has continued wound care by his wife diligently.  - Has follow up with Dr. Lucia Wood. Will defer further management to him. Do not feel this requires targeted treatment for cellulitis at this time. -Follow-up with Dr. Lucia Wood in the outpatient setting for further wound management  Severe protein-calorie malnutrition:  - Supplement protein as much as possible. Medical record indicates a feeding tube is being considered, though he's tolerating po.  -Continue dysphagia 3 with nectar thick liquids  Hypotension:  -This appears to be a nonacute issue. No evidence of sepsis or brisk large vessel bleeding.  -  Caution with diuresis.  -Continue monitor blood pressure per protocol -As blood pressure reading was on the softer side of 105/61  Hyperbilirubinemia: -Mild, has history of same. Hgb is stable.  -Initial admission value was 1.8 but then normalized to 0.7 -Repeat CMP in outpatient  Extensive multivessel CAD, PVD, HLD: Noted on CTA. TWI, ST depressions on ECG. - Troponin reassuring x2, no chest pain. LVEF 95-62%, normal diastolic parameters and wall motion. RV enlarged with elevated PASP and RA pressure, moderate biatrial enlargement.  -Follow-up with cardiology in outpatient setting  Ascending aortic aneurysm: 4.2cm on CTA. Also s/p AAA repair 2004. - Follow up CTA or MRA recommended annually.  T2DM with steroid-induced hyperglycemia: HbA1c 5.5%  - Added lantus, will decrease to 5u qHS with fasting euglycemia and plan to decrease steroids 4/20. Increase mealtime to 7u TIDWC + resistant SSI, HS correction.  -Will not discharge on insulin given his normal hemoglobin A1c -If necessary may consider Amaryl 2 mg daily until his steroids have been completed -CBGs ranging from 98-426   Discharge Instructions  Discharge Instructions    Call MD for:  difficulty breathing, headache or visual disturbances   Complete by: As directed    Call MD for:  extreme fatigue   Complete by: As directed    Call MD for:  hives   Complete by: As directed    Call MD for:  persistant dizziness or light-headedness   Complete by: As directed    Call MD for:  persistant nausea and vomiting   Complete by: As directed    Call MD for:  redness, tenderness, or  signs of infection (pain, swelling, redness, odor or green/yellow discharge around incision site)   Complete by: As directed    Call MD for:  severe uncontrolled pain   Complete by: As directed    Call MD for:  temperature >100.4   Complete by: As directed    Diet - low sodium heart healthy   Complete by: As directed    Discharge instructions    Complete by: As directed    You were cared for by a hospitalist during your hospital stay. If you have any questions about your discharge medications or the care you received while you were in the hospital after you are discharged, you can call the unit and ask to speak with the hospitalist on call if the hospitalist that took care of you is not available. Once you are discharged, your primary care physician will handle any further medical issues. Please note that NO REFILLS for any discharge medications will be authorized once you are discharged, as it is imperative that you return to your primary care physician (or establish a relationship with a primary care physician if you do not have one) for your aftercare needs so that they can reassess your need for medications and monitor your lab values.  Follow up with PCP and Pulmonary within 1-2 weeks. Take all medications as prescribed. If symptoms change or worsen please return to the ED for evaluation   Discharge wound care:   Complete by: As directed    Wound care to full thickness wound on right side of head near ear: Cleanse with NS, pat gently dry.  Cover with size-appropriate piece of xeroform gauze, top with dry gauze and secure.  Change twice daily and PRN for dressing dislodgement or drainage strike-through onto exterior of dressing. WILL NEED FOLLOW UP   For home use only DME Nebulizer/meds   Complete by: As directed    Patient needs a nebulizer to treat with the following condition: Pulmonary fibrosis (HCC)   Length of Need: 6 Months   Increase activity slowly   Complete by: As directed      Allergies as of 12/04/2020   No Known Allergies     Medication List    TAKE these medications   acetaminophen 325 MG tablet Commonly known as: TYLENOL Take 325 mg by mouth every 6 (six) hours as needed for mild pain.   albuterol (2.5 MG/3ML) 0.083% nebulizer solution Commonly known as: PROVENTIL Take 3 mLs (2.5 mg total) by nebulization  every 4 (four) hours as needed for shortness of breath.   Anoro Ellipta 62.5-25 MCG/INH Aepb Generic drug: umeclidinium-vilanterol Take 1 puff by mouth daily.   b complex vitamins capsule Take 1 capsule by mouth daily.   feeding supplement (KATE FARMS STANDARD 1.4) Liqd liquid Take 325 mLs by mouth 3 (three) times daily between meals.   mirtazapine 15 MG tablet Commonly known as: REMERON Take 45 mg by mouth at bedtime.   multivitamin with minerals Tabs tablet Take 1 tablet by mouth daily. Start taking on: December 05, 2020   OneTouch Verio test strip Generic drug: glucose blood   pantoprazole 40 MG tablet Commonly known as: PROTONIX Take 1 tablet (40 mg total) by mouth daily. Start taking on: December 05, 2020   predniSONE 20 MG tablet Commonly known as: DELTASONE Take 2 tablets (40 mg total) by mouth daily with breakfast. Start taking on: December 05, 2020   protein supplement 6 g Powd Commonly known as: RESOURCE BENEPROTEIN Take 1 Scoop (  6 g total) by mouth 3 (three) times daily with meals.   Resource ThickenUp Clear Powd Take 1 Container by mouth as needed.   VITAMIN B 12 PO Take 500 mcg by mouth daily.   Vitamin C 500 MG Caps Take 1 capsule by mouth 2 (two) times daily.   VITAMIN D PO Take 800 Units by mouth daily.            Durable Medical Equipment  (From admission, onward)         Start     Ordered   12/04/20 0000  For home use only DME Nebulizer/meds       Question Answer Comment  Patient needs a nebulizer to treat with the following condition Pulmonary fibrosis ([place])   Length of Need 6 Months      12/04/20 1348           Discharge Care Instructions  (From admission, onward)         Start     Ordered   12/04/20 0000  Discharge wound care:       Comments: Wound care to full thickness wound on right side of head near ear: Cleanse with NS, pat gently dry.  Cover with size-appropriate piece of xeroform gauze, top with dry gauze and secure.   Change twice daily and PRN for dressing dislodgement or drainage strike-through onto exterior of dressing. WILL NEED FOLLOW UP   12/04/20 1348          Follow-up Information    [facility], [facility] Follow up.   Specialty: [place] Why: This is the agency that will be providing physical therapy in the home Contact information: [address] [place] [address] [phone]        Crist Infante, MD Follow up.   Specialty: Internal Medicine Why: Follow up within 1 week  Contact information: [place] [address] [phone]        Nahser, Wonda Cheng, MD .   Specialty: Cardiology Contact information: [address] [place] [address] [phone]        Melvenia Needles, NP. Go on 12/11/2020.   Specialty: Pulmonary Disease Contact information: [place] [address] [phone]              No Known Allergies  Consultations:  Pulmonary   Medical oncology  Palliative care medicine  Procedures/Studies: CT Angio Chest PE W and/or Wo Contrast  Result Date: 11/28/2020 CLINICAL DATA:  Dyspnea EXAM: CT ANGIOGRAPHY CHEST WITH CONTRAST TECHNIQUE: Multidetector CT imaging of the chest was performed using the standard protocol during bolus administration of intravenous contrast. Multiplanar CT image reconstructions and MIPs were obtained to evaluate the vascular anatomy. CONTRAST:  184mL OMNIPAQUE IOHEXOL 350 MG/ML SOLN COMPARISON:  09/26/2020 FINDINGS: Cardiovascular: There is adequate opacification of the pulmonary arterial tree. There is no intraluminal filling defect identified to suggest acute pulmonary embolism. The central pulmonary arteries are of normal caliber. Mild atherosclerotic calcification within the thoracic aorta. The thoracic aorta is dilated, measuring 4.2 x 3.9 cm in its ascending segment (coronal # 55/sagittal # 85) and 3.8 x 3.4 cm just beyond the takeoff of  the left subclavian vein within the aortic arch (axial # 44/sagittal # 96). At the level of the diaphragmatic hiatus, the descending thoracic aorta measures 3.2 cm. Extensive multi-vessel coronary artery calcification. Mild global cardiomegaly with particular enlargement of the a right ventricle. No pericardial effusion. Mediastinum/Nodes:  There has developed extensive bilateral hilar and mediastinal adenopathy, possibly reactive in nature given its relatively rapid development since prior examination. Index right hilar lymph node measures 2.3 cm at axial image # 70 and left hilar lymph node measures 1.5 cm at axial image # 74. The visualized thyroid is unremarkable. The esophagus is unremarkable. Lungs/Pleura: Severe centrilobular and paraseptal emphysema, more severe within the apices bilaterally. Since the prior examination, there has developed extensive bibasilar ground-glass pulmonary infiltrate and pulmonary consolidation, more severe within the right lower lobe. Small right pleural effusion has developed. Together, the findings are suspicious for atypical infection, aspiration, or less commonly, drug reaction. Small probable associated right parapneumonic effusion. No pneumothorax. The central airways are widely patent. Upper Abdomen: No acute abnormality. Musculoskeletal: No acute bone abnormality. No suspicious lytic or blastic bone lesion. Review of the MIP images confirms the above findings. IMPRESSION: No pulmonary embolism. Severe centrilobular emphysema. Interval development of extensive bibasilar pulmonary infiltrate and consolidation most in keeping with atypical infection in the acute setting with small associated right parapneumonic effusion. Less commonly, this may be seen with aspiration or drug reaction. Extensive mediastinal adenopathy may be reactive in nature given its relatively rapid development. Follow-up imaging in 3 months, once the patient acute issues have resolved, would be helpful in  documenting resolution. Extensive multi-vessel coronary artery calcification. Mild cardiomegaly with particular enlargement of the right ventricle. Dilation of the thoracic aorta with maximal dimension of 4.2 cm within the ascending segment. Recommend annual imaging followup by CTA or MRA. This recommendation follows 2010 ACCF/AHA/AATS/ACR/ASA/SCA/SCAI/SIR/STS/SVM Guidelines for the Diagnosis and Management of Patients with Thoracic Aortic Disease. Circulation. 2010; 121: Y174-B449. Aortic aneurysm NOS (ICD10-I71.9) Aortic aneurysm NOS (ICD10-I71.9). Aortic Atherosclerosis (ICD10-I70.0) and Emphysema (ICD10-J43.9). Electronically Signed   By: Fidela Salisbury MD   On: 11/28/2020 23:19   DG Chest Port 1 View  Result Date: 11/28/2020 CLINICAL DATA:  Chronic oxygen dependent patient presenting with a 1 week history of progressively worsening shortness of breath and hypoxemia (oxygen saturation in the 70s and 80s). Current history of UIP/pulmonary fibrosis. EXAM: PORTABLE CHEST 1 VIEW COMPARISON:  Chest x-ray 07/15/2020. High-resolution CT chest 09/26/2020. FINDINGS: Cardiac silhouette upper normal in size for AP portable technique. Interstitial opacities throughout both lungs which are not felt to be significantly changed since the high-resolution CT. More confluent opacity peripherally in the mid RIGHT lung was shown on the CT to represent scarring. IMPRESSION: Chronic interstitial lung disease/pulmonary fibrosis. No convincing evidence of acute cardiopulmonary disease. Electronically Signed   By: Evangeline Dakin M.D.   On: 11/28/2020 21:55   DG Swallowing Func-Speech Pathology  Result Date: 11/30/2020 Objective Swallowing Evaluation: Type of Study: MBS-Modified Barium Swallow Study  Patient Details Name: JHOSTIN EPPS MRN: [id number] Date of Birth: 16-Apr-1946 Today's Date: 11/30/2020 Time: SLP Start Time (ACUTE ONLY): 1045 -SLP Stop Time (ACUTE ONLY): 1115 SLP Time Calculation (min) (ACUTE ONLY): 30 min Past  Medical History: Past Medical History: Diagnosis Date . Adenomatous colon polyp  . Cataract  . Diabetes mellitus  . Emphysema  . Emphysema of lung ([place])  . Erectile dysfunction  . Hyperlipidemia  . Hypertension  . Peripheral vascular disease ([place])  . Pneumonia  . Pulmonary nodule  Past Surgical History: Past Surgical History: Procedure Laterality Date . ABDOMINAL AORTIC ANEURYSM REPAIR  2004 . COLONOSCOPY  multiple since 2004 HPI: KYSON KUPPER is a 75 y.o. male with a history of COPD, IPF recently started on 4L O2 at home, [facility] of right parotid s/p  excision with chronic wound, ITP, HTN, and hard of hearing who presented to the ED late 4/14 for progressive shortness of breath and hypoxia to 70-80%'s despite starting 4L O2 at home. CT Chest shows "Severe centrilobular and paraseptal emphysema, more  severe within the apices bilaterally. Since the prior examination,  there has developed extensive bibasilar ground-glass pulmonary  infiltrate and pulmonary consolidation, more severe within the right  lower lobe. Small right pleural effusion has developed. 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 On 06-03-20 Dr. Nicolette Bang performed rt parotidectomy with facial nerve dissection which ultimately req'd sacrifice of upper division of rt facial nerve. Rt selective neck dissection also that date.  Pt had three sessions of OP SLP therapy due to facial nerve paresis. Per ENT "He also recieved postoperative radiation therapy but did not f/u with SLP therapy after that.  Since that time he is always had some hearing problems and wears hearing aids but his hearing in his right ear has been worse.  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  No data recorded Assessment / Plan / Recommendation CHL IP CLINICAL IMPRESSIONS 11/30/2020 Clinical Impression Pt demonstrates a mild to moderate dysphagia with trace to mild sensed aspiration events that now pt reports are new over the past few days.  Oral deficits include anterior loss during chin tuck, base of tongue weakness, likely on the right leading to mild premature spillage during bolus formation with trace penetration events before the swallow and one instance of significant aspiration with hard cough. Pt also has decreased laryngeal closure during the swallow though epiglottic deflection and laryngal elevation with glottic clousre are quite complete and sustained in many attempts. Suspect decreased seal of arytenoid tissue leading to penetration during the swallow and eventual trace aspiration as glottic closure is released. Pt typically senses this with a throat clear or cough, though not all aspirate is cleared. Nectar thick liquids and puree are tolerated without aspiration and a cue for an effortful swallow eliminates mild vallecular residue that was otherwise present. Generally, this dysphagia is mild and possibly temporary if pt can improve conditioning and general strength and pulmonary hygiene. Recommend puree (pts baseline as he does not masticate solids since surgery)and nectar thick liquids during acute phase of rehabilitation. Will benefit from RMST, training for preventative cough strategy and strengthning for increased laryngeal closure as well as preventative oral hygiene measures. SLP Visit Diagnosis Dysphagia, oropharyngeal phase (R13.12) Attention and concentration deficit following -- Frontal lobe and executive function deficit following -- Impact on safety and function Moderate aspiration risk;Risk for inadequate nutrition/hydration   CHL IP TREATMENT RECOMMENDATION 11/30/2020 Treatment Recommendations Therapy as outlined in treatment plan below   Prognosis 11/30/2020  Prognosis for Safe Diet Advancement Good Barriers to Reach Goals -- Barriers/Prognosis Comment -- CHL IP DIET RECOMMENDATION 11/30/2020 SLP Diet Recommendations Dysphagia 1 (Puree) solids;Nectar thick liquid Liquid Administration via Straw Medication Administration Whole meds with liquid Compensations Slow rate;Small sips/bites;Effortful swallow;Clear throat after each swallow Postural Changes --   No flowsheet data found.  CHL IP FOLLOW UP RECOMMENDATIONS 11/30/2020 Follow up Recommendations Inpatient Rehab   CHL IP FREQUENCY AND DURATION 11/30/2020 Speech Therapy Frequency (ACUTE ONLY) min 2x/week Treatment Duration 2 weeks  CHL IP ORAL PHASE 11/30/2020 Oral Phase Impaired Oral - Pudding Teaspoon -- Oral - Pudding Cup -- Oral - Honey Teaspoon -- Oral - Honey Cup -- Oral - Nectar Teaspoon -- Oral - Nectar Cup -- Oral - Nectar Straw Premature spillage Oral - Thin Teaspoon -- Oral - Thin Cup -- Oral - Thin Straw Premature spillage;Right anterior bolus loss Oral - Puree WFL Oral - Mech Soft -- Oral - Regular -- Oral - Multi-Consistency -- Oral - Pill -- Oral Phase - Comment --  CHL IP PHARYNGEAL PHASE 11/30/2020 Pharyngeal Phase Impaired Pharyngeal- Pudding Teaspoon -- Pharyngeal -- Pharyngeal- Pudding Cup -- Pharyngeal -- Pharyngeal- Honey Teaspoon -- Pharyngeal -- Pharyngeal- Honey Cup -- Pharyngeal -- Pharyngeal- Nectar Teaspoon -- Pharyngeal -- Pharyngeal- Nectar Cup -- Pharyngeal -- Pharyngeal- Nectar Straw Reduced epiglottic inversion;Reduced tongue base retraction;Pharyngeal residue - valleculae Pharyngeal -- Pharyngeal- Thin Teaspoon -- Pharyngeal -- Pharyngeal- Thin Cup -- Pharyngeal -- Pharyngeal- Thin Straw Trace aspiration;Penetration/Aspiration before swallow;Penetration/Aspiration during swallow;Reduced airway/laryngeal closure;Reduced epiglottic inversion Pharyngeal -- Pharyngeal- Puree Pharyngeal residue - valleculae Pharyngeal -- Pharyngeal- Mechanical Soft -- Pharyngeal -- Pharyngeal- Regular --  Pharyngeal -- Pharyngeal- Multi-consistency -- Pharyngeal -- Pharyngeal- Pill -- Pharyngeal -- Pharyngeal Comment --  No flowsheet data found. DeBlois, Katherene Ponto 11/30/2020, 11:55 AM              ECHOCARDIOGRAM COMPLETE  Result Date: 11/29/2020    ECHOCARDIOGRAM REPORT   Patient Name:   DALEY GOSSE Date of Exam: 11/29/2020 Medical Rec #:  [id number]       Height:       72.0 in Accession #:    [id number]      Weight:       143.8 lb Date of Birth:  1945-11-30        BSA:          1.852 m Patient Age:    6 years        BP:           94/54 mmHg Patient Gender: M               HR:           65 bpm. Exam Location:  Inpatient Procedure: 2D Echo, Cardiac Doppler and Color Doppler Indications:    COPD  History:        Patient has prior history of Echocardiogram examinations, most                 recent 08/26/2017. Risk Factors:Dyslipidemia, Hypertension and                 Diabetes.  Sonographer:    Luisa Hart RDCS Referring Phys: [place]  1. Left ventricular ejection fraction, by estimation, is 65 to 70%. The left ventricle has normal function. The left ventricle has no regional wall motion abnormalities. Left ventricular diastolic parameters were normal.  2. Right ventricular systolic function is low normal. The right ventricular size is moderately enlarged. There is mildly elevated pulmonary artery systolic pressure.  3. Left atrial size was mild to moderately dilated.  4. Right atrial size was mild to moderately dilated.  5. The mitral valve is grossly normal. Trivial mitral valve regurgitation.  6. The aortic valve is tricuspid. There is mild calcification of the aortic valve. There is mild thickening of the aortic valve. Aortic valve regurgitation is not visualized. Mild aortic valve sclerosis is present, with no evidence of aortic valve stenosis.  7. The inferior vena  cava is dilated in size with <50% respiratory variability, suggesting right atrial pressure of 15 mmHg.  Comparison(s): Prior images unable to be directly viewed, comparison made by report only. Conclusion(s)/Recommendation(s): RV moderately enlarged, with mildly elevated RVSP. Normal LVEF. FINDINGS  Left Ventricle: Left ventricular ejection fraction, by estimation, is 65 to 70%. The left ventricle has normal function. The left ventricle has no regional wall motion abnormalities. The left ventricular internal cavity size was normal in size. There is  borderline left ventricular hypertrophy. Left ventricular diastolic parameters were normal. Right Ventricle: The right ventricular size is moderately enlarged. Right vetricular wall thickness was not well visualized. Right ventricular systolic function is low normal. There is mildly elevated pulmonary artery systolic pressure. The tricuspid regurgitant velocity is 2.57 m/s, and with an assumed right atrial pressure of 15 mmHg, the estimated right ventricular systolic pressure is 24.4 mmHg. Left Atrium: Left atrial size was mild to moderately dilated. Right Atrium: Right atrial size was mild to moderately dilated. Pericardium: There is no evidence of pericardial effusion. Mitral Valve: The mitral valve is grossly normal. Trivial mitral valve regurgitation. Tricuspid Valve: The tricuspid valve is normal in structure. Tricuspid valve regurgitation is mild. Aortic Valve: The aortic valve is tricuspid. There is mild calcification of the aortic valve. There is mild thickening of the aortic valve. Aortic valve regurgitation is not visualized. Mild aortic valve sclerosis is present, with no evidence of aortic valve stenosis. Aortic valve mean gradient measures 6.0 mmHg. Aortic valve peak gradient measures 11.8 mmHg. Aortic valve area, by VTI measures 2.51 cm. Pulmonic Valve: The pulmonic valve was grossly normal. Pulmonic valve regurgitation is not visualized. No evidence of pulmonic stenosis. Aorta: The aortic root and ascending aorta are structurally normal, with no evidence of  dilitation. Venous: The inferior vena cava is dilated in size with less than 50% respiratory variability, suggesting right atrial pressure of 15 mmHg. IAS/Shunts: The atrial septum is grossly normal.  LEFT VENTRICLE PLAX 2D LVIDd:         4.66 cm     Diastology LVIDs:         2.88 cm     LV e' medial:    7.72 cm/s LV PW:         1.20 cm     LV E/e' medial:  6.0 LV IVS:        0.70 cm     LV e' lateral:   10.40 cm/s LVOT diam:     2.60 cm     LV E/e' lateral: 4.5 LV SV:         100 LV SV Index:   54 LVOT Area:     5.31 cm  LV Volumes (MOD) LV vol d, MOD A2C: 85.5 ml LV vol s, MOD A2C: 28.9 ml LV SV MOD A2C:     56.6 ml RIGHT VENTRICLE RV S prime:     10.10 cm/s  PULMONARY VEINS TAPSE (M-mode): 1.6 cm      A Reversal Duration: 77.00 msec                             A Reversal Velocity: 16.80 cm/s                             Diastolic Velocity:  01.02 cm/s  S/D Velocity:        1.40                             Systolic Velocity:   16.10 cm/s AORTIC VALVE                    PULMONIC VALVE AV Area (Vmax):    2.58 cm     PV Vmax:       0.94 m/s AV Area (Vmean):   2.81 cm     PV Vmean:      79.900 cm/s AV Area (VTI):     2.51 cm     PV VTI:        0.263 m AV Vmax:           172.00 cm/s  PV Peak grad:  3.5 mmHg AV Vmean:          113.000 cm/s PV Mean grad:  3.0 mmHg AV VTI:            0.400 m AV Peak Grad:      11.8 mmHg AV Mean Grad:      6.0 mmHg LVOT Vmax:         83.50 cm/s LVOT Vmean:        59.900 cm/s LVOT VTI:          0.189 m LVOT/AV VTI ratio: 0.47  AORTA Ao Root diam: 4.00 cm Ao Asc diam:  3.70 cm MITRAL VALVE               TRICUSPID VALVE MV Area (PHT): 2.50 cm    TR Peak grad:   26.4 mmHg MV Decel Time: 303 msec    TR Vmax:        257.00 cm/s MV E velocity: 46.70 cm/s MV A velocity: 83.90 cm/s  SHUNTS MV E/A ratio:  0.56        Systemic VTI:  0.19 m                            Systemic Diam: 2.60 cm Buford Dresser MD Electronically signed by Buford Dresser MD  Signature Date/Time: 11/29/2020/6:11:02 PM    Final      Subjective: Seen and examined at bedside and patient was doing better from respiratory status.  States that he has oxygen at home that has been arranged.  No nausea or vomiting.  Denies any lightheadedness or dizziness.  No other concerns or complaints at this time.  Ready to go home.  Discharge Exam: Vitals:   12/04/20 0754 12/04/20 1358  BP:  105/61  Pulse:  75  Resp:  (!) 22  Temp:  (!) 97.4 F (36.3 C)  SpO2: 92% 100%   Vitals:   12/04/20 0553 12/04/20 0750 12/04/20 0754 12/04/20 1358  BP: 120/65   105/61  Pulse: 73   75  Resp: (!) 22   (!) 22  Temp: (!) 97.5 F (36.4 C)   (!) 97.4 F (36.3 C)  TempSrc: Oral   Oral  SpO2: 100% (!) 81% 92% 100%   General: Pt is alert, awake, not in acute distress; he is extremely hard of hearing Cardiovascular: RRR, S1/S2 +, no rubs, no gallops Respiratory: Diminished bilaterally, no wheezing, no rhonchi; wearing supplemental oxygen via nasal cannula Abdominal: Soft, NT, ND, bowel sounds + Extremities: no edema, no cyanosis   The results of significant diagnostics from  this hospitalization (including imaging, microbiology, ancillary and laboratory) are listed below for reference.    Microbiology: Recent Results (from the past 240 hour(s))  Resp Panel by RT-PCR (Flu A&B, Covid) Nasopharyngeal Swab     Status: None   Collection Time: 11/28/20 10:03 PM   Specimen: Nasopharyngeal Swab; Nasopharyngeal(NP) swabs in vial transport medium  Result Value Ref Range Status   SARS Coronavirus 2 by RT PCR NEGATIVE NEGATIVE Final    Comment: (NOTE) SARS-CoV-2 target nucleic acids are NOT DETECTED.  The SARS-CoV-2 RNA is generally detectable in upper respiratory specimens during the acute phase of infection. The lowest concentration of SARS-CoV-2 viral copies this assay can detect is 138 copies/mL. A negative result does not preclude SARS-Cov-2 infection and should not be used as the sole  basis for treatment or other patient management decisions. A negative result may occur with  improper specimen collection/handling, submission of specimen other than nasopharyngeal swab, presence of viral mutation(s) within the areas targeted by this assay, and inadequate number of viral copies(<138 copies/mL). A negative result must be combined with clinical observations, patient history, and epidemiological information. The expected result is Negative.  Fact Sheet for Patients:  EntrepreneurPulse.com.au  Fact Sheet for Healthcare Providers:  IncredibleEmployment.be  This test is no t yet approved or cleared by the [place] FDA and  has been authorized for detection and/or diagnosis of SARS-CoV-2 by FDA under an Emergency Use Authorization (EUA). This EUA will remain  in effect (meaning this test can be used) for the duration of the COVID-19 declaration under Section 564(b)(1) of the Act, 21 U.S.C.section 360bbb-3(b)(1), unless the authorization is terminated  or revoked sooner.       Influenza A by PCR NEGATIVE NEGATIVE Final   Influenza B by PCR NEGATIVE NEGATIVE Final    Comment: (NOTE) The Xpert Xpress SARS-CoV-2/FLU/RSV plus assay is intended as an aid in the diagnosis of influenza from Nasopharyngeal swab specimens and should not be used as a sole basis for treatment. Nasal washings and aspirates are unacceptable for Xpert Xpress SARS-CoV-2/FLU/RSV testing.  Fact Sheet for Patients: EntrepreneurPulse.com.au  Fact Sheet for Healthcare Providers: IncredibleEmployment.be  This test is not yet approved or cleared by the [place] FDA and has been authorized for detection and/or diagnosis of SARS-CoV-2 by FDA under an Emergency Use Authorization (EUA). This EUA will remain in effect (meaning this test can be used) for the duration of the COVID-19 declaration under Section 564(b)(1) of the Act,  21 U.S.C. section 360bbb-3(b)(1), unless the authorization is terminated or revoked.  Performed at [facility], [place] [address], [place], [place] [address]   Culture, blood (routine x 2) Call MD if unable to obtain prior to antibiotics being given     Status: None   Collection Time: 11/29/20  1:03 AM   Specimen: BLOOD  Result Value Ref Range Status   Specimen Description   Final    BLOOD RIGHT ANTECUBITAL Performed at [place] [address] [place], [place] [address]    Special Requests   Final    BOTTLES DRAWN AEROBIC ONLY Blood Culture results may not be optimal due to an excessive volume of blood received in culture bottles Performed at [place] [address], [place], [place] [address]    Culture   Final    NO GROWTH 5 DAYS Performed at [facility], [place] [address] [place], [place] [address]    Report Status 12/04/2020 FINAL  Final  Culture, blood (routine x 2) Call MD if unable to obtain prior to antibiotics being given     Status: None   Collection Time: 11/29/20  1:03 AM   Specimen: BLOOD RIGHT HAND  Result Value Ref Range Status   Specimen Description   Final    BLOOD RIGHT HAND Performed at [place] [address] [place], [place] [address]    Special Requests   Final    BOTTLES DRAWN AEROBIC ONLY Blood Culture results may not be optimal due to an excessive volume of blood received in culture bottles Performed at [place] [address] [place], [place] [address]    Culture   Final    NO GROWTH 5 DAYS Performed at [facility], [place] [address], [place], [place] [address]    Report Status 12/04/2020 FINAL  Final     Labs: BNP (last 3 results) Recent Labs    11/28/20 2149  BNP 096.2*   Basic Metabolic Panel: Recent Labs  Lab 11/28/20 2149 11/28/20 2204 11/29/20 0103 12/01/20 0759 12/01/20 1753 12/02/20 0514 12/03/20 0602  12/04/20 0516  NA 134*   < > 131* 135  --  136 135 135  K 4.1   < > 4.1 3.7  --  3.4* 4.0 4.3  CL 103   < > 103 102  --  101 102 101  CO2 23  --  20* 25  --  28 28 29   GLUCOSE 212*   < > 197* 213* 320* 167* 143* 196*  BUN 24*   < > 21 18  --  23 23 22   CREATININE 1.05   < > 0.90 0.75  --  0.56* 0.50* 0.54*  CALCIUM 8.4*  --  8.2* 7.9*  --  8.0* 7.8* 8.0*  MG 2.1  --   --   --   --   --  1.9  --    < > = values in this interval not displayed.   Liver Function Tests: Recent Labs  Lab 11/28/20 2149 11/29/20 0103 12/01/20 0759  AST 27 24 24   ALT 27 25 26   ALKPHOS 89 84 71  BILITOT 1.8* 1.8* 0.7  PROT 7.2 7.1 6.3*  ALBUMIN 2.5* 2.5* 2.2*   No results for input(s): LIPASE, AMYLASE in the last 168 hours. No results for input(s): AMMONIA in the last 168 hours. CBC: Recent Labs  Lab 11/28/20 2149 11/28/20 2204 11/29/20 0103 12/01/20 0716 12/02/20 0514 12/03/20 0602 12/04/20 0516  WBC 6.1  --  6.3 3.0* 4.3 3.7* 3.3*  NEUTROABS 3.4  --  3.9  --  2.7  --   --   HGB 9.6*   < > 9.0* 8.4* 8.4* 9.1* 9.5*  HCT 30.9*   < > 29.1* 28.0* 27.8* 29.8* 30.6*  MCV 81.3  --  81.3 82.4 83.2 83.5 82.7  PLT 82*  --  78* 63* 62* 63* 62*   < > = values in this interval not displayed.   Cardiac Enzymes: No results for input(s): CKTOTAL, CKMB, CKMBINDEX, TROPONINI in the last 168 hours. BNP: Invalid input(s): POCBNP CBG: Recent Labs  Lab 12/03/20 1636 12/03/20 1830 12/03/20 2144 12/04/20 0750 12/04/20 1123  GLUCAP 426* 315* 233* 147* 247*   D-Dimer No results for input(s): DDIMER in the last 72 hours. Hgb A1c No results for input(s): HGBA1C in the last 72 hours. Lipid Profile No results for input(s): CHOL, HDL, LDLCALC, TRIG, CHOLHDL, LDLDIRECT in the last 72  hours. Thyroid function studies No results for input(s): TSH, T4TOTAL, T3FREE, THYROIDAB in the last 72 hours.  Invalid input(s): FREET3 Anemia work up No results for input(s): VITAMINB12, FOLATE, FERRITIN, TIBC, IRON,  RETICCTPCT in the last 72 hours. Urinalysis    Component Value Date/Time   COLORURINE AMBER (A) 11/29/2020 0026   APPEARANCEUR CLEAR 11/29/2020 0026   LABSPEC >1.046 (H) 11/29/2020 0026   PHURINE 6.0 11/29/2020 0026   GLUCOSEU NEGATIVE 11/29/2020 0026   HGBUR NEGATIVE 11/29/2020 0026   BILIRUBINUR NEGATIVE 11/29/2020 0026   KETONESUR NEGATIVE 11/29/2020 0026   PROTEINUR NEGATIVE 11/29/2020 0026   NITRITE NEGATIVE 11/29/2020 0026   LEUKOCYTESUR NEGATIVE 11/29/2020 0026   Sepsis Labs Invalid input(s): PROCALCITONIN,  WBC,  LACTICIDVEN Microbiology Recent Results (from the past 240 hour(s))  Resp Panel by RT-PCR (Flu A&B, Covid) Nasopharyngeal Swab     Status: None   Collection Time: 11/28/20 10:03 PM   Specimen: Nasopharyngeal Swab; Nasopharyngeal(NP) swabs in vial transport medium  Result Value Ref Range Status   SARS Coronavirus 2 by RT PCR NEGATIVE NEGATIVE Final    Comment: (NOTE) SARS-CoV-2 target nucleic acids are NOT DETECTED.  The SARS-CoV-2 RNA is generally detectable in upper respiratory specimens during the acute phase of infection. The lowest concentration of SARS-CoV-2 viral copies this assay can detect is 138 copies/mL. A negative result does not preclude SARS-Cov-2 infection and should not be used as the sole basis for treatment or other patient management decisions. A negative result may occur with  improper specimen collection/handling, submission of specimen other than nasopharyngeal swab, presence of viral mutation(s) within the areas targeted by this assay, and inadequate number of viral copies(<138 copies/mL). A negative result must be combined with clinical observations, patient history, and epidemiological information. The expected result is Negative.  Fact Sheet for Patients:  EntrepreneurPulse.com.au  Fact Sheet for Healthcare Providers:  IncredibleEmployment.be  This test is no t yet approved or cleared by the  [place] FDA and  has been authorized for detection and/or diagnosis of SARS-CoV-2 by FDA under an Emergency Use Authorization (EUA). This EUA will remain  in effect (meaning this test can be used) for the duration of the COVID-19 declaration under Section 564(b)(1) of the Act, 21 U.S.C.section 360bbb-3(b)(1), unless the authorization is terminated  or revoked sooner.       Influenza A by PCR NEGATIVE NEGATIVE Final   Influenza B by PCR NEGATIVE NEGATIVE Final    Comment: (NOTE) The Xpert Xpress SARS-CoV-2/FLU/RSV plus assay is intended as an aid in the diagnosis of influenza from Nasopharyngeal swab specimens and should not be used as a sole basis for treatment. Nasal washings and aspirates are unacceptable for Xpert Xpress SARS-CoV-2/FLU/RSV testing.  Fact Sheet for Patients: EntrepreneurPulse.com.au  Fact Sheet for Healthcare Providers: IncredibleEmployment.be  This test is not yet approved or cleared by the [place] FDA and has been authorized for detection and/or diagnosis of SARS-CoV-2 by FDA under an Emergency Use Authorization (EUA). This EUA will remain in effect (meaning this test can be used) for the duration of the COVID-19 declaration under Section 564(b)(1) of the Act, 21 U.S.C. section 360bbb-3(b)(1), unless the authorization is terminated or revoked.  Performed at [facility], [place] [address], [place], [place] [address]   Culture, blood (routine x 2) Call MD if unable to obtain prior to antibiotics being given     Status: None   Collection Time: 11/29/20  1:03 AM   Specimen: BLOOD  Result Value Ref Range Status  Specimen Description   Final    BLOOD RIGHT ANTECUBITAL Performed at [place] [address], [place], [place] [address]    Special Requests   Final    BOTTLES DRAWN AEROBIC ONLY Blood Culture results may not be optimal due to an excessive volume of blood  received in culture bottles Performed at [place] [address] [place], [place] [address]    Culture   Final    NO GROWTH 5 DAYS Performed at [facility], [place] [address] [place], [place] [address]    Report Status 12/04/2020 FINAL  Final  Culture, blood (routine x 2) Call MD if unable to obtain prior to antibiotics being given     Status: None   Collection Time: 11/29/20  1:03 AM   Specimen: BLOOD RIGHT HAND  Result Value Ref Range Status   Specimen Description   Final    BLOOD RIGHT HAND Performed at [place] [address], [place], [place] [address]    Special Requests   Final    BOTTLES DRAWN AEROBIC ONLY Blood Culture results may not be optimal due to an excessive volume of blood received in culture bottles Performed at [place] [address] [place], [place] [address]    Culture   Final    NO GROWTH 5 DAYS Performed at [facility], [place] [address], [place], [place] [address]    Report Status 12/04/2020 FINAL  Final   Time coordinating discharge: 35 minutes  SIGNED:  Kerney Elbe, DO [facility] 12/04/2020, 2:00 PM Pager is on [place]  If 7PM-7AM, please contact night-coverage [email or web address]

## 2020-12-04 NOTE — Progress Notes (Signed)
Speech Language Pathology Treatment: Dysphagia  Patient Details Name: Geoffrey Wood MRN: [id number] DOB: 08-28-1945 Today's Date: 12/04/2020 Time: 0935-1010 SLP Time Calculation (min) (ACUTE ONLY): 35 min  Assessment / Plan / Recommendation Clinical Impression  Second session for education for use of thickener with demonstration of thickening soda to pt. Pt reports significant satisfaction with soda that is between nectar/thin liquid viscocity.  Pt also informed to maintain strength of cough/"hock" for airway protection.  Further advised pt to assure oral cavity is clear after meals due to wound and xerostomia - to help with healing and oral hygiene.  COPD, decreased mobility and compromised immune system increases his risk of aspiration pna if he does aspirate.  Recommend to consider conducting RMST in the future to aid laryngeal closure - however doubtful he is candidate currently due to his current oral wound.   If pt to drink thin liquid = recommend thin water.  Return demonstration of universal sign of choking and heimlich manuever procedure provided in writing.  Finally, SLP implored pt to importance to continue his exercises and to eat as much as able to continue motor planning/muscular strength with swallowing.  All education completed and pt reports being satisfied with information shared.  He advises he will conduct his swallowing exercises - decreased laryngeal closure noted on MBS, thus lingual press or Shaker exercise advised.  Pt has made significant progress in understanding his dysphagia and clinical reasoning for precautions, etc.  Thanks for allowing me to help with this pt's care plan.     HPI HPI: Geoffrey Wood is a 75 y.o. 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 CT Chest shows "Severe centrilobular and paraseptal emphysema, more  severe within the apices bilaterally. Since the prior examination,  there has developed extensive bibasilar ground-glass pulmonary  infiltrate and pulmonary consolidation, more severe within the right  lower lobe. Small right pleural effusion has developed. 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 On 06-03-20 Dr. Nicolette Bang performed rt parotidectomy with facial nerve dissection which ultimately req'd sacrifice of upper division of rt facial nerve. Rt selective neck dissection also that date.  Pt had three sessions of OP SLP therapy due to facial nerve paresis. Per ENT "He also recieved postoperative radiation therapy but did not f/u with SLP therapy after that.  Since that time he is always had some hearing problems and wears hearing aids but his hearing in his right ear has been worse.  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      SLP Plan  All goals met       Recommendations  Diet recommendations: Dysphagia 3 (mechanical soft);Other(comment) (between thin and nectar) Liquids provided via: Straw Medication Administration: Whole meds with puree Supervision: Patient able to self feed Compensations: Slow rate;Small sips/bites;Effortful swallow;Clear throat after each swallow Postural Changes and/or Swallow Maneuvers: Seated upright 90 degrees      Patient may use Passy-Muir Speech Valve: Intermittently with supervision  Oral Care Recommendations: Patient independent with oral care Follow up Recommendations: None SLP Visit Diagnosis: Dysphagia, oropharyngeal phase (R13.12) Plan: All goals  met       GO                [name] 12/04/2020, 11:19 AM  Kathleen Lime, MS [facility] SLP Acute Rehab Services Office [phone] Pager [phone]

## 2020-12-04 NOTE — Progress Notes (Signed)
Inpatient Diabetes Program Recommendations  AACE/ADA: New Consensus Statement on Inpatient Glycemic Control (2015)  Target Ranges:  Prepandial:   less than 140 mg/dL      Peak postprandial:   less than 180 mg/dL (1-2 hours)      Critically ill patients:  140 - 180 mg/dL   Lab Results  Component Value Date   GLUCAP 147 (H) 12/04/2020   HGBA1C 5.5 11/29/2020    Review of Glycemic Control Results for Geoffrey Wood, Geoffrey Wood (MRN [id number]) as of 12/04/2020 09:36  Ref. Range 12/03/2020 07:51 12/03/2020 11:47 12/03/2020 16:36 12/03/2020 18:30 12/03/2020 21:44 12/04/2020 07:50  Glucose-Capillary Latest Ref Range: 70 - 99 mg/dL 98 248 (H) 426 (H) 315 (H) 233 (H) 147 (H)   Diabetes history: None Outpatient Diabetes medications:  None Current orders for Inpatient glycemic control:  Novolog resistant tid with meals and HS Novolog 7 units tid with meals  Lantus 5 units q HS Prednisone 40 mg daily Inpatient Diabetes Program Recommendations:   Agree with current orders.   Thanks,  Adah Perl, RN, BC-ADM Inpatient Diabetes Coordinator Pager [phone] (8a-5p)

## 2020-12-04 NOTE — Progress Notes (Signed)
PCCM Interval Progress Note  No acute events. Vitals stable. Sats 100% on 2 - 4 L, being weaned as able.  F/u arranged for 4/27 with Tammy Parrett in our office. Please call if any additional needs prior to d/c.  No charge.   Montey Hora, [facility] For pager details, please see AMION or use Epic chat  After 1900, please call [facility] for cross coverage needs 12/04/2020, 7:42 AM

## 2020-12-04 NOTE — Progress Notes (Signed)
Patient will be going home with wife later this afternoon. Will educate wife on how to use portable oxygen tank, medications and insulin. Belongings were returned to patient.

## 2020-12-04 NOTE — TOC Transition Note (Signed)
Transition of Care [facility]) - CM/SW Discharge Note   Patient Details  Name: Geoffrey Wood MRN: [id number] Date of Birth: 23-Sep-1945  Transition of Care [facility]) CM/SW Contact:  Trish Mage, LCSW Phone Number: 12/04/2020, 2:13 PM   Clinical Narrative:   Patient who is stable for discharge will return home today, has a ride home.  Wife instructed to bring travel cannister of O2 with her and we will show both she and husband how to use it.  She also has questions about diet and blood sugar which I assured her RN would go over with her when she comes to pick up her husband.  She was greatly relieved.  [name] services have been arranged.  NO further needs identified. TOC sign off.    Final next level of care: Home w Home Health Services Barriers to Discharge: No Barriers Identified   Patient Goals and CMS Choice Patient states their goals for this hospitalization and ongoing recovery are:: "I hope to go home tomorrow."   Choice offered to / list presented to : Spouse  Discharge Placement                       Discharge Plan and Services In-house Referral: Clinical Social Work   Post Acute Care Choice: Home Health                               Social Determinants of Health (SDOH) Interventions     Readmission Risk Interventions No flowsheet data found.

## 2020-12-04 NOTE — Progress Notes (Signed)
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   Pt uses straw and holds lips closed to aid suction, SLP Instructed him to place straw on his strong side to help seal without use of hands for hygiene.  Pt educated to findings of MBS, H20 being pH neutral and thus safest to aspirate, importance of exercises to help decreased muscle fibrosis from XRT.  Given pt's hearing loss, SLP wrote all information for him and his wife. Using teach back pt informed of recommendations.  Observed pt consuming soda liquid between nectar and thin thick - no indication of aspiration and pt responded to mid verbal cues for effortful swallow.  Pt reports significant disatisfaction with diet - more with liquids than solids as he states liquids are "too thick".  Pt concurs that he was largely consuming protein shakes prior to admission.  He desires however to consume solids and SLP observed him consuming graham cracker softened with applesauce - place in left oral cavity.  Adequate oral clearance noted and per review of MBS, swallow was largely timely with minimal retention.  HPI HPI: Geoffrey Wood is a 75 y.o. 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 CT Chest shows "Severe centrilobular and paraseptal emphysema, more  severe within the apices bilaterally. Since the prior examination,  there has developed extensive bibasilar ground-glass pulmonary  infiltrate and  pulmonary consolidation, more severe within the right  lower lobe. Small right pleural effusion has developed. 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 On 06-03-20 Dr. Nicolette Bang performed rt parotidectomy with facial nerve dissection which ultimately req'd sacrifice of upper division of rt facial nerve. Rt selective neck dissection also that date.  Pt had three sessions of OP SLP therapy due to facial nerve paresis. Per ENT "He also recieved postoperative radiation therapy but did not f/u with SLP therapy after that.  Since that time he is always had some hearing problems and wears hearing aids but his hearing in his right ear has been worse.  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      SLP Plan  Continue with current plan of care       Recommendations  Diet recommendations: Dysphagia 3 (mechanical soft);Nectar-thick liquid (liquids between thin and nectar) Liquids provided via: Straw Medication Administration: Whole meds with liquid Supervision: Patient able to self feed Compensations: Slow rate;Small sips/bites;Effortful swallow;Clear throat after each swallow (masticate on left side) Postural Changes and/or Swallow Maneuvers: Seated upright 90 degrees      Patient may use Passy-Muir Speech Valve: Intermittently with supervision         Oral Care Recommendations: Patient independent with oral care (oral care after meals to assure clear given  pt's wound) SLP Visit Diagnosis: Dysphagia, oropharyngeal phase (R13.12) Plan: Continue with current plan of care       GO                Geoffrey Wood 12/04/2020, 11:09 AM   Kathleen Lime, MS [facility] SLP Acute Rehab Services Office  [phone] Pager [phone]

## 2020-12-05 ENCOUNTER — Other Ambulatory Visit: Payer: Self-pay

## 2020-12-05 ENCOUNTER — Telehealth: Payer: Self-pay

## 2020-12-05 ENCOUNTER — Ambulatory Visit (HOSPITAL_COMMUNITY)
Admission: RE | Admit: 2020-12-05 | Discharge: 2020-12-05 | Disposition: A | Discharge status: Home / Self Care | Payer: Medicare Other | Source: Ambulatory Visit | Attending: Radiation Oncology | Admitting: Radiation Oncology

## 2020-12-05 ENCOUNTER — Other Ambulatory Visit (HOSPITAL_COMMUNITY): Payer: Self-pay

## 2020-12-05 DIAGNOSIS — C07 Malignant neoplasm of parotid gland: Secondary | ICD-10-CM | POA: Insufficient documentation

## 2020-12-05 MED ORDER — IOHEXOL 300 MG/ML  SOLN
75.0000 mL | Freq: Once | INTRAMUSCULAR | Status: AC | PRN
  Administered 2020-12-05: 75 mL via INTRAVENOUS

## 2020-12-05 NOTE — Telephone Encounter (Signed)
Received call from patient's wife that he had a difficult time getting to the hospital for his CT scan while managing his new oxygen equipment, and she doesn't think they will be able to come tomorrow for patient's F/U with [name]. Asked if they would be willing to do a MyChart video visit so they could still speak with [name] and review patient's scan together. She agreed to appointment change and knows to call me back directly should they have any issues/concerns before tomorrow's appointment. No other needs identified at this time

## 2020-12-05 NOTE — Clinical Social Work Note (Signed)
Received call from someone within [facility] system expressing concern for patient and wife as they remain uncomfortable with use of O2 equipment.  I contacted Caryl Pina with [employer] [phone] and asked him to follow up.  Although he is not directly responsible for this case as he is based out of [place] and their supplier is out of [place], he agreed to follow up and make sure concerns were addressed.

## 2020-12-06 ENCOUNTER — Ambulatory Visit: Payer: Medicare Other | Admitting: Radiation Oncology

## 2020-12-06 ENCOUNTER — Other Ambulatory Visit: Payer: Medicare Other | Admitting: General Practice

## 2020-12-06 ENCOUNTER — Ambulatory Visit
Admission: RE | Admit: 2020-12-06 | Discharge: 2020-12-06 | Disposition: A | Discharge status: Home / Self Care | Payer: Medicare Other | Source: Ambulatory Visit | Attending: Radiation Oncology | Admitting: Radiation Oncology

## 2020-12-06 ENCOUNTER — Telehealth: Payer: Self-pay | Admitting: *Deleted

## 2020-12-06 DIAGNOSIS — C07 Malignant neoplasm of parotid gland: Secondary | ICD-10-CM

## 2020-12-06 NOTE — Progress Notes (Addendum)
Radiation Oncology         [phone] ________________________________  Name: Geoffrey Wood MRN: [id number]  Date: 12/06/2020  DOB: 1945/12/17  Outpatient Follow-Up Visit Note by Telemedicine to maximize safety during the pandemic.  MyChart video was used at the request of the patient and his wife.  CC: Geoffrey Infante, MD  Geoffrey Ames, MD  Diagnosis and Prior Radiotherapy:       ICD-10-CM   1. 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  They requested telemedicine because they are having issues with home oxygen. He was recently discharged from the hospital on April 20 after a 6-day admission.   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  He continues to struggle with some weight loss despite ingesting 3000- 4000 cal a day by mouth.  Also having some issues with hyperglycemia while on steroids. Per the patient's wife, he had a colonoscopy in the past 2-3 years, WNL.  BM's are unremarkable.  She is very worried about his failure to thrive.  She is interested in getting second opinions that are university based from pulmonology and hematology.  There were no vitals filed for this visit.                     ALLERGIES:  has No Known Allergies.  Meds: Current Outpatient Medications  Medication Sig Dispense Refill  . acetaminophen (TYLENOL) 325 MG tablet Take 325 mg by mouth every 6 (six) hours as needed for mild pain.    Marland Kitchen albuterol (PROVENTIL) (2.5 MG/3ML) 0.083% nebulizer solution Take 3 mLs (2.5 mg total) by nebulization every 4 (four) hours as needed for shortness  of breath. 75 mL 12  . ANORO ELLIPTA 62.5-25 MCG/INH AEPB Take 1 puff by mouth daily.    . Ascorbic Acid (VITAMIN C) 500 MG CAPS Take 1 capsule by mouth 2 (two) times daily.    Marland Kitchen b complex vitamins capsule Take 1 capsule by mouth daily.    . Cholecalciferol (VITAMIN D PO) Take 800 Units by mouth daily.    . Cyanocobalamin (VITAMIN B 12 PO) Take 500 mcg by mouth daily.    . Maltodextrin-Xanthan Gum (RESOURCE THICKENUP CLEAR) POWD Take 1 Container by mouth as needed. 1250 g 0  . mirtazapine (REMERON) 15 MG tablet Take 45 mg by mouth at bedtime.    . Multiple Vitamin (MULTIVITAMIN WITH MINERALS) TABS tablet Take 1 tablet by mouth daily. 30 tablet 0  . Nutritional Supplements (FEEDING SUPPLEMENT, KATE FARMS STANDARD 1.4,) LIQD liquid Take 325 mLs by mouth 3 (three) times daily between meals. 3250 mL 0  . ONETOUCH VERIO test strip     . pantoprazole (PROTONIX) 40 MG tablet Take 1 tablet (40 mg total) by mouth daily. 30 tablet 0  . predniSONE (DELTASONE) 20 MG tablet Take 2 tablets (40 mg total) by mouth daily with breakfast. 30 tablet 0  . protein supplement (RESOURCE BENEPROTEIN) 6 g POWD Take 1 Scoop (6 g total) by mouth 3 (three) times daily with meals. 100 packet 0   No current facility-administered medications for this encounter.   Facility-Administered Medications  Ordered in Other Encounters  Medication Dose Route Frequency Provider Last Rate Last Admin  . 0.9 %  sodium chloride infusion   Intravenous Continuous Magrinat, Virgie Dad, MD 500 mL/hr at 11/26/20 1712 New Bag at 11/26/20 1712  . acetaminophen (TYLENOL) tablet 650 mg  650 mg Oral Once Brunetta Genera, MD      . albuterol (PROVENTIL) (2.5 MG/3ML) 0.083% nebulizer solution 2.5 mg  2.5 mg Nebulization Q4H PRN Brunetta Genera, MD      . loratadine (CLARITIN) tablet 10 mg  10 mg Oral Once Brunetta Genera, MD        Physical Findings: The patient is in no acute distress.   Wt Readings from Last 3 Encounters:  11/26/20  143 lb 12 oz (65.2 kg)  11/12/20 143 lb 9.6 oz (65.1 kg)  10/08/20 138 lb (62.6 kg)    vitals were not taken for this visit. .  General:  in no acute distress, hard of hearing.  His wife provides the history  Lab Findings: Lab Results  Component Value Date   WBC 3.3 (L) 12/04/2020   HGB 9.5 (L) 12/04/2020   HCT 30.6 (L) 12/04/2020   MCV 82.7 12/04/2020   PLT 62 (L) 12/04/2020    Lab Results  Component Value Date   TSH 1.148 07/09/2020    Radiographic Findings: CT Soft Tissue Neck W Contrast  Result Date: 12/05/2020 CLINICAL DATA:  Carcinoma of the right parotid gland EXAM: CT NECK WITH CONTRAST TECHNIQUE: Multidetector CT imaging of the neck was performed using the standard protocol following the bolus administration of intravenous contrast. CONTRAST:  76mL OMNIPAQUE IOHEXOL 300 MG/ML  SOLN COMPARISON:  None. FINDINGS: PHARYNX AND LARYNX: The nasopharynx, oropharynx and larynx are normal. Visible portions of the oral cavity, tongue base and floor of mouth are normal. Normal epiglottis, vallecula and pyriform sinuses. The larynx is normal. No retropharyngeal abscess, effusion or lymphadenopathy. 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 The region of the right parotid gland is obscured by streak artifact from dental amalgam but there is no visible soft tissue mass. Left parotid gland is normal. Normal submandibular glands. THYROID: Normal. LYMPH NODES: No enlarged or abnormal density lymph nodes. VASCULAR: Calcific atherosclerosis of the aorta and the carotid bifurcations LIMITED INTRACRANIAL: Normal VISUALIZED ORBITS: Metallic eyelid implant on the right. MASTOIDS AND VISUALIZED PARANASAL SINUSES: No fluid levels or advanced mucosal thickening. No mastoid effusion. SKELETON: No bony spinal canal stenosis. No lytic or blastic lesions. UPPER CHEST: Severe biapical emphysema. OTHER: None. IMPRESSION: 1. Status post right parotid gland resection. The region of the right parotid gland is  obscured by streak artifact from dental amalgam but there is no visible soft tissue mass. 2. No cervical lymphadenopathy. Aortic Atherosclerosis (ICD10-I70.0) and Emphysema (ICD10-J43.9). Electronically Signed   By: Ulyses Jarred M.D.   On: 12/05/2020 19:20   CT Angio Chest PE W and/or Wo Contrast  Result Date: 11/28/2020 CLINICAL DATA:  Dyspnea EXAM: CT ANGIOGRAPHY CHEST WITH CONTRAST TECHNIQUE: Multidetector CT imaging of the chest was performed using the standard protocol during bolus administration of intravenous contrast. Multiplanar CT image reconstructions and MIPs were obtained to evaluate the vascular anatomy. CONTRAST:  167mL OMNIPAQUE IOHEXOL 350 MG/ML SOLN COMPARISON:  09/26/2020 FINDINGS: Cardiovascular: There is adequate opacification of the pulmonary arterial tree. There is no intraluminal filling defect identified to suggest acute pulmonary embolism. The central pulmonary arteries are of normal caliber. Mild atherosclerotic calcification within the thoracic aorta. The thoracic aorta is dilated, measuring 4.2  x 3.9 cm in its ascending segment (coronal # 55/sagittal # 85) and 3.8 x 3.4 cm just beyond the takeoff of the left subclavian vein within the aortic arch (axial # 44/sagittal # 96). At the level of the diaphragmatic hiatus, the descending thoracic aorta measures 3.2 cm. Extensive multi-vessel coronary artery calcification. Mild global cardiomegaly with particular enlargement of the a right ventricle. No pericardial effusion. Mediastinum/Nodes: There has developed extensive bilateral hilar and mediastinal adenopathy, possibly reactive in nature given its relatively rapid development since prior examination. Index right hilar lymph node measures 2.3 cm at axial image # 70 and left hilar lymph node measures 1.5 cm at axial image # 74. The visualized thyroid is unremarkable. The esophagus is unremarkable. Lungs/Pleura: Severe centrilobular and paraseptal emphysema, more severe within the apices  bilaterally. Since the prior examination, there has developed extensive bibasilar ground-glass pulmonary infiltrate and pulmonary consolidation, more severe within the right lower lobe. Small right pleural effusion has developed. Together, the findings are suspicious for atypical infection, aspiration, or less commonly, drug reaction. Small probable associated right parapneumonic effusion. No pneumothorax. The central airways are widely patent. Upper Abdomen: No acute abnormality. Musculoskeletal: No acute bone abnormality. No suspicious lytic or blastic bone lesion. Review of the MIP images confirms the above findings. IMPRESSION: No pulmonary embolism. Severe centrilobular emphysema. Interval development of extensive bibasilar pulmonary infiltrate and consolidation most in keeping with atypical infection in the acute setting with small associated right parapneumonic effusion. Less commonly, this may be seen with aspiration or drug reaction. Extensive mediastinal adenopathy may be reactive in nature given its relatively rapid development. Follow-up imaging in 3 months, once the patient acute issues have resolved, would be helpful in documenting resolution. Extensive multi-vessel coronary artery calcification. Mild cardiomegaly with particular enlargement of the right ventricle. Dilation of the thoracic aorta with maximal dimension of 4.2 cm within the ascending segment. Recommend annual imaging followup by CTA or MRA. This recommendation follows 2010 ACCF/AHA/AATS/ACR/ASA/SCA/SCAI/SIR/STS/SVM Guidelines for the Diagnosis and Management of Patients with Thoracic Aortic Disease. Circulation. 2010; [id number]. Aortic aneurysm NOS (ICD10-I71.9) Aortic aneurysm NOS (ICD10-I71.9). Aortic Atherosclerosis (ICD10-I70.0) and Emphysema (ICD10-J43.9). Electronically Signed   By: Fidela Salisbury MD   On: 11/28/2020 23:19   DG Chest Port 1 View  Result Date: 11/28/2020 CLINICAL DATA:  Chronic oxygen dependent patient  presenting with a 1 week history of progressively worsening shortness of breath and hypoxemia (oxygen saturation in the 70s and 80s). Current history of UIP/pulmonary fibrosis. EXAM: PORTABLE CHEST 1 VIEW COMPARISON:  Chest x-ray 07/15/2020. High-resolution CT chest 09/26/2020. FINDINGS: Cardiac silhouette upper normal in size for AP portable technique. Interstitial opacities throughout both lungs which are not felt to be significantly changed since the high-resolution CT. More confluent opacity peripherally in the mid RIGHT lung was shown on the CT to represent scarring. IMPRESSION: Chronic interstitial lung disease/pulmonary fibrosis. No convincing evidence of acute cardiopulmonary disease. Electronically Signed   By: Evangeline Dakin M.D.   On: 11/28/2020 21:55   DG Swallowing Func-Speech Pathology  Result Date: 11/30/2020 Objective Swallowing Evaluation: Type of Study: MBS-Modified Barium Swallow Study  Patient Details Name: Geoffrey Wood MRN: [id number] Date of Birth: 1946-04-28 Today's Date: 11/30/2020 Time: SLP Start Time (ACUTE ONLY): 1045 -SLP Stop Time (ACUTE ONLY): 1115 SLP Time Calculation (min) (ACUTE ONLY): 30 min Past Medical History: Past Medical History: Diagnosis Date . Adenomatous colon polyp  . Cataract  . Diabetes mellitus  . Emphysema  . Emphysema of lung ([place])  . Erectile dysfunction  .  Hyperlipidemia  . Hypertension  . Peripheral vascular disease ([place])  . Pneumonia  . Pulmonary nodule  Past Surgical History: Past Surgical History: Procedure Laterality Date . ABDOMINAL AORTIC ANEURYSM REPAIR  2004 . COLONOSCOPY  multiple since 2004 HPI: Geoffrey Wood is a 75 y.o. male with a history of COPD, IPF recently started on 4L O2 at home, [facility] of right parotid s/p excision with chronic wound, ITP, HTN, and hard of hearing who presented to the ED late 4/14 for progressive shortness of breath and hypoxia to 70-80%'s despite starting 4L O2 at home. CT Chest shows "Severe centrilobular and paraseptal  emphysema, more  severe within the apices bilaterally. Since the prior examination,  there has developed extensive bibasilar ground-glass pulmonary  infiltrate and pulmonary consolidation, more severe within the right  lower lobe. Small right pleural effusion has developed. 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 On 06-03-20 Dr. Nicolette Bang performed rt parotidectomy with facial nerve dissection which ultimately req'd sacrifice of upper division of rt facial nerve. Rt selective neck dissection also that date.  Pt had three sessions of OP SLP therapy due to facial nerve paresis. Per ENT "He also recieved postoperative radiation therapy but did not f/u with SLP therapy after that.  Since that time he is always had some hearing problems and wears hearing aids but his hearing in his right ear has been worse.  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  No data recorded Assessment / Plan / Recommendation CHL IP CLINICAL IMPRESSIONS 11/30/2020 Clinical Impression Pt demonstrates a mild to moderate dysphagia with trace to mild sensed aspiration events that now pt reports are new over the past few days.  Oral deficits include anterior loss during chin tuck, base of tongue weakness, likely on the right leading to mild premature spillage during bolus formation with trace penetration events before the swallow and one instance of significant aspiration with hard cough. Pt also has decreased laryngeal closure during the swallow though epiglottic deflection and laryngal elevation with glottic clousre are quite complete and sustained in many attempts. Suspect decreased seal of arytenoid tissue leading to penetration during the swallow  and eventual trace aspiration as glottic closure is released. Pt typically senses this with a throat clear or cough, though not all aspirate is cleared. Nectar thick liquids and puree are tolerated without aspiration and a cue for an effortful swallow eliminates mild vallecular residue that was otherwise present. Generally, this dysphagia is mild and possibly temporary if pt can improve conditioning and general strength and pulmonary hygiene. Recommend puree (pts baseline as he does not masticate solids since surgery)and nectar thick liquids during acute phase of rehabilitation. Will benefit from RMST, training for preventative cough strategy and strengthning for increased laryngeal closure as well as preventative oral hygiene measures. SLP Visit Diagnosis Dysphagia, oropharyngeal phase (R13.12) Attention and concentration deficit following -- Frontal lobe and executive function deficit following -- Impact on safety and function Moderate aspiration risk;Risk for inadequate nutrition/hydration   CHL IP TREATMENT RECOMMENDATION 11/30/2020 Treatment Recommendations Therapy as outlined in treatment plan below   Prognosis 11/30/2020 Prognosis for Safe Diet Advancement Good Barriers to Reach Goals -- Barriers/Prognosis Comment -- CHL IP DIET RECOMMENDATION 11/30/2020 SLP Diet Recommendations Dysphagia 1 (  Puree) solids;Nectar thick liquid Liquid Administration via Straw Medication Administration Whole meds with liquid Compensations Slow rate;Small sips/bites;Effortful swallow;Clear throat after each swallow Postural Changes --   No flowsheet data found.  CHL IP FOLLOW UP RECOMMENDATIONS 11/30/2020 Follow up Recommendations Inpatient Rehab   CHL IP FREQUENCY AND DURATION 11/30/2020 Speech Therapy Frequency (ACUTE ONLY) min 2x/week Treatment Duration 2 weeks      CHL IP ORAL PHASE 11/30/2020 Oral Phase Impaired Oral - Pudding Teaspoon -- Oral - Pudding Cup -- Oral - Honey Teaspoon -- Oral - Honey Cup -- Oral - Nectar Teaspoon --  Oral - Nectar Cup -- Oral - Nectar Straw Premature spillage Oral - Thin Teaspoon -- Oral - Thin Cup -- Oral - Thin Straw Premature spillage;Right anterior bolus loss Oral - Puree WFL Oral - Mech Soft -- Oral - Regular -- Oral - Multi-Consistency -- Oral - Pill -- Oral Phase - Comment --  CHL IP PHARYNGEAL PHASE 11/30/2020 Pharyngeal Phase Impaired Pharyngeal- Pudding Teaspoon -- Pharyngeal -- Pharyngeal- Pudding Cup -- Pharyngeal -- Pharyngeal- Honey Teaspoon -- Pharyngeal -- Pharyngeal- Honey Cup -- Pharyngeal -- Pharyngeal- Nectar Teaspoon -- Pharyngeal -- Pharyngeal- Nectar Cup -- Pharyngeal -- Pharyngeal- Nectar Straw Reduced epiglottic inversion;Reduced tongue base retraction;Pharyngeal residue - valleculae Pharyngeal -- Pharyngeal- Thin Teaspoon -- Pharyngeal -- Pharyngeal- Thin Cup -- Pharyngeal -- Pharyngeal- Thin Straw Trace aspiration;Penetration/Aspiration before swallow;Penetration/Aspiration during swallow;Reduced airway/laryngeal closure;Reduced epiglottic inversion Pharyngeal -- Pharyngeal- Puree Pharyngeal residue - valleculae Pharyngeal -- Pharyngeal- Mechanical Soft -- Pharyngeal -- Pharyngeal- Regular -- Pharyngeal -- Pharyngeal- Multi-consistency -- Pharyngeal -- Pharyngeal- Pill -- Pharyngeal -- Pharyngeal Comment --  No flowsheet data found. DeBlois, Katherene Ponto 11/30/2020, 11:55 AM              ECHOCARDIOGRAM COMPLETE  Result Date: 11/29/2020    ECHOCARDIOGRAM REPORT   Patient Name:   Geoffrey Wood Date of Exam: 11/29/2020 Medical Rec #:  [id number]       Height:       72.0 in Accession #:    [id number]      Weight:       143.8 lb Date of Birth:  09/29/1945        BSA:          1.852 m Patient Age:    75 years        BP:           94/54 mmHg Patient Gender: M               HR:           65 bpm. Exam Location:  Inpatient Procedure: 2D Echo, Cardiac Doppler and Color Doppler Indications:    COPD  History:        Patient has prior history of Echocardiogram examinations, most                  recent 08/26/2017. Risk Factors:Dyslipidemia, Hypertension and                 Diabetes.  Sonographer:    Luisa Hart RDCS Referring Phys: [place]  1. Left ventricular ejection fraction, by estimation, is 65 to 70%. The left ventricle has normal function. The left ventricle has no regional wall motion abnormalities. Left ventricular diastolic parameters were normal.  2. Right ventricular systolic function is low normal. The right ventricular size is moderately enlarged. There is mildly elevated pulmonary artery systolic pressure.  3. Left atrial size was mild to moderately dilated.  4. Right atrial size was mild to moderately dilated.  5. The mitral valve is grossly normal. Trivial mitral valve regurgitation.  6. The aortic valve is tricuspid. There is mild calcification of the aortic valve. There is mild thickening of the aortic valve. Aortic valve regurgitation is not visualized. Mild aortic valve sclerosis is present, with no evidence of aortic valve stenosis.  7. The inferior vena cava is dilated in size with <50% respiratory variability, suggesting right atrial pressure of 15 mmHg. Comparison(s): Prior images unable to be directly viewed, comparison made by report only. Conclusion(s)/Recommendation(s): RV moderately enlarged, with mildly elevated RVSP. Normal LVEF. FINDINGS  Left Ventricle: Left ventricular ejection fraction, by estimation, is 65 to 70%. The left ventricle has normal function. The left ventricle has no regional wall motion abnormalities. The left ventricular internal cavity size was normal in size. There is  borderline left ventricular hypertrophy. Left ventricular diastolic parameters were normal. Right Ventricle: The right ventricular size is moderately enlarged. Right vetricular wall thickness was not well visualized. Right ventricular systolic function is low normal. There is mildly elevated pulmonary artery systolic pressure. The tricuspid regurgitant velocity  is 2.57 m/s, and with an assumed right atrial pressure of 15 mmHg, the estimated right ventricular systolic pressure is 123456 mmHg. Left Atrium: Left atrial size was mild to moderately dilated. Right Atrium: Right atrial size was mild to moderately dilated. Pericardium: There is no evidence of pericardial effusion. Mitral Valve: The mitral valve is grossly normal. Trivial mitral valve regurgitation. Tricuspid Valve: The tricuspid valve is normal in structure. Tricuspid valve regurgitation is mild. Aortic Valve: The aortic valve is tricuspid. There is mild calcification of the aortic valve. There is mild thickening of the aortic valve. Aortic valve regurgitation is not visualized. Mild aortic valve sclerosis is present, with no evidence of aortic valve stenosis. Aortic valve mean gradient measures 6.0 mmHg. Aortic valve peak gradient measures 11.8 mmHg. Aortic valve area, by VTI measures 2.51 cm. Pulmonic Valve: The pulmonic valve was grossly normal. Pulmonic valve regurgitation is not visualized. No evidence of pulmonic stenosis. Aorta: The aortic root and ascending aorta are structurally normal, with no evidence of dilitation. Venous: The inferior vena cava is dilated in size with less than 50% respiratory variability, suggesting right atrial pressure of 15 mmHg. IAS/Shunts: The atrial septum is grossly normal.  LEFT VENTRICLE PLAX 2D LVIDd:         4.66 cm     Diastology LVIDs:         2.88 cm     LV e' medial:    7.72 cm/s LV PW:         1.20 cm     LV E/e' medial:  6.0 LV IVS:        0.70 cm     LV e' lateral:   10.40 cm/s LVOT diam:     2.60 cm     LV E/e' lateral: 4.5 LV SV:         100 LV SV Index:   54 LVOT Area:     5.31 cm  LV Volumes (MOD) LV vol d, MOD A2C: 85.5 ml LV vol s, MOD A2C: 28.9 ml LV SV MOD A2C:     56.6 ml RIGHT VENTRICLE RV S prime:     10.10 cm/s  PULMONARY VEINS TAPSE (M-mode): 1.6 cm      A Reversal Duration: 77.00 msec  A Reversal Velocity: 16.80 cm/s                              Diastolic Velocity:  99991111 cm/s                             S/D Velocity:        1.40                             Systolic Velocity:   Q000111Q cm/s AORTIC VALVE                    PULMONIC VALVE AV Area (Vmax):    2.58 cm     PV Vmax:       0.94 m/s AV Area (Vmean):   2.81 cm     PV Vmean:      79.900 cm/s AV Area (VTI):     2.51 cm     PV VTI:        0.263 m AV Vmax:           172.00 cm/s  PV Peak grad:  3.5 mmHg AV Vmean:          113.000 cm/s PV Mean grad:  3.0 mmHg AV VTI:            0.400 m AV Peak Grad:      11.8 mmHg AV Mean Grad:      6.0 mmHg LVOT Vmax:         83.50 cm/s LVOT Vmean:        59.900 cm/s LVOT VTI:          0.189 m LVOT/AV VTI ratio: 0.47  AORTA Ao Root diam: 4.00 cm Ao Asc diam:  3.70 cm MITRAL VALVE               TRICUSPID VALVE MV Area (PHT): 2.50 cm    TR Peak grad:   26.4 mmHg MV Decel Time: 303 msec    TR Vmax:        257.00 cm/s MV E velocity: 46.70 cm/s MV A velocity: 83.90 cm/s  SHUNTS MV E/A ratio:  0.56        Systemic VTI:  0.19 m                            Systemic Diam: 2.60 cm Buford Dresser MD Electronically signed by Buford Dresser MD Signature Date/Time: 11/29/2020/6:11:02 PM    Final     Impression/Plan:    1) Head and Neck Cancer Status:  NED.  I reviewed his neck CT scan today which shows no evidence of disease; we are [facility] images to [facility] otolaryngology  Chest CT in February showed no evidence of cancer but did show "mild pulmonary fibrosis" and emphysema that is "severe"  I have personally reviewed his images  2) Nutritional Status: He continues to take 3000 to 4000 cal by mouth per day.  His weight was 156 pounds in December and 148 pounds on February 4. See other weights below.  I do not think a feeding tube will help him since he has good intake.  I asked Dory Peru to reach out to the patient and his wife by phone to talk about his hyperglycemia and nutrition.  Wt Readings from  Last 3 Encounters:  11/26/20  143 lb 12 oz (65.2 kg)  11/12/20 143 lb 9.6 oz (65.1 kg)  10/08/20 138 lb (62.6 kg)    3)  Swallowing: Functional, continue swallowing exercises  4) Dental: Encouraged to continue regular followup with dentistry, and dental hygiene including fluoride rinses.   5) Thyroid function: Unlikely to be affected by radiation therapy but reasonable to check annually Lab Results  Component Value Date   TSH 1.148 07/09/2020    6)  F/u in 34mo. The patient's wife is going to seek second opinions in hematology and pulmonology.  I encouraged her to continue to follow-up with her local providers as well.  Our navigator is going to try to move up his appointment with hematology per the patient's request as they would like to be aggressive with working up his bone marrow.  This encounter was provided by telemedicine platform; patient desired telemedicine during pandemic precautions.  MyChart video was used. The patient has given verbal consent for this type of encounter and has been advised to only accept a meeting of this type in a secure network environment. On date of service, in total, I spent 45 minutes on this encounter.   The attendants for this meeting include Eppie Gibson  and [name] During the encounter, Eppie Gibson was located at [facility].  [name] was located at home.   _____________________________________   Eppie Gibson, MD

## 2020-12-06 NOTE — Progress Notes (Incomplete Revision)
Radiation Oncology         [phone] ________________________________  Name: Geoffrey Wood MRN: [id number]  Date: 12/06/2020  DOB: [birth year]  Follow-Up Visit Note  CC: Crist Infante, MD  Francina Ames, MD  Diagnosis and Prior Radiotherapy:     No diagnosis found.  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  BM's are unremarkable.    There were no vitals filed for this visit.                     ALLERGIES:  has No Known Allergies.  Meds: Current Outpatient Medications  Medication Sig Dispense Refill  . acetaminophen (TYLENOL) 325 MG tablet Take 325 mg by mouth every 6 (six) hours as needed for mild pain.    Marland Kitchen albuterol (PROVENTIL) (2.5 MG/3ML) 0.083% nebulizer solution Take 3 mLs (2.5 mg total) by nebulization every 4 (four) hours as needed for shortness of breath. 75 mL 12  . ANORO ELLIPTA 62.5-25 MCG/INH AEPB Take 1 puff by mouth daily.    . Ascorbic Acid (VITAMIN C) 500 MG CAPS Take 1 capsule by mouth 2 (two) times daily.    Marland Kitchen b complex vitamins capsule Take 1 capsule by mouth daily.    . Cholecalciferol (VITAMIN D PO) Take 800 Units by mouth daily.    . Cyanocobalamin (VITAMIN B 12 PO) Take 500 mcg by mouth daily.    . Maltodextrin-Xanthan Gum (RESOURCE THICKENUP CLEAR) POWD Take 1 Container by mouth as needed. 1250 g 0  . mirtazapine (REMERON) 15 MG tablet Take 45 mg by mouth at bedtime.    . Multiple Vitamin (MULTIVITAMIN WITH MINERALS) TABS tablet Take 1 tablet by mouth daily. 30 tablet 0  . Nutritional Supplements (FEEDING SUPPLEMENT, KATE FARMS STANDARD 1.4,) LIQD liquid Take 325 mLs by mouth 3 (three) times daily between meals. 3250 mL 0  . ONETOUCH VERIO test strip     . pantoprazole (PROTONIX) 40 MG tablet Take 1 tablet (40 mg total) by mouth daily. 30 tablet 0  . predniSONE (DELTASONE) 20 MG tablet Take 2 tablets (40 mg total) by mouth daily with breakfast. 30  tablet 0  . protein supplement (RESOURCE BENEPROTEIN) 6 g POWD Take 1 Scoop (6 g total) by mouth 3 (three) times daily with meals. 100 packet 0   No current facility-administered medications for this encounter.   Facility-Administered Medications Ordered in Other Encounters  Medication Dose Route Frequency Provider Last Rate Last Admin  . 0.9 %  sodium chloride infusion   Intravenous Continuous Magrinat, Virgie Dad, MD 500 mL/hr at 11/26/20 1712 New Bag at 11/26/20 1712  . acetaminophen (TYLENOL) tablet 650 mg  650 mg Oral Once Brunetta Genera, MD      . albuterol (PROVENTIL) (2.5 MG/3ML) 0.083% nebulizer solution 2.5 mg  2.5 mg Nebulization Q4H PRN Brunetta Genera, MD      . loratadine (CLARITIN) tablet 10 mg  10 mg Oral Once Brunetta Genera, MD        Physical Findings: The patient is in no acute distress. Patient is alert and oriented. Wt Readings from Last 3 Encounters:  11/26/20 143 lb 12 oz (65.2 kg)  11/12/20 143 lb 9.6 oz (65.1 kg)  10/08/20 138 lb (62.6 kg)    vitals were not taken for this visit. .  General: Alert and oriented, in no acute distress HEENT: Oral cavity and  oropharynx are clear.  No thrush.  Stable right facial droop.  Surgical wound in right preauricular region is healing well with continued moist ooze, though improved from last week of radiation; he is no longer bleeding around his right ear Neck: Neck is notable for excellent healing of skin Skin: Skin in treatment fields shows satisfactory healing Psychiatric: Judgment and insight are intact. Affect is appropriate.   Lab Findings: Lab Results  Component Value Date   WBC 3.3 (L) 12/04/2020   HGB 9.5 (L) 12/04/2020   HCT 30.6 (L) 12/04/2020   MCV 82.7 12/04/2020   PLT 62 (L) 12/04/2020    Lab Results  Component Value Date   TSH 1.148 07/09/2020    Radiographic Findings: CT Soft Tissue Neck W Contrast  Result Date: 12/05/2020 CLINICAL DATA:  Carcinoma of the right parotid gland EXAM:  CT NECK WITH CONTRAST TECHNIQUE: Multidetector CT imaging of the neck was performed using the standard protocol following the bolus administration of intravenous contrast. CONTRAST:  40m OMNIPAQUE IOHEXOL 300 MG/ML  SOLN COMPARISON:  None. FINDINGS: PHARYNX AND LARYNX: The nasopharynx, oropharynx and larynx are normal. Visible portions of the oral cavity, tongue base and floor of mouth are normal. Normal epiglottis, vallecula and pyriform sinuses. The larynx is normal. No retropharyngeal abscess, effusion or lymphadenopathy. 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 The region of the right parotid gland is obscured by streak artifact from dental amalgam but there is no visible soft tissue mass. Left parotid gland is normal. Normal submandibular glands. THYROID: Normal. LYMPH NODES: No enlarged or abnormal density lymph nodes. VASCULAR: Calcific atherosclerosis of the aorta and the carotid bifurcations LIMITED INTRACRANIAL: Normal VISUALIZED ORBITS: Metallic eyelid implant on the right. MASTOIDS AND VISUALIZED PARANASAL SINUSES: No fluid levels or advanced mucosal thickening. No mastoid effusion. SKELETON: No bony spinal canal stenosis. No lytic or blastic lesions. UPPER CHEST: Severe biapical emphysema. OTHER: None. IMPRESSION: 1. Status post right parotid gland resection. The region of the right parotid gland is obscured by streak artifact from dental amalgam but there is no visible soft tissue mass. 2. No cervical lymphadenopathy. Aortic Atherosclerosis (ICD10-I70.0) and Emphysema (ICD10-J43.9). Electronically Signed   By: KUlyses JarredM.D.   On: 12/05/2020 19:20   CT Angio Chest PE W and/or Wo Contrast  Result Date: 11/28/2020 CLINICAL DATA:  Dyspnea EXAM: CT ANGIOGRAPHY CHEST WITH CONTRAST TECHNIQUE: Multidetector CT imaging of the chest was performed using the standard protocol during bolus administration of intravenous contrast. Multiplanar CT image reconstructions and MIPs were obtained to  evaluate the vascular anatomy. CONTRAST:  1031mOMNIPAQUE IOHEXOL 350 MG/ML SOLN COMPARISON:  09/26/2020 FINDINGS: Cardiovascular: There is adequate opacification of the pulmonary arterial tree. There is no intraluminal filling defect identified to suggest acute pulmonary embolism. The central pulmonary arteries are of normal caliber. Mild atherosclerotic calcification within the thoracic aorta. The thoracic aorta is dilated, measuring 4.2 x 3.9 cm in its ascending segment (coronal # 55/sagittal # 85) and 3.8 x 3.4 cm just beyond the takeoff of the left subclavian vein within the aortic arch (axial # 44/sagittal # 96). At the level of the diaphragmatic hiatus, the descending thoracic aorta measures 3.2 cm. Extensive multi-vessel coronary artery calcification. Mild global cardiomegaly with particular enlargement of the a right ventricle. No pericardial effusion. Mediastinum/Nodes: There has developed extensive bilateral hilar and mediastinal adenopathy, possibly reactive in nature given its relatively rapid development since prior examination. Index right hilar lymph node measures 2.3 cm at axial image # 70 and left hilar  lymph node measures 1.5 cm at axial image # 74. The visualized thyroid is unremarkable. The esophagus is unremarkable. Lungs/Pleura: Severe centrilobular and paraseptal emphysema, more severe within the apices bilaterally. Since the prior examination, there has developed extensive bibasilar ground-glass pulmonary infiltrate and pulmonary consolidation, more severe within the right lower lobe. Small right pleural effusion has developed. Together, the findings are suspicious for atypical infection, aspiration, or less commonly, drug reaction. Small probable associated right parapneumonic effusion. No pneumothorax. The central airways are widely patent. Upper Abdomen: No acute abnormality. Musculoskeletal: No acute bone abnormality. No suspicious lytic or blastic bone lesion. Review of the MIP images  confirms the above findings. IMPRESSION: No pulmonary embolism. Severe centrilobular emphysema. Interval development of extensive bibasilar pulmonary infiltrate and consolidation most in keeping with atypical infection in the acute setting with small associated right parapneumonic effusion. Less commonly, this may be seen with aspiration or drug reaction. Extensive mediastinal adenopathy may be reactive in nature given its relatively rapid development. Follow-up imaging in 3 months, once the patient acute issues have resolved, would be helpful in documenting resolution. Extensive multi-vessel coronary artery calcification. Mild cardiomegaly with particular enlargement of the right ventricle. Dilation of the thoracic aorta with maximal dimension of 4.2 cm within the ascending segment. Recommend annual imaging followup by CTA or MRA. This recommendation follows 2010 ACCF/AHA/AATS/ACR/ASA/SCA/SCAI/SIR/STS/SVM Guidelines for the Diagnosis and Management of Patients with Thoracic Aortic Disease. Circulation. 2010; 121: L976-B341. Aortic aneurysm NOS (ICD10-I71.9) Aortic aneurysm NOS (ICD10-I71.9). Aortic Atherosclerosis (ICD10-I70.0) and Emphysema (ICD10-J43.9). Electronically Signed   By: Fidela Salisbury MD   On: 11/28/2020 23:19   DG Chest Port 1 View  Result Date: 11/28/2020 CLINICAL DATA:  Chronic oxygen dependent patient presenting with a 1 week history of progressively worsening shortness of breath and hypoxemia (oxygen saturation in the 70s and 80s). Current history of UIP/pulmonary fibrosis. EXAM: PORTABLE CHEST 1 VIEW COMPARISON:  Chest x-ray 07/15/2020. High-resolution CT chest 09/26/2020. FINDINGS: Cardiac silhouette upper normal in size for AP portable technique. Interstitial opacities throughout both lungs which are not felt to be significantly changed since the high-resolution CT. More confluent opacity peripherally in the mid RIGHT lung was shown on the CT to represent scarring. IMPRESSION: Chronic  interstitial lung disease/pulmonary fibrosis. No convincing evidence of acute cardiopulmonary disease. Electronically Signed   By: Evangeline Dakin M.D.   On: 11/28/2020 21:55   DG Swallowing Func-Speech Pathology  Result Date: 11/30/2020 Objective Swallowing Evaluation: Type of Study: MBS-Modified Barium Swallow Study  Patient Details Name: Geoffrey Wood MRN: [id number] Date of Birth: 1945/12/13 Today's Date: 11/30/2020 Time: SLP Start Time (ACUTE ONLY): 1045 -SLP Stop Time (ACUTE ONLY): 1115 SLP Time Calculation (min) (ACUTE ONLY): 30 min Past Medical History: Past Medical History: Diagnosis Date . Adenomatous colon polyp  . Cataract  . Diabetes mellitus  . Emphysema  . Emphysema of lung ([place])  . Erectile dysfunction  . Hyperlipidemia  . Hypertension  . Peripheral vascular disease ([place])  . Pneumonia  . Pulmonary nodule  Past Surgical History: Past Surgical History: Procedure Laterality Date . ABDOMINAL AORTIC ANEURYSM REPAIR  2004 . COLONOSCOPY  multiple since 2004 HPI: Geoffrey Wood is a 75 y.o. 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 CT Chest shows "Severe centrilobular  and paraseptal emphysema, more  severe within the apices bilaterally. Since the prior examination,  there has developed extensive bibasilar ground-glass pulmonary  infiltrate and pulmonary consolidation, more severe within the right  lower lobe. Small right pleural effusion has developed. 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 On 06-03-20 Dr. Nicolette Bang performed rt  parotidectomy with facial nerve dissection which ultimately req'd sacrifice of upper division of rt facial nerve. Rt selective neck dissection also that date.  Pt had three sessions of OP SLP therapy due to facial nerve paresis. Per ENT "He also recieved postoperative radiation therapy but did not f/u with SLP therapy after that.  Since that time he is always had some hearing problems and wears hearing aids but his hearing in his right ear has been worse.  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  No data recorded Assessment / Plan / Recommendation CHL IP CLINICAL IMPRESSIONS 11/30/2020 Clinical Impression Pt demonstrates a mild to moderate dysphagia with trace to mild sensed aspiration events that now pt reports are new over the past few days.  Oral deficits include anterior loss during chin tuck, base of tongue weakness, likely on the right leading to mild premature spillage during bolus formation with trace penetration events before the swallow and one instance of significant aspiration with hard cough. Pt also has decreased laryngeal closure during the swallow though epiglottic deflection and laryngal elevation with glottic clousre are quite complete and sustained in many attempts. Suspect decreased seal of arytenoid tissue leading to penetration during the swallow and eventual trace aspiration as glottic closure is released. Pt typically senses this with a throat clear or cough, though not all aspirate is cleared. Nectar thick liquids and puree are tolerated without aspiration and a cue for an effortful swallow eliminates mild vallecular residue that was otherwise present. Generally, this dysphagia is mild and possibly temporary if pt can improve conditioning and general strength and pulmonary hygiene. Recommend puree (pts baseline as he does not masticate solids since surgery)and nectar thick liquids during acute phase of rehabilitation. Will benefit  from RMST, training for preventative cough strategy and strengthning for increased laryngeal closure as well as preventative oral hygiene measures. SLP Visit Diagnosis Dysphagia, oropharyngeal phase (R13.12) Attention and concentration deficit following -- Frontal lobe and executive function deficit following -- Impact on safety and function Moderate aspiration risk;Risk for inadequate nutrition/hydration   CHL IP TREATMENT RECOMMENDATION 11/30/2020 Treatment Recommendations Therapy as outlined in treatment plan below   Prognosis 11/30/2020 Prognosis for Safe Diet Advancement Good Barriers to Reach Goals -- Barriers/Prognosis Comment -- CHL IP DIET RECOMMENDATION 11/30/2020 SLP Diet Recommendations Dysphagia 1 (Puree) solids;Nectar thick liquid Liquid Administration via Straw Medication Administration Whole meds with liquid Compensations Slow rate;Small sips/bites;Effortful swallow;Clear throat after each swallow Postural Changes --   No flowsheet data found.  CHL IP FOLLOW UP RECOMMENDATIONS 11/30/2020 Follow up Recommendations Inpatient Rehab   CHL IP FREQUENCY AND DURATION 11/30/2020 Speech Therapy Frequency (ACUTE ONLY) min 2x/week Treatment Duration 2 weeks      CHL IP ORAL PHASE 11/30/2020 Oral Phase Impaired Oral - Pudding Teaspoon -- Oral - Pudding Cup -- Oral - Honey Teaspoon -- Oral - Honey Cup -- Oral - Nectar Teaspoon -- Oral - Nectar  Cup -- Oral - Nectar Straw Premature spillage Oral - Thin Teaspoon -- Oral - Thin Cup -- Oral - Thin Straw Premature spillage;Right anterior bolus loss Oral - Puree WFL Oral - Mech Soft -- Oral - Regular -- Oral - Multi-Consistency -- Oral - Pill -- Oral Phase - Comment --  CHL IP PHARYNGEAL PHASE 11/30/2020 Pharyngeal Phase Impaired Pharyngeal- Pudding Teaspoon -- Pharyngeal -- Pharyngeal- Pudding Cup -- Pharyngeal -- Pharyngeal- Honey Teaspoon -- Pharyngeal -- Pharyngeal- Honey Cup -- Pharyngeal -- Pharyngeal- Nectar Teaspoon -- Pharyngeal -- Pharyngeal- Nectar Cup -- Pharyngeal  -- Pharyngeal- Nectar Straw Reduced epiglottic inversion;Reduced tongue base retraction;Pharyngeal residue - valleculae Pharyngeal -- Pharyngeal- Thin Teaspoon -- Pharyngeal -- Pharyngeal- Thin Cup -- Pharyngeal -- Pharyngeal- Thin Straw Trace aspiration;Penetration/Aspiration before swallow;Penetration/Aspiration during swallow;Reduced airway/laryngeal closure;Reduced epiglottic inversion Pharyngeal -- Pharyngeal- Puree Pharyngeal residue - valleculae Pharyngeal -- Pharyngeal- Mechanical Soft -- Pharyngeal -- Pharyngeal- Regular -- Pharyngeal -- Pharyngeal- Multi-consistency -- Pharyngeal -- Pharyngeal- Pill -- Pharyngeal -- Pharyngeal Comment --  No flowsheet data found. DeBlo[place], Katherene Ponto 11/30/2020, 11:55 AM              ECHOCARDIOGRAM COMPLETE  Result Date: 11/29/2020    ECHOCARDIOGRAM REPORT   Patient Name:   Geoffrey Wood Date of Exam: 11/29/2020 Medical Rec #:  [id number]       Height:       72.0 in Accession #:    [id number]      Weight:       143.8 lb Date of Birth:  05-02-1946        BSA:          1.852 m Patient Age:    56 years        BP:           94/54 mmHg Patient Gender: M               HR:           65 bpm. Exam Location:  Inpatient Procedure: 2D Echo, Cardiac Doppler and Color Doppler Indications:    COPD  History:        Patient has prior history of Echocardiogram examinations, most                 recent 08/26/2017. Risk Factors:Dyslipidemia, Hypertension and                 Diabetes.  Sonographer:    Luisa Hart RDCS Referring Phys: [place]  1. Left ventricular ejection fraction, by estimation, is 65 to 70%. The left ventricle has normal function. The left ventricle has no regional wall motion abnormalities. Left ventricular diastolic parameters were normal.  2. Right ventricular systolic function is low normal. The right ventricular size is moderately enlarged. There is mildly elevated pulmonary artery systolic pressure.  3. Left atrial size was mild to  moderately dilated.  4. Right atrial size was mild to moderately dilated.  5. The mitral valve is grossly normal. Trivial mitral valve regurgitation.  6. The aortic valve is tricuspid. There is mild calcification of the aortic valve. There is mild thickening of the aortic valve. Aortic valve regurgitation is not visualized. Mild aortic valve sclerosis is present, with no evidence of aortic valve stenosis.  7. The inferior vena cava is dilated in size with <50% respiratory variability, suggesting right atrial pressure of 15 mmHg. Comparison(s): Prior images unable to be directly viewed, comparison made by report only. Conclusion(s)/Recommendation(s): RV moderately enlarged, with mildly  elevated RVSP. Normal LVEF. FINDINGS  Left Ventricle: Left ventricular ejection fraction, by estimation, is 65 to 70%. The left ventricle has normal function. The left ventricle has no regional wall motion abnormalities. The left ventricular internal cavity size was normal in size. There is  borderline left ventricular hypertrophy. Left ventricular diastolic parameters were normal. Right Ventricle: The right ventricular size is moderately enlarged. Right vetricular wall thickness was not well visualized. Right ventricular systolic function is low normal. There is mildly elevated pulmonary artery systolic pressure. The tricuspid regurgitant velocity is 2.57 m/s, and with an assumed right atrial pressure of 15 mmHg, the estimated right ventricular systolic pressure is 03.4 mmHg. Left Atrium: Left atrial size was mild to moderately dilated. Right Atrium: Right atrial size was mild to moderately dilated. Pericardium: There is no evidence of pericardial effusion. Mitral Valve: The mitral valve is grossly normal. Trivial mitral valve regurgitation. Tricuspid Valve: The tricuspid valve is normal in structure. Tricuspid valve regurgitation is mild. Aortic Valve: The aortic valve is tricuspid. There is mild calcification of the aortic valve.  There is mild thickening of the aortic valve. Aortic valve regurgitation is not visualized. Mild aortic valve sclerosis is present, with no evidence of aortic valve stenosis. Aortic valve mean gradient measures 6.0 mmHg. Aortic valve peak gradient measures 11.8 mmHg. Aortic valve area, by VTI measures 2.51 cm. Pulmonic Valve: The pulmonic valve was grossly normal. Pulmonic valve regurgitation is not visualized. No evidence of pulmonic stenosis. Aorta: The aortic root and ascending aorta are structurally normal, with no evidence of dilitation. Venous: The inferior vena cava is dilated in size with less than 50% respiratory variability, suggesting right atrial pressure of 15 mmHg. IAS/Shunts: The atrial septum is grossly normal.  LEFT VENTRICLE PLAX 2D LVIDd:         4.66 cm     Diastology LVIDs:         2.88 cm     LV e' medial:    7.72 cm/s LV PW:         1.20 cm     LV E/e' medial:  6.0 LV IVS:        0.70 cm     LV e' lateral:   10.40 cm/s LVOT diam:     2.60 cm     LV E/e' lateral: 4.5 LV SV:         100 LV SV Index:   54 LVOT Area:     5.31 cm  LV Volumes (MOD) LV vol d, MOD A2C: 85.5 ml LV vol s, MOD A2C: 28.9 ml LV SV MOD A2C:     56.6 ml RIGHT VENTRICLE RV S prime:     10.10 cm/s  PULMONARY VEINS TAPSE (M-mode): 1.6 cm      A Reversal Duration: 77.00 msec                             A Reversal Velocity: 16.80 cm/s                             Diastolic Velocity:  91.79 cm/s                             S/D Velocity:        1.40  Systolic Velocity:   19.50 cm/s AORTIC VALVE                    PULMONIC VALVE AV Area (Vmax):    2.58 cm     PV Vmax:       0.94 m/s AV Area (Vmean):   2.81 cm     PV Vmean:      79.900 cm/s AV Area (VTI):     2.51 cm     PV VTI:        0.263 m AV Vmax:           172.00 cm/s  PV Peak grad:  3.5 mmHg AV Vmean:          113.000 cm/s PV Mean grad:  3.0 mmHg AV VTI:            0.400 m AV Peak Grad:      11.8 mmHg AV Mean Grad:      6.0 mmHg LVOT Vmax:          83.50 cm/s LVOT Vmean:        59.900 cm/s LVOT VTI:          0.189 m LVOT/AV VTI ratio: 0.47  AORTA Ao Root diam: 4.00 cm Ao Asc diam:  3.70 cm MITRAL VALVE               TRICUSPID VALVE MV Area (PHT): 2.50 cm    TR Peak grad:   26.4 mmHg MV Decel Time: 303 msec    TR Vmax:        257.00 cm/s MV E velocity: 46.70 cm/s MV A velocity: 83.90 cm/s  SHUNTS MV E/A ratio:  0.56        Systemic VTI:  0.19 m                            Systemic Diam: 2.60 cm Buford Dresser MD Electronically signed by Buford Dresser MD Signature Date/Time: 11/29/2020/6:11:02 PM    Final     Impression/Plan:    1) 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  2) Nutritional Status: Stable on oral diet.  I applauded the patient and his wife for pushing his nutrition.  They are doing an outstanding job at this.  His taste is starting to come back.  They understand that his appetite will take a while to recuperate. PEG tube: None; I do not recommend one at this time.  3)  Swallowing: Functional, continue swallowing exercises  4) Dental: Encouraged to continue regular followup with dentistry, and dental hygiene including fluoride rinses.  We will refer him back to Dr. Benson Norway  5) Thyroid function: Unlikely to be affected by radiation therapy but reasonable to check annually Lab Results  Component Value Date   TSH 1.148 07/09/2020    6) Other: Progressive hearing loss: They will contact his audiologist for another assessment.  He will also ask ENT about cleaning his ear canal if indicated at follow-up.  Dry mouth: Continue to push sips of water, Biotene products; I also recommended he try therabreath lozenges  7) Follow-up in 2 and half months with CT of neck with contrast.  He anticipates a CT of his chest in the next couple weeks with his pulmonologist.   We will refer him back to otolaryngology for a post treatment follow-up.  He and his wife know to contact us if they have any concerns in the  interim.  He will also continue to follow closely with medical oncology for pancytopenia of unknown etiology.  He may need a bone marrow biopsy in the future.  It is possible that some of his fatigue and malaise is related to his pancytopenia.   *** f/u in 92mo On date of service, in total, I spent 30 minutes on this encounter. Patient was seen in person. _____________________________________   SEppie Gibson MD

## 2020-12-06 NOTE — Telephone Encounter (Signed)
CALLED  PATIENT TO INFORM OF FU APPT. WITH [name] ON 12/12/20 @ 11:40 AM, SPOKE WITH PATIENT'S WIFE- Geoffrey Wood AND SHE IS AWARE OF THIS APPT.

## 2020-12-06 NOTE — Telephone Encounter (Signed)
CALLED PATIENT'S WIFE- Geoffrey Wood TO INFORM THAT DR. SQUIRE WILL BE LATER THAN 11:40 AM CONTACTING HIM TODAY (VIA COMPUTER), SPOKE WITH PATIENT'S WIFE- PATIENT'S WIFE- Geoffrey Wood AND SHE IS AWARE OF THIS

## 2020-12-09 ENCOUNTER — Encounter: Payer: Self-pay | Admitting: Radiation Oncology

## 2020-12-09 ENCOUNTER — Telehealth: Payer: Self-pay | Admitting: Nutrition

## 2020-12-09 ENCOUNTER — Other Ambulatory Visit: Payer: Self-pay

## 2020-12-09 ENCOUNTER — Ambulatory Visit: Payer: Medicare Other | Admitting: Nutrition

## 2020-12-09 ENCOUNTER — Telehealth: Payer: Self-pay | Admitting: Cardiovascular Disease

## 2020-12-09 DIAGNOSIS — J441 Chronic obstructive pulmonary disease with (acute) exacerbation: Secondary | ICD-10-CM

## 2020-12-09 NOTE — Telephone Encounter (Signed)
MD requested follow-up phone call to patient's wife secondary to hyperglycemia on prednisone.  Patient's wife concerned regarding high carbohydrate/sugar intake and oral nutrition supplements.  She verbalizes the importance of adequate calories to minimize weight loss however she is concerned with his high blood sugars. She states MD told her to begin tapering steroids however she has not started this yet.  She has an appointment tomorrow with nurse practitioner who manages patient's blood sugars.  She reports appetite and taste have improved.  Patient says he can now chew without his mouth hurting.  He was able to eat some grits and a half a piece of toast the other day.  He tried ground chicken but said it had no taste.  She has been giving him Ensure max which provides only 150 cal versus Ensure complete which provides 350 cal.  She reports patient has lost a pound and a half.    Educated on strategies for improving hyperglycemia by combining macronutrients.  Stressed importance of increasing overall calories and keeping carbohydrates within appropriate distribution.  Encourage patient to consume foods by mouth regardless of neutral taste.  Hopefully patient will be able to increase high-calorie oral nutrition supplements with appropriate medications for hyperglycemia.  Encouraged wife to contact RD with further questions.  Telephone follow-up on Tuesday, May 3.  **Disclaimer: This note was dictated with voice recognition software. Similar sounding words can inadvertently be transcribed and this note may contain transcription errors which may not have been corrected upon publication of note.**

## 2020-12-09 NOTE — Progress Notes (Signed)
See telephone note.

## 2020-12-09 NOTE — Telephone Encounter (Signed)
[name] called in requesting a appt for her husband.Advised her the next appt is 7/14.She also states that her husband is loosing so much weight he was just discharged from hospital and needs a appt soon as possible.She also request that a telephone note be sent to the nurse to see where he can be worked in at.Please advise

## 2020-12-09 NOTE — Telephone Encounter (Signed)
RN spoke to wife regarding appointment. RN advised that 7/14 was the next opening. Wife advised that they have a follow-up appt on 4/27 with pulmonary and she is trying to make sure he is seen by appropriate providers. Patients hospitalization was due to COPD exacerbation. Wife in agreement to schedule appt and be put on waitlist for sooner appointment. Wife verbalized understanding.

## 2020-12-09 NOTE — Telephone Encounter (Signed)
Unfortunately, I do not have any openings sooner than the date provided.

## 2020-12-10 ENCOUNTER — Telehealth: Payer: Self-pay | Admitting: Hematology

## 2020-12-10 NOTE — Telephone Encounter (Signed)
R/s appts per 4/26 sch msg. Pt's wife is aware.

## 2020-12-11 ENCOUNTER — Other Ambulatory Visit: Payer: Self-pay

## 2020-12-11 ENCOUNTER — Encounter: Payer: Self-pay | Admitting: Adult Health

## 2020-12-11 ENCOUNTER — Ambulatory Visit (INDEPENDENT_AMBULATORY_CARE_PROVIDER_SITE_OTHER): Payer: Medicare Other | Admitting: Adult Health

## 2020-12-11 DIAGNOSIS — J84112 Idiopathic pulmonary fibrosis: Secondary | ICD-10-CM | POA: Diagnosis not present

## 2020-12-11 DIAGNOSIS — D696 Thrombocytopenia, unspecified: Secondary | ICD-10-CM | POA: Insufficient documentation

## 2020-12-11 DIAGNOSIS — R1319 Other dysphagia: Secondary | ICD-10-CM

## 2020-12-11 DIAGNOSIS — F17200 Nicotine dependence, unspecified, uncomplicated: Secondary | ICD-10-CM | POA: Insufficient documentation

## 2020-12-11 DIAGNOSIS — R600 Localized edema: Secondary | ICD-10-CM | POA: Insufficient documentation

## 2020-12-11 DIAGNOSIS — I129 Hypertensive chronic kidney disease with stage 1 through stage 4 chronic kidney disease, or unspecified chronic kidney disease: Secondary | ICD-10-CM | POA: Insufficient documentation

## 2020-12-11 DIAGNOSIS — C44320 Squamous cell carcinoma of skin of unspecified parts of face: Secondary | ICD-10-CM | POA: Insufficient documentation

## 2020-12-11 DIAGNOSIS — R0902 Hypoxemia: Secondary | ICD-10-CM | POA: Insufficient documentation

## 2020-12-11 DIAGNOSIS — R1310 Dysphagia, unspecified: Secondary | ICD-10-CM | POA: Insufficient documentation

## 2020-12-11 DIAGNOSIS — J441 Chronic obstructive pulmonary disease with (acute) exacerbation: Secondary | ICD-10-CM

## 2020-12-11 DIAGNOSIS — E43 Unspecified severe protein-calorie malnutrition: Secondary | ICD-10-CM | POA: Diagnosis not present

## 2020-12-11 DIAGNOSIS — C7989 Secondary malignant neoplasm of other specified sites: Secondary | ICD-10-CM | POA: Insufficient documentation

## 2020-12-11 DIAGNOSIS — J9611 Chronic respiratory failure with hypoxia: Secondary | ICD-10-CM | POA: Insufficient documentation

## 2020-12-11 DIAGNOSIS — J189 Pneumonia, unspecified organism: Secondary | ICD-10-CM

## 2020-12-11 DIAGNOSIS — R0602 Shortness of breath: Secondary | ICD-10-CM | POA: Insufficient documentation

## 2020-12-11 DIAGNOSIS — C07 Malignant neoplasm of parotid gland: Secondary | ICD-10-CM

## 2020-12-11 DIAGNOSIS — I950 Idiopathic hypotension: Secondary | ICD-10-CM | POA: Insufficient documentation

## 2020-12-11 NOTE — Assessment & Plan Note (Signed)
Continue follow-up with oncology 

## 2020-12-11 NOTE — Progress Notes (Signed)
'@Patient'  ID: Geoffrey Wood, male    DOB: 1946/01/05, 75 y.o.   MRN: [id number]  Chief Complaint  Patient presents with  . Hospitalization Follow-up  . 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  No mold, hot tub, Jacuzzi ILD questionnaire 07/15/2020-negative Smoking history: 90-pack-year smoker.  Quit in 2016 Travel history: Originally from [place].  No significant recent travel Relevant family history: Father had lung cancer, mother had COPD.  Both were heavy smokers.  Low-dose screening CT 04/16/2020-moderate emphysema, stable pulmonary nodules, scattered areas of cylindrical bronchiectasis with reticulation and possible honeycombing in the mid to lower lungs. 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 I have reviewed the images personally.  PFTs: 09/30/2020 FVC 2.62 [60%], FEV1 2.56 [1%], F/F 98, TLC 6.02 [83%], DLCO 6.80 [26%] Severe diffusion defect  Labs: CTD serologies 07/15/2020-positive for ANA 1:40, nuclear  12/11/2020 Follow up : Pulmonary Fibrosis and Emphysema, post hospital follow-up Patient presents for a 16-monthfollow-up.  Patient has underlying emphysema and pulmonary fibrosis.  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  Connective tissue/autoimmune serologies were only significant for borderline ANA.  Felt to be nonspecific.  Discussed antifibrotic's last visit but held off due to his ongoing issues with weight loss and recovery from recent treatment for parotid cancer.  Patient was recently admitted earlier this month  discharged last week.  He was treated for acute on chronic respiratory failure felt secondary to pneumonia, ILD flare and COPD exacerbation.  He was treated with steroids and azithromycin.  Started on prednisone 40 mg daily   He was also felt to be at risk for aspiration.  And was changed to a dysphagia 3 diet with nectar thick liquids.  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 2D echo showed RV enlargement and elevated right-sided pressures consistent with developing cor pulmonale.  He did require diuresis with Lasix.  Did have a palliative care evaluation. Patient and wife are very determined to get better and want aggressive treatment for improvement .  Since discharge is feeling better, appetite has returned and dyspnea is less. Oxygen is really helping .  Has POC , maintain O2 sats 92% 3l/m pulsed.,  Home concentrator uses 2l/m . Sats >90% .   Complains of Progressive decline since parotid surgery and radiation .  Last radiation treatment was 09/06/20. Had a lot of weight loss. Trying hard to increase calorie intake. Doing high protein, and high calorie .  Is doing dysphagia exercises , does use nectar thick in thin liquids.  Was not started on lasix due to hypotension . Denies increased leg swelling . No orthopnea.    No Known Allergies  Immunization History  Administered Date(s) Administered  . Influenza Split 09/05/2009, 09/29/2010, 04/06/2011, 04/08/2011, 05/18/2011, 06/17/2012, 06/29/2013, 03/06/2014, 06/30/2014  . Influenza, High Dose Seasonal PF 06/30/2014, 07/08/2015, 04/10/2016, 06/03/2018, 04/15/2020  . Influenza, Quadrivalent, Recombinant, Inj, Pf 05/19/2019  . Influenza,inj,Quad PF,6+ Mos 06/29/2013  . Influenza-Unspecified 06/02/2017  . PFIZER(Purple Top)SARS-COV-2 Vaccination 09/09/2019, 09/30/2019, 05/13/2020  . Pneumococcal Conjugate-13 08/17/2008, 02/23/2013  . Pneumococcal Polysaccharide-23 09/05/2009, 09/18/2011  . Td 09/05/2009,  01/02/2013  . Tdap 02/02/2012, 02/23/2013  . Zoster 09/05/2009  Past Medical History:  Diagnosis Date  . Adenomatous colon polyp   . Cataract   . Diabetes mellitus   . Emphysema   . Emphysema of lung ([place])   . Erectile dysfunction   . Hyperlipidemia   . Hypertension   . Peripheral vascular disease ([place])   . Pneumonia   . Pulmonary nodule     Tobacco History: Social History   Tobacco Use  Smoking Status Former Smoker  . Packs/day: 1.50  . Years: 60.00  . Pack years: 90.00  . Types: Cigarettes  . Quit date: 09/17/2014  . Years since quitting: 6.2  Smokeless Tobacco Never Used   Counseling given: Not Answered   Outpatient Medications Prior to Visit  Medication Sig Dispense Refill  . acetaminophen (TYLENOL) 325 MG tablet Take 325 mg by mouth every 6 (six) hours as needed for mild pain.    Marland Kitchen albuterol (PROVENTIL) (2.5 MG/3ML) 0.083% nebulizer solution Take 3 mLs (2.5 mg total) by nebulization every 4 (four) hours as needed for shortness of breath. 75 mL 12  . ANORO ELLIPTA 62.5-25 MCG/INH AEPB Take 1 puff by mouth daily.    . Ascorbic Acid (VITAMIN C) 500 MG CAPS Take 1 capsule by mouth 2 (two) times daily.    Marland Kitchen b complex vitamins capsule Take 1 capsule by mouth daily.    . Cholecalciferol (VITAMIN D PO) Take 800 Units by mouth daily.    . Cyanocobalamin (VITAMIN B 12 PO) Take 500 mcg by mouth daily.    . insulin aspart (NOVOLOG) 100 UNIT/ML injection Inject 5 Units into the skin as directed. 2-3 times daily.    . Maltodextrin-Xanthan Gum (RESOURCE THICKENUP CLEAR) POWD Take 1 Container by mouth as needed. 1250 g 0  . mirtazapine (REMERON) 15 MG tablet Take 45 mg by mouth at bedtime.    . Multiple Vitamin (MULTIVITAMIN WITH MINERALS) TABS tablet Take 1 tablet by mouth daily. 30 tablet 0  . Nutritional Supplements (FEEDING SUPPLEMENT, KATE FARMS STANDARD 1.4,) LIQD liquid Take 325 mLs by mouth 3 (three) times daily between meals. 3250 mL 0  . OneTouch Delica Lancets 24O  MISC CHECK BLOOD SUGAR ONCE A DAY.    Glory Rosebush VERIO test strip     . pantoprazole (PROTONIX) 40 MG tablet Take 1 tablet (40 mg total) by mouth daily. 30 tablet 0  . predniSONE (DELTASONE) 20 MG tablet Take 2 tablets (40 mg total) by mouth daily with breakfast. 30 tablet 0  . protein supplement (RESOURCE BENEPROTEIN) 6 g POWD Take 1 Scoop (6 g total) by mouth 3 (three) times daily with meals. 100 packet 0   Facility-Administered Medications Prior to Visit  Medication Dose Route Frequency Provider Last Rate Last Admin  . 0.9 %  sodium chloride infusion   Intravenous Continuous Magrinat, Virgie Dad, MD 500 mL/hr at 11/26/20 1712 New Bag at 11/26/20 1712  . acetaminophen (TYLENOL) tablet 650 mg  650 mg Oral Once Brunetta Genera, MD      . albuterol (PROVENTIL) (2.5 MG/3ML) 0.083% nebulizer solution 2.5 mg  2.5 mg Nebulization Q4H PRN Brunetta Genera, MD      . loratadine (CLARITIN) tablet 10 mg  10 mg Oral Once Brunetta Genera, MD         Review of Systems:   Constitutional:   +weight loss, no night sweats,  Fevers, chills,  +fatigue, or  lassitude.  HEENT:   No headaches,  Difficulty swallowing,  Tooth/dental problems, or  Sore throat,  No sneezing, itching, ear ache, nasal congestion, post nasal drip, ++HOH   CV:  No chest pain,  Orthopnea, PND, +swelling in lower extremities,  No anasarca, dizziness, palpitations, syncope.   GI  No heartburn, indigestion, abdominal pain, nausea, vomiting, diarrhea, change in bowel habits, loss of appetite, bloody stools.   Resp:    No chest wall deformity  Skin: no rash or lesions.  GU: no dysuria, change in color of urine, no urgency or frequency.  No flank pain, no hematuria   MS:  No joint pain or swelling.  No decreased range of motion.  No back pain.    Physical Exam  BP (!) 104/58   Pulse 74   Temp (!) 97.3 F (36.3 C)   Ht 6' (1.829 m)   Wt 137 lb (62.1 kg)   SpO2 92% Comment: 2L  BMI 18.58 kg/m    GEN: A/Ox3; pleasant , NAD, thin and elderly on O2    HEENT:  [place]/AT,    NOSE-clear, THROAT-clear, no lesions, no postnasal drip or exudate noted. Along right ear with surgical scar/opening , no redness or drainage .   NECK:  Supple w/ fair ROM; no JVD; normal carotid impulses w/o bruits; no thyromegaly or nodules palpated; no lymphadenopathy.    RESP  Clear  P & A; w/o, wheezes/ rales/ or rhonchi. no accessory muscle use, no dullness to percussion  CARD:  RRR, no m/r/g, 1+ peripheral edema, pulses intact, no cyanosis or clubbing.  GI:   Soft & nt; nml bowel sounds; no organomegaly or masses detected.   Musco: Warm bil, no deformities or joint swelling noted.   Neuro: alert, no focal deficits noted.    Skin: Warm, no lesions or rashes    Lab Results:  CBC    Component Value Date/Time   WBC 3.3 (L) 12/04/2020 0516   RBC 3.70 (L) 12/04/2020 0516   HGB 9.5 (L) 12/04/2020 0516   HGB 9.9 (L) 11/12/2020 1342   HCT 30.6 (L) 12/04/2020 0516   HCT 33.3 (L) 06/18/2020 1508   PLT 62 (L) 12/04/2020 0516   PLT 82 (L) 11/12/2020 1342   MCV 82.7 12/04/2020 0516   MCH 25.7 (L) 12/04/2020 0516   MCHC 31.0 12/04/2020 0516   RDW 27.8 (H) 12/04/2020 0516   LYMPHSABS 0.6 (L) 12/02/2020 0514   MONOABS 0.4 12/02/2020 0514   EOSABS 0.0 12/02/2020 0514   BASOSABS 0.0 12/02/2020 0514    BMET    Component Value Date/Time   NA 135 12/04/2020 0516   K 4.3 12/04/2020 0516   CL 101 12/04/2020 0516   CO2 29 12/04/2020 0516   GLUCOSE 196 (H) 12/04/2020 0516   BUN 22 12/04/2020 0516   CREATININE 0.54 (L) 12/04/2020 0516   CREATININE 0.94 11/12/2020 1342   CALCIUM 8.0 (L) 12/04/2020 0516   GFRNONAA >60 12/04/2020 0516   GFRNONAA >60 11/12/2020 1342   GFRAA  11/21/2007 1340    >60        The eGFR has been calculated using the MDRD equation. This calculation has not been validated in all clinical    BNP    Component Value Date/Time   BNP 913.3 (H) 11/28/2020 2149    ProBNP No  results found for: PROBNP  Imaging: CT Soft Tissue Neck W Contrast  Result Date: 12/05/2020 CLINICAL DATA:  Carcinoma of the right parotid gland EXAM: CT NECK WITH CONTRAST TECHNIQUE: Multidetector CT imaging of the neck was performed using the standard protocol following the bolus  administration of intravenous contrast. CONTRAST:  38m OMNIPAQUE IOHEXOL 300 MG/ML  SOLN COMPARISON:  None. FINDINGS: PHARYNX AND LARYNX: The nasopharynx, oropharynx and larynx are normal. Visible portions of the oral cavity, tongue base and floor of mouth are normal. Normal epiglottis, vallecula and pyriform sinuses. The larynx is normal. No retropharyngeal abscess, effusion or lymphadenopathy. 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 The region of the right parotid gland is obscured by streak artifact from dental amalgam but there is no visible soft tissue mass. Left parotid gland is normal. Normal submandibular glands. THYROID: Normal. LYMPH NODES: No enlarged or abnormal density lymph nodes. VASCULAR: Calcific atherosclerosis of the aorta and the carotid bifurcations LIMITED INTRACRANIAL: Normal VISUALIZED ORBITS: Metallic eyelid implant on the right. MASTOIDS AND VISUALIZED PARANASAL SINUSES: No fluid levels or advanced mucosal thickening. No mastoid effusion. SKELETON: No bony spinal canal stenosis. No lytic or blastic lesions. UPPER CHEST: Severe biapical emphysema. OTHER: None. IMPRESSION: 1. Status post right parotid gland resection. The region of the right parotid gland is obscured by streak artifact from dental amalgam but there is no visible soft tissue mass. 2. No cervical lymphadenopathy. Aortic Atherosclerosis (ICD10-I70.0) and Emphysema (ICD10-J43.9). Electronically Signed   By: KUlyses JarredM.D.   On: 12/05/2020 19:20   CT Angio Chest PE W and/or Wo Contrast  Result Date: 11/28/2020 CLINICAL DATA:  Dyspnea EXAM: CT ANGIOGRAPHY CHEST WITH CONTRAST TECHNIQUE: Multidetector CT imaging of the chest was  performed using the standard protocol during bolus administration of intravenous contrast. Multiplanar CT image reconstructions and MIPs were obtained to evaluate the vascular anatomy. CONTRAST:  1061mOMNIPAQUE IOHEXOL 350 MG/ML SOLN COMPARISON:  09/26/2020 FINDINGS: Cardiovascular: There is adequate opacification of the pulmonary arterial tree. There is no intraluminal filling defect identified to suggest acute pulmonary embolism. The central pulmonary arteries are of normal caliber. Mild atherosclerotic calcification within the thoracic aorta. The thoracic aorta is dilated, measuring 4.2 x 3.9 cm in its ascending segment (coronal # 55/sagittal # 85) and 3.8 x 3.4 cm just beyond the takeoff of the left subclavian vein within the aortic arch (axial # 44/sagittal # 96). At the level of the diaphragmatic hiatus, the descending thoracic aorta measures 3.2 cm. Extensive multi-vessel coronary artery calcification. Mild global cardiomegaly with particular enlargement of the a right ventricle. No pericardial effusion. Mediastinum/Nodes: There has developed extensive bilateral hilar and mediastinal adenopathy, possibly reactive in nature given its relatively rapid development since prior examination. Index right hilar lymph node measures 2.3 cm at axial image # 70 and left hilar lymph node measures 1.5 cm at axial image # 74. The visualized thyroid is unremarkable. The esophagus is unremarkable. Lungs/Pleura: Severe centrilobular and paraseptal emphysema, more severe within the apices bilaterally. Since the prior examination, there has developed extensive bibasilar ground-glass pulmonary infiltrate and pulmonary consolidation, more severe within the right lower lobe. Small right pleural effusion has developed. Together, the findings are suspicious for atypical infection, aspiration, or less commonly, drug reaction. Small probable associated right parapneumonic effusion. No pneumothorax. The central airways are widely  patent. Upper Abdomen: No acute abnormality. Musculoskeletal: No acute bone abnormality. No suspicious lytic or blastic bone lesion. Review of the MIP images confirms the above findings. IMPRESSION: No pulmonary embolism. Severe centrilobular emphysema. Interval development of extensive bibasilar pulmonary infiltrate and consolidation most in keeping with atypical infection in the acute setting with small associated right parapneumonic effusion. Less commonly, this may be seen with aspiration or drug reaction. Extensive mediastinal adenopathy may be reactive in nature given  its relatively rapid development. Follow-up imaging in 3 months, once the patient acute issues have resolved, would be helpful in documenting resolution. Extensive multi-vessel coronary artery calcification. Mild cardiomegaly with particular enlargement of the right ventricle. Dilation of the thoracic aorta with maximal dimension of 4.2 cm within the ascending segment. Recommend annual imaging followup by CTA or MRA. This recommendation follows 2010 ACCF/AHA/AATS/ACR/ASA/SCA/SCAI/SIR/STS/SVM Guidelines for the Diagnosis and Management of Patients with Thoracic Aortic Disease. Circulation. 2010; 121: Q197-J883. Aortic aneurysm NOS (ICD10-I71.9) Aortic aneurysm NOS (ICD10-I71.9). Aortic Atherosclerosis (ICD10-I70.0) and Emphysema (ICD10-J43.9). Electronically Signed   By: Fidela Salisbury MD   On: 11/28/2020 23:19   DG Chest Port 1 View  Result Date: 11/28/2020 CLINICAL DATA:  Chronic oxygen dependent patient presenting with a 1 week history of progressively worsening shortness of breath and hypoxemia (oxygen saturation in the 70s and 80s). Current history of UIP/pulmonary fibrosis. EXAM: PORTABLE CHEST 1 VIEW COMPARISON:  Chest x-ray 07/15/2020. High-resolution CT chest 09/26/2020. FINDINGS: Cardiac silhouette upper normal in size for AP portable technique. Interstitial opacities throughout both lungs which are not felt to be significantly  changed since the high-resolution CT. More confluent opacity peripherally in the mid RIGHT lung was shown on the CT to represent scarring. IMPRESSION: Chronic interstitial lung disease/pulmonary fibrosis. No convincing evidence of acute cardiopulmonary disease. Electronically Signed   By: Evangeline Dakin M.D.   On: 11/28/2020 21:55   DG Swallowing Func-Speech Pathology  Result Date: 11/30/2020 Objective Swallowing Evaluation: Type of Study: MBS-Modified Barium Swallow Study  Patient Details Name: Geoffrey Wood MRN: [id number] Date of Birth: 1945-08-28 Today's Date: 11/30/2020 Time: SLP Start Time (ACUTE ONLY): 1045 -SLP Stop Time (ACUTE ONLY): 1115 SLP Time Calculation (min) (ACUTE ONLY): 30 min Past Medical History: Past Medical History: Diagnosis Date . Adenomatous colon polyp  . Cataract  . Diabetes mellitus  . Emphysema  . Emphysema of lung ([place])  . Erectile dysfunction  . Hyperlipidemia  . Hypertension  . Peripheral vascular disease ([place])  . Pneumonia  . Pulmonary nodule  Past Surgical History: Past Surgical History: Procedure Laterality Date . ABDOMINAL AORTIC ANEURYSM REPAIR  2004 . COLONOSCOPY  multiple since 2004 HPI: Geoffrey Wood is a 75 y.o. 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 CT Chest shows "Severe centrilobular and paraseptal emphysema, more  severe within the apices bilaterally. Since the prior examination,  there has developed extensive bibasilar ground-glass pulmonary  infiltrate and pulmonary consolidation, more severe within the right  lower lobe. Small right pleural effusion has developed. Together,  the findings are suspicious for atypical infection, aspiration, or  less commonly, drug reaction." Pt has a history of right parotid cancer s/p excision  with flap closure with facial nerve sacrifice and radiation for positive margins with +LNs: Also history of lingual cancer. On 06-03-20 Dr. Nicolette Bang performed rt parotidectomy with facial nerve dissection which ultimately req'd sacrifice of upper division of rt facial nerve. Rt selective neck dissection also that date.  Pt had three sessions of OP SLP therapy due to facial nerve paresis. Per ENT "He also recieved postoperative radiation therapy but did not f/u with SLP therapy after that.  Since that time he is always had some hearing problems and wears hearing aids but his hearing in  his right ear has been worse.  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  No data recorded Assessment / Plan / Recommendation CHL IP CLINICAL IMPRESSIONS 11/30/2020 Clinical Impression Pt demonstrates a mild to moderate dysphagia with trace to mild sensed aspiration events that now pt reports are new over the past few days.  Oral deficits include anterior loss during chin tuck, base of tongue weakness, likely on the right leading to mild premature spillage during bolus formation with trace penetration events before the swallow and one instance of significant aspiration with hard cough. Pt also has decreased laryngeal closure during the swallow though epiglottic deflection and laryngal elevation with glottic clousre are quite complete and sustained in many attempts. Suspect decreased seal of arytenoid tissue leading to penetration during the swallow and eventual trace aspiration as glottic closure is released. Pt typically senses this with a throat clear or cough, though not all aspirate is cleared. Nectar thick liquids and puree are tolerated without aspiration and a cue for an effortful swallow eliminates mild vallecular residue that was otherwise present. Generally, this dysphagia is mild and possibly temporary if pt can improve conditioning and general strength and pulmonary  hygiene. Recommend puree (pts baseline as he does not masticate solids since surgery)and nectar thick liquids during acute phase of rehabilitation. Will benefit from RMST, training for preventative cough strategy and strengthning for increased laryngeal closure as well as preventative oral hygiene measures. SLP Visit Diagnosis Dysphagia, oropharyngeal phase (R13.12) Attention and concentration deficit following -- Frontal lobe and executive function deficit following -- Impact on safety and function Moderate aspiration risk;Risk for inadequate nutrition/hydration   CHL IP TREATMENT RECOMMENDATION 11/30/2020 Treatment Recommendations Therapy as outlined in treatment plan below   Prognosis 11/30/2020 Prognosis for Safe Diet Advancement Good Barriers to Reach Goals -- Barriers/Prognosis Comment -- CHL IP DIET RECOMMENDATION 11/30/2020 SLP Diet Recommendations Dysphagia 1 (Puree) solids;Nectar thick liquid Liquid Administration via Straw Medication Administration Whole meds with liquid Compensations Slow rate;Small sips/bites;Effortful swallow;Clear throat after each swallow Postural Changes --   No flowsheet data found.  CHL IP FOLLOW UP RECOMMENDATIONS 11/30/2020 Follow up Recommendations Inpatient Rehab   CHL IP FREQUENCY AND DURATION 11/30/2020 Speech Therapy Frequency (ACUTE ONLY) min 2x/week Treatment Duration 2 weeks      CHL IP ORAL PHASE 11/30/2020 Oral Phase Impaired Oral - Pudding Teaspoon -- Oral - Pudding Cup -- Oral - Honey Teaspoon -- Oral - Honey Cup -- Oral - Nectar Teaspoon -- Oral - Nectar Cup -- Oral - Nectar Straw Premature spillage Oral - Thin Teaspoon -- Oral - Thin Cup -- Oral - Thin Straw Premature spillage;Right anterior bolus loss Oral - Puree WFL Oral - Mech Soft -- Oral - Regular -- Oral - Multi-Consistency -- Oral - Pill -- Oral Phase - Comment --  CHL IP PHARYNGEAL PHASE 11/30/2020 Pharyngeal Phase Impaired Pharyngeal- Pudding Teaspoon -- Pharyngeal -- Pharyngeal- Pudding Cup -- Pharyngeal --  Pharyngeal- Honey Teaspoon -- Pharyngeal -- Pharyngeal- Honey Cup -- Pharyngeal -- Pharyngeal- Nectar Teaspoon -- Pharyngeal -- Pharyngeal- Nectar Cup -- Pharyngeal -- Pharyngeal- Nectar Straw Reduced epiglottic inversion;Reduced tongue base retraction;Pharyngeal residue - valleculae Pharyngeal -- Pharyngeal- Thin Teaspoon -- Pharyngeal -- Pharyngeal- Thin Cup -- Pharyngeal -- Pharyngeal- Thin Straw Trace aspiration;Penetration/Aspiration before swallow;Penetration/Aspiration during swallow;Reduced airway/laryngeal closure;Reduced epiglottic inversion Pharyngeal -- Pharyngeal- Puree Pharyngeal residue - valleculae Pharyngeal -- Pharyngeal- Mechanical Soft -- Pharyngeal -- Pharyngeal- Regular -- Pharyngeal -- Pharyngeal- Multi-consistency -- Pharyngeal -- Pharyngeal- Pill --  Pharyngeal -- Pharyngeal Comment --  No flowsheet data found. DeBlo[place], Katherene Ponto 11/30/2020, 11:55 AM              ECHOCARDIOGRAM COMPLETE  Result Date: 11/29/2020    ECHOCARDIOGRAM REPORT   Patient Name:   Geoffrey Wood Date of Exam: 11/29/2020 Medical Rec #:  [id number]       Height:       72.0 in Accession #:    [id number]      Weight:       143.8 lb Date of Birth:  [birth year]        BSA:          1.852 m Patient Age:    18 years        BP:           94/54 mmHg Patient Gender: M               HR:           65 bpm. Exam Location:  Inpatient Procedure: 2D Echo, Cardiac Doppler and Color Doppler Indications:    COPD  History:        Patient has prior history of Echocardiogram examinations, most                 recent 08/26/2017. Risk Factors:Dyslipidemia, Hypertension and                 Diabetes.  Sonographer:    Luisa Hart RDCS Referring Phys: [place]  1. Left ventricular ejection fraction, by estimation, is 65 to 70%. The left ventricle has normal function. The left ventricle has no regional wall motion abnormalities. Left ventricular diastolic parameters were normal.  2. Right ventricular systolic function  is low normal. The right ventricular size is moderately enlarged. There is mildly elevated pulmonary artery systolic pressure.  3. Left atrial size was mild to moderately dilated.  4. Right atrial size was mild to moderately dilated.  5. The mitral valve is grossly normal. Trivial mitral valve regurgitation.  6. The aortic valve is tricuspid. There is mild calcification of the aortic valve. There is mild thickening of the aortic valve. Aortic valve regurgitation is not visualized. Mild aortic valve sclerosis is present, with no evidence of aortic valve stenosis.  7. The inferior vena cava is dilated in size with <50% respiratory variability, suggesting right atrial pressure of 15 mmHg. Comparison(s): Prior images unable to be directly viewed, comparison made by report only. Conclusion(s)/Recommendation(s): RV moderately enlarged, with mildly elevated RVSP. Normal LVEF. FINDINGS  Left Ventricle: Left ventricular ejection fraction, by estimation, is 65 to 70%. The left ventricle has normal function. The left ventricle has no regional wall motion abnormalities. The left ventricular internal cavity size was normal in size. There is  borderline left ventricular hypertrophy. Left ventricular diastolic parameters were normal. Right Ventricle: The right ventricular size is moderately enlarged. Right vetricular wall thickness was not well visualized. Right ventricular systolic function is low normal. There is mildly elevated pulmonary artery systolic pressure. The tricuspid regurgitant velocity is 2.57 m/s, and with an assumed right atrial pressure of 15 mmHg, the estimated right ventricular systolic pressure is 70.7 mmHg. Left Atrium: Left atrial size was mild to moderately dilated. Right Atrium: Right atrial size was mild to moderately dilated. Pericardium: There is no evidence of pericardial effusion. Mitral Valve: The mitral valve is grossly normal. Trivial mitral valve regurgitation. Tricuspid Valve: The tricuspid valve  is normal in structure. Tricuspid valve regurgitation  is mild. Aortic Valve: The aortic valve is tricuspid. There is mild calcification of the aortic valve. There is mild thickening of the aortic valve. Aortic valve regurgitation is not visualized. Mild aortic valve sclerosis is present, with no evidence of aortic valve stenosis. Aortic valve mean gradient measures 6.0 mmHg. Aortic valve peak gradient measures 11.8 mmHg. Aortic valve area, by VTI measures 2.51 cm. Pulmonic Valve: The pulmonic valve was grossly normal. Pulmonic valve regurgitation is not visualized. No evidence of pulmonic stenosis. Aorta: The aortic root and ascending aorta are structurally normal, with no evidence of dilitation. Venous: The inferior vena cava is dilated in size with less than 50% respiratory variability, suggesting right atrial pressure of 15 mmHg. IAS/Shunts: The atrial septum is grossly normal.  LEFT VENTRICLE PLAX 2D LVIDd:         4.66 cm     Diastology LVIDs:         2.88 cm     LV e' medial:    7.72 cm/s LV PW:         1.20 cm     LV E/e' medial:  6.0 LV IVS:        0.70 cm     LV e' lateral:   10.40 cm/s LVOT diam:     2.60 cm     LV E/e' lateral: 4.5 LV SV:         100 LV SV Index:   54 LVOT Area:     5.31 cm  LV Volumes (MOD) LV vol d, MOD A2C: 85.5 ml LV vol s, MOD A2C: 28.9 ml LV SV MOD A2C:     56.6 ml RIGHT VENTRICLE RV S prime:     10.10 cm/s  PULMONARY VEINS TAPSE (M-mode): 1.6 cm      A Reversal Duration: 77.00 msec                             A Reversal Velocity: 16.80 cm/s                             Diastolic Velocity:  40.97 cm/s                             S/D Velocity:        1.40                             Systolic Velocity:   35.32 cm/s AORTIC VALVE                    PULMONIC VALVE AV Area (Vmax):    2.58 cm     PV Vmax:       0.94 m/s AV Area (Vmean):   2.81 cm     PV Vmean:      79.900 cm/s AV Area (VTI):     2.51 cm     PV VTI:        0.263 m AV Vmax:           172.00 cm/s  PV Peak grad:  3.5 mmHg  AV Vmean:          113.000 cm/s PV Mean grad:  3.0 mmHg AV VTI:            0.400 m AV Peak Grad:  11.8 mmHg AV Mean Grad:      6.0 mmHg LVOT Vmax:         83.50 cm/s LVOT Vmean:        59.900 cm/s LVOT VTI:          0.189 m LVOT/AV VTI ratio: 0.47  AORTA Ao Root diam: 4.00 cm Ao Asc diam:  3.70 cm MITRAL VALVE               TRICUSPID VALVE MV Area (PHT): 2.50 cm    TR Peak grad:   26.4 mmHg MV Decel Time: 303 msec    TR Vmax:        257.00 cm/s MV E velocity: 46.70 cm/s MV A velocity: 83.90 cm/s  SHUNTS MV E/A ratio:  0.56        Systemic VTI:  0.19 m                            Systemic Diam: 2.60 cm Buford Dresser MD Electronically signed by Buford Dresser MD Signature Date/Time: 11/29/2020/6:11:02 PM    Final     ferric carboxymaltose (INJECTAFER) 750 mg in sodium chloride 0.9 % 250 mL IVPB    Date Action Dose Route User   Discharged on 12/04/2020   Admitted on 11/28/2020   11/19/2020 1443 New Bag/Given 750 mg Intravenous Janine Ores, RN    ferric carboxymaltose (INJECTAFER) 750 mg in sodium chloride 0.9 % 250 mL IVPB    Date Action Dose Route User   Discharged on 12/04/2020   Admitted on 11/28/2020   11/26/2020 1521 Infusion Verify (none) Intravenous Tildon Husky, RN   11/26/2020 1519 Infusion Verify (none) Intravenous Tildon Husky, RN   11/26/2020 1518 Restarted (none) Intravenous Tildon Husky, RN   11/26/2020 1453 New Bag/Given 750 mg Intravenous Janine Ores, RN    0.9 %  sodium chloride infusion    Date Action Dose Route User   Discharged on 12/04/2020   Admitted on 11/28/2020   11/19/2020 1429 New Bag/Given (none) Intravenous Janine Ores, RN    0.9 %  sodium chloride infusion    Date Action Dose Route User   Discharged on 12/04/2020   Admitted on 11/28/2020   11/26/2020 1647 Infusion Verify (none) Intravenous Tildon Husky, RN   11/26/2020 1646 Rate/Dose Change (none) Intravenous Tildon Husky, RN   11/26/2020 1558 Rate/Dose Change (none) Intravenous Tildon Husky, RN   11/26/2020 1547 Infusion Verify (none) Intravenous Tildon Husky, RN   11/26/2020 1523 Infusion Verify (none) Intravenous Tildon Husky, RN    0.9 %  sodium chloride infusion    Date Action Dose Route User   Discharged on 12/04/2020   Admitted on 11/28/2020   11/26/2020 1712 New Bag/Given (none) Intravenous Lester Belfield, RN      PFT Results Latest Ref Rng & Units 09/30/2020  FVC-Pre L 2.77  FVC-Predicted Pre % 63  FVC-Post L 2.62  FVC-Predicted Post % 60  Pre FEV1/FVC % % 88  Post FEV1/FCV % % 98  FEV1-Pre L 2.43  FEV1-Predicted Pre % 76  FEV1-Post L 2.56  DLCO uncorrected ml/min/mmHg 6.80  DLCO UNC% % 26  DLCO corrected ml/min/mmHg 8.21  DLCO COR %Predicted % 31  DLVA Predicted % 43  TLC L 6.02  TLC % Predicted % 83  RV % Predicted % 129    No results found for: NITRICOXIDE      Assessment &  Plan:   COPD exacerbation ([place]) Recent exacerbation with associated pneumonia and possible ILD flare Patient is now starting to have some clinical improvement.  We will continue on maintenance therapy We will decrease prednisone slowly over the next 2 weeks.  Plan  Patient Instructions  Decrease Prednisone 62m daily for 5 days then 220mdaily for 5 days and 1083maily for 5 days and then stop .  ANORO 1 puff daily.  Albuterol inhaler or neb As needed   Continue on Oxygen 2l/m  Continue on Oxygen 3l/m on POC .  Follow up with Dr ManVaughan Browner Mahayla Haddaway NP  in 4 weeks with chest xray .  Please contact office for sooner follow up if symptoms do not improve or worsen or seek emergency care        IPF (idiopathic pulmonary fibrosis) (HCCAmeryrobable flare with underlying COPD exacerbation and pneumonia.  Patient is clinically improving on steroids.  Continue on a slow taper. Hold off on antifibrotic's at this time  Plan Patient Instructions  Decrease Prednisone 70m28mily for 5 days then 20mg53mly for 5 days and 10mg 48my for 5 days and then stop .  ANORO  1 puff daily.  Albuterol inhaler or neb As needed   Continue on Oxygen 2l/m  Continue on Oxygen 3l/m on POC .  Follow up with Dr MannamVaughan Brownerrrett NP  in 4 weeks with chest xray .  Please contact office for sooner follow up if symptoms do not improve or worsen or seek emergency care        CAP (community acquired pneumonia) Bibasilar pneumonia, he is at risk for aspiration.  Encourage patient to continue with aspiration precautions. Clinically is making improvement.  We will check chest x-ray on return visit and will consider CT chest going forward in 3 months.  Plan  Patient Instructions  Decrease Prednisone 70mg d9m for 5 days then 20mg da59mfor 5 days and 10mg dai21mor 5 days and then stop .  ANORO 1 puff daily.  Albuterol inhaler or neb As needed   Continue on Oxygen 2l/m  Continue on Oxygen 3l/m on POC .  Follow up with Dr Mannam OrVaughan Brownertt NP  in 4 weeks with chest xray .  Please contact office for sooner follow up if symptoms do not improve or worsen or seek emergency care        Protein-calorie malnutrition, severe Continue on high-protein and calorie diet with supplements  Chronic respiratory failure with hypoxia (HCC) Now [place] is oxygen dependent.  Continue on current settings.  Malignant neoplasm of parotid gland (HCC) [facility]  Dysphagia Continue with dysphagia diet.  Aspiration precautions discussed with patient and wife     Vaidehi Braddy ParRexene Edison/2022

## 2020-12-11 NOTE — Patient Instructions (Addendum)
Decrease Prednisone 30mg  daily for 5 days then 20mg  daily for 5 days and 10mg  daily for 5 days and then stop .  ANORO 1 puff daily.  Albuterol inhaler or neb As needed   Continue on Oxygen 2l/m  Continue on Oxygen 3l/m on POC .  Follow up with Dr Vaughan Browner Or Falan Hensler NP  in 4 weeks with chest xray .  Please contact office for sooner follow up if symptoms do not improve or worsen or seek emergency care

## 2020-12-11 NOTE — Assessment & Plan Note (Signed)
Probable flare with underlying COPD exacerbation and pneumonia.  Patient is clinically improving on steroids.  Continue on a slow taper. Hold off on antifibrotic's at this time  Plan Patient Instructions  Decrease Prednisone 30mg  daily for 5 days then 20mg  daily for 5 days and 10mg  daily for 5 days and then stop .  ANORO 1 puff daily.  Albuterol inhaler or neb As needed   Continue on Oxygen 2l/m  Continue on Oxygen 3l/m on POC .  Follow up with Dr Vaughan Browner Or Kaia Depaolis NP  in 4 weeks with chest xray .  Please contact office for sooner follow up if symptoms do not improve or worsen or seek emergency care

## 2020-12-11 NOTE — Assessment & Plan Note (Signed)
Recent exacerbation with associated pneumonia and possible ILD flare Patient is now starting to have some clinical improvement.  We will continue on maintenance therapy We will decrease prednisone slowly over the next 2 weeks.  Plan  Patient Instructions  Decrease Prednisone 30mg  daily for 5 days then 20mg  daily for 5 days and 10mg  daily for 5 days and then stop .  ANORO 1 puff daily.  Albuterol inhaler or neb As needed   Continue on Oxygen 2l/m  Continue on Oxygen 3l/m on POC .  Follow up with Dr Vaughan Browner Or Gurnoor Ursua NP  in 4 weeks with chest xray .  Please contact office for sooner follow up if symptoms do not improve or worsen or seek emergency care

## 2020-12-11 NOTE — Assessment & Plan Note (Signed)
Now patient is oxygen dependent.  Continue on current settings.

## 2020-12-11 NOTE — Assessment & Plan Note (Signed)
Continue on high-protein and calorie diet with supplements

## 2020-12-11 NOTE — Assessment & Plan Note (Signed)
Bibasilar pneumonia, he is at risk for aspiration.  Encourage patient to continue with aspiration precautions. Clinically is making improvement.  We will check chest x-ray on return visit and will consider CT chest going forward in 3 months.  Plan  Patient Instructions  Decrease Prednisone 30mg  daily for 5 days then 20mg  daily for 5 days and 10mg  daily for 5 days and then stop .  ANORO 1 puff daily.  Albuterol inhaler or neb As needed   Continue on Oxygen 2l/m  Continue on Oxygen 3l/m on POC .  Follow up with Dr Vaughan Browner Or Samika Vetsch NP  in 4 weeks with chest xray .  Please contact office for sooner follow up if symptoms do not improve or worsen or seek emergency care

## 2020-12-11 NOTE — Assessment & Plan Note (Signed)
Continue with dysphagia diet.  Aspiration precautions discussed with patient and wife

## 2020-12-12 ENCOUNTER — Other Ambulatory Visit: Payer: Self-pay | Admitting: *Deleted

## 2020-12-12 ENCOUNTER — Ambulatory Visit: Payer: Medicare Other

## 2020-12-12 NOTE — Patient Outreach (Signed)
[name]) Care Management  12/12/2020  Geoffrey Wood October 07, 1945 [id number]   Telephone Assessment-Successful-Declined Transition of care completed by the provider  Spoke with the pt's caregiver [name]) and explained [facility] and the purpose for today's. Spouse [name]) who reports pt is doing much better now and has [facility] is involved with his care. States the RN will visit tomorrow and the PT has visited once and will visited once again tomorrow. States the pt will also have speech therapy in the home. Pt currently on face-mask for his oxygenation. Concerning his COPD with no acute distress, breathing well with no problems and has not used his PRN inhalers or nebulizers. Reports pt has followed up with his provider's NP on last Tuesday who discussed his insulin dosages, NP with his pulmonologist and pending appointment with this hematologist on 5/10.   Caregiver explained incidences that occurred at the hospital during the pt's visit related to his oxygen and inquired on who to call with such complaints. RN encouraged caregiver to call Patient Relations. Caregiver states she and her spouse hare very exhausted with all the doctor visits however pt is progressing and getting back into his routine with use of his portable O2 tanks.   RN further offered [facility] services however declined as caregiver indicated pt is being managed well with no additional needs at this time. Inquired on Palliative services due to pt's diagnosis however caregiver indicated this has been discussed however pt is not at this stage for a referral at this time. No additional needs or inquires at this time. RN alerted caregiver on other services [facility] can provide and offered to send a information packet for future services if needed (receptive).  Will close case and alert primary provider of pt's declined for services.  Raina Mina, RN Care Management Coordinator [place]  Office [phone]

## 2020-12-17 ENCOUNTER — Inpatient Hospital Stay: Payer: Medicare Other | Attending: Hematology | Admitting: Nutrition

## 2020-12-17 ENCOUNTER — Telehealth: Payer: Self-pay | Admitting: Nutrition

## 2020-12-17 NOTE — Telephone Encounter (Signed)
Telephone follow up completed with patient's wife. She reports patient is weaning prednisone.  States his appetite has improved.  His activity has increased.  His weight continues to decline and was documented as 131.2 pounds on Monday per patient's wife on home scale.  Patient has been prescribed a dysphagia 3 nectar thick diet which wife says he is compliant with.  He was told by home health nurse to eat only "natural proteins" and nothing with any sugar in it (cake)  however wife concerned regarding continued weight loss.  She has switched patient back to [employer] supplements and Ensure complete to help with weight gain.  She also states blood sugars have improved on insulin and decreased prednisone.  Nutrition diagnosis: Inadequate oral intake improving.  Intervention: Educated to continue strategies for higher calorie, higher protein foods and small frequent meals and snacks. Continue to provide moderate carbohydrate intake for additional calories.  Pare with a protein or a fat source to help glycemic control. Continue oral nutrition supplements. Continue dysphagia 3 nectar thick per MD.  Monitoring, evaluation, goals: Patient will tolerate increased calories and protein to minimize further weight loss.  Next visit: Telephone call in 1 to 2 weeks.  **Disclaimer: This note was dictated with voice recognition software. Similar sounding words can inadvertently be transcribed and this note may contain transcription errors which may not have been corrected upon publication of note.**

## 2020-12-17 NOTE — Progress Notes (Signed)
See telephone note.

## 2020-12-23 NOTE — Progress Notes (Signed)
HEMATOLOGY/ONCOLOGY CLINIC NOTE  Date of Service: 12/24/2020  Patient Care Team: Crist Infante, MD as PCP - General (Internal Medicine) Nahser, Wonda Cheng, MD as PCP - Cardiology (Cardiology) Eppie Gibson, MD as Consulting Physician (Radiation Oncology) Malmfelt, Stephani Police, RN as Oncology Nurse Navigator  CHIEF COMPLAINTS/PURPOSE OF CONSULTATION:  Anemia and Thrombocytopenia  HISTORY OF PRESENTING ILLNESS:  Geoffrey Wood is a wonderful 75 y.o. male who has been referred to [place] by Caprice Beaver, AG-NP for evaluation and management of anemia and thrombocytopenia. Pt is accompanied today by his wife. The pt reports that he is doing well overall.   The pt reports that he was not told of his thrombocytopenia prior to his Parotidectomy. Pt first noticed enlargement of his parotid gland in August of this year. 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 They plan on grafting skin over the previously excised area, then completing radiation therapy for positive margins. Pt was started on Celebrex post-op and has already discontinued. He was diagnosed with double pneumonia last week when increased shortness of breath was observed. Pt was given a Z Pack and Augmentin. He has been completing these medications as prescribed.   Prior to the Parotidectomy Dr. Joylene Draft lowered his Lipitor and Synjardy dosages by half. He denies any other medication changes prior to his surgery. Pt was previously told about a Vitamin B12 deficiency and was placed on a B12 supplement by Dr. Joylene Draft.   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   Most recent lab results (06/14/2020) of CBC is as follows: all values are WNL except for RBC at 3.68, Hgb at 9.7, HCT at 29.9, RDW at 28.0, PLT at 70.K, nRBC at 8.9, Abs Immature Granulocytes at 0.21K, Sodium at 131, Glucose at 138, Calcium at  8.5. 05/31/2020 Hgb at 11.0, PLT at 41K.  On review of systems, pt reports delicate skin, bruising and denies bloody/black stools, hematuria, nose bleeds, gum bleeds and any other symptoms.   On PMHx the pt reports Diabetes, Emphysema, HLD, HTN, Peripheral vascular disease, Tongue cancer, Skin cancer. On Social Hx the pt reports that he was a previous 50+ year smoker. He drinks alcohol a few times per year. On Family Hx the pt reports no known bleeding or blood clotting disorders.   INTERVAL HISTORY:  Geoffrey Wood is a wonderful 75 y.o. male who is here for evaluation and management of anemia and thrombocytopenia. We are joined today by his wife. The patient's last visit with [place] was on 11/12/2020. The pt reports that he is doing well overall.  The pt reports that he slipped and fell in his backyard on Saturday. The pt notes that he is currently now on 2-4L oxygen. The pt received IV iron, noting this did not help his energy much. The pt was in the hospital on 11/28/2020 with SOB and oxygen levels in the 60s and 70s. The pt called his PCP's office and they were instructed to go to the hospital. The pt spent six dayd in the hospital and they believe it is due to COPD exacerbation. The pt is also on 40 mg Prednisone daily upon discharge and is currently on 10 mg today. The pt has gained two pounds since being on oxygen in the last week. The pt has been staying up later due to being instructed he was supposed to wait  3 hours prior to bedtime after eating. The pt's wife notes that his sugars have been higher, around 219 when fasted in the morning. The pt's wife notes some bruising now on his neck. The pt also has clubbing on his fingers chronically over the last ten years. The pt has had the bruising on his arms chronically, but the neck bruise spots are new. The pt still cares for himself completely and notes his energy levels have been improved since starting on the oxygen. The pt still drives himself  and functions independently. The pt has limited chewing without pain and is able to move his mouth without pain. The pt still drinks many nutritional drinks daily. The pt notes his appetite is increasing.   Lab results today 12/24/2020 of CBC w/diff and CMP is as follows: all values are WNL except for RBC of 3.59, Hgb of 9.1, HCT of 29.1, MCH of 25.3, RDW of 25.9, Plt of 48K, nRBC of 12.1, Sodium of 130, Potassium of 5.2, Chloride of 94, Glucose of 352, BUN of 33, Albumin of 2.7, AST of 14, Total Bilirubin of 1.7. 12/24/2020 Immature Plt Fract of 44.9. 12/24/2020 Iron of 61, Sat Ratio of 34. 12/24/2020 Ferritin pending.  On review of systems, pt reports SOB oxygen demand, recent fall, weight gain and denies abdominal pain, fevers, chills, leg swelling, and any other symptoms.  MEDICAL HISTORY:  Past Medical History:  Diagnosis Date  . Adenomatous colon polyp   . Cataract   . Diabetes mellitus   . Emphysema   . Emphysema of lung ([place])   . Erectile dysfunction   . Hyperlipidemia   . Hypertension   . Peripheral vascular disease ([place])   . Pneumonia   . Pulmonary nodule     SURGICAL HISTORY: Past Surgical History:  Procedure Laterality Date  . ABDOMINAL AORTIC ANEURYSM REPAIR  2004  . COLONOSCOPY  multiple since 2004    SOCIAL HISTORY: Social History   Socioeconomic History  . Marital status: Married    Spouse name: Not on file  . Number of children: Not on file  . Years of education: Not on file  . Highest education level: Not on file  Occupational History  . Occupation: Scientist, clinical (histocompatibility and immunogenetics): Designer, television/film set  Tobacco Use  . Smoking status: Former Smoker    Packs/day: 1.50    Years: 60.00    Pack years: 90.00    Types: Cigarettes    Quit date: 09/17/2014    Years since quitting: 6.2  . Smokeless tobacco: Never Used  Vaping Use  . Vaping Use: Never used  Substance and Sexual Activity  . Alcohol use: Yes    Comment: not even 1 drink a week  . Drug use:  No  . Sexual activity: Not Currently  Other Topics Concern  . Not on file  Social History Narrative  . Not on file   Social Determinants of Health   Financial Resource Strain: Not on file  Food Insecurity: No Food Insecurity  . Worried About Charity fundraiser in the Last Year: Never true  . Ran Out of Food in the Last Year: Never true  Transportation Needs: No Transportation Needs  . Lack of Transportation (Medical): No  . Lack of Transportation (Non-Medical): No  Physical Activity: Not on file  Stress: Not on file  Social Connections: Not on file  Intimate Partner Violence: Not on file    FAMILY HISTORY: Family History  Problem Relation Age of Onset  .  Heart failure Mother   . Diabetes Mother   . Cancer Father        lung  . Colon cancer Neg Hx   . Esophageal cancer Neg Hx   . Liver cancer Neg Hx   . Pancreatic cancer Neg Hx   . Rectal cancer Neg Hx   . Stomach cancer Neg Hx     ALLERGIES:  has No Known Allergies.  MEDICATIONS:  Current Outpatient Medications  Medication Sig Dispense Refill  . acetaminophen (TYLENOL) 325 MG tablet Take 325 mg by mouth every 6 (six) hours as needed for mild pain.    Marland Kitchen albuterol (PROVENTIL) (2.5 MG/3ML) 0.083% nebulizer solution Take 3 mLs (2.5 mg total) by nebulization every 4 (four) hours as needed for shortness of breath. 75 mL 12  . ANORO ELLIPTA 62.5-25 MCG/INH AEPB Take 1 puff by mouth daily.    . Ascorbic Acid (VITAMIN C) 500 MG CAPS Take 1 capsule by mouth 2 (two) times daily.    Marland Kitchen b complex vitamins capsule Take 1 capsule by mouth daily.    . Cholecalciferol (VITAMIN D PO) Take 800 Units by mouth daily.    . Cyanocobalamin (VITAMIN B 12 PO) Take 500 mcg by mouth daily.    . insulin aspart (NOVOLOG) 100 UNIT/ML injection Inject 5 Units into the skin as directed. 2-3 times daily.    . Maltodextrin-Xanthan Gum (RESOURCE THICKENUP CLEAR) POWD Take 1 Container by mouth as needed. 1250 g 0  . mirtazapine (REMERON) 15 MG tablet  Take 45 mg by mouth at bedtime.    . Multiple Vitamin (MULTIVITAMIN WITH MINERALS) TABS tablet Take 1 tablet by mouth daily. 30 tablet 0  . Nutritional Supplements (FEEDING SUPPLEMENT, KATE FARMS STANDARD 1.4,) LIQD liquid Take 325 mLs by mouth 3 (three) times daily between meals. 3250 mL 0  . OneTouch Delica Lancets 78H MISC CHECK BLOOD SUGAR ONCE A DAY.    Glory Rosebush VERIO test strip     . pantoprazole (PROTONIX) 40 MG tablet Take 1 tablet (40 mg total) by mouth daily. 30 tablet 0  . predniSONE (DELTASONE) 20 MG tablet Take 2 tablets (40 mg total) by mouth daily with breakfast. 30 tablet 0  . protein supplement (RESOURCE BENEPROTEIN) 6 g POWD Take 1 Scoop (6 g total) by mouth 3 (three) times daily with meals. 100 packet 0   No current facility-administered medications for this visit.   Facility-Administered Medications Ordered in Other Visits  Medication Dose Route Frequency Provider Last Rate Last Admin  . 0.9 %  sodium chloride infusion   Intravenous Continuous Magrinat, Virgie Dad, MD 500 mL/hr at 11/26/20 1712 New Bag at 11/26/20 1712  . acetaminophen (TYLENOL) tablet 650 mg  650 mg Oral Once Brunetta Genera, MD      . albuterol (PROVENTIL) (2.5 MG/3ML) 0.083% nebulizer solution 2.5 mg  2.5 mg Nebulization Q4H PRN Brunetta Genera, MD      . loratadine (CLARITIN) tablet 10 mg  10 mg Oral Once Brunetta Genera, MD        REVIEW OF SYSTEMS:   10 Point review of Systems was done is negative except as noted above.  PHYSICAL EXAMINATION: ECOG PERFORMANCE STATUS: 1 - Symptomatic but completely ambulatory  . Vitals:   12/24/20 1503  BP: 103/63  Pulse: (!) 103  Resp: 20  Temp: (!) 97.3 F (36.3 C)  SpO2: (!) 85%   Filed Weights   12/24/20 1503  Weight: 139 lb 14.4 oz (63.5 kg)   .  Body mass index is 18.97 kg/m.   Exam was given in a chair.  GENERAL:alert, in no acute distress and comfortable SKIN: no acute rashes, no significant lesions EYES: conjunctiva are pink  and non-injected, sclera anicteric OROPHARYNX: MMM, no exudates, no oropharyngeal erythema or ulceration NECK: supple, no JVD LYMPH:  no palpable lymphadenopathy in the cervical, axillary or inguinal regions LUNGS: clear to auscultation b/l with normal respiratory effort HEART: regular rate & rhythm ABDOMEN:  normoactive bowel sounds , non tender, not distended. Extremity: no pedal edema PSYCH: alert & oriented x 3 with fluent speech NEURO: no focal motor/sensory deficits  LABORATORY DATA:  I have reviewed the data as listed  . CBC Latest Ref Rng & Units 12/24/2020 12/04/2020 12/03/2020  WBC 4.0 - 10.5 K/uL 4.4 3.3(L) 3.7(L)  Hemoglobin 13.0 - 17.0 g/dL 9.1(L) 9.5(L) 9.1(L)  Hematocrit 39.0 - 52.0 % 29.1(L) 30.6(L) 29.8(L)  Platelets 150 - 400 K/uL 48(L) 62(L) 63(L)    . CMP Latest Ref Rng & Units 12/24/2020 12/04/2020 12/03/2020  Glucose 70 - 99 mg/dL 352(H) 196(H) 143(H)  BUN 8 - 23 mg/dL 33(H) 22 23  Creatinine 0.61 - 1.24 mg/dL 0.99 0.54(L) 0.50(L)  Sodium 135 - 145 mmol/L 130(L) 135 135  Potassium 3.5 - 5.1 mmol/L 5.2(H) 4.3 4.0  Chloride 98 - 111 mmol/L 94(L) 101 102  CO2 22 - 32 mmol/L _0 Calcium 8.9 - 10.3 mg/dL 9.2 8.0(L) 7.8(L)  Total Protein 6.5 - 8.1 g/dL 7.2 - -  Total Bilirubin 0.3 - 1.2 mg/dL 1.7(H) - -  Alkaline Phos 38 - 126 U/L 84 - -  AST 15 - 41 U/L 14(L) - -  ALT 0 - 44 U/L 11 - -   Component     Latest Ref Rng & Units 09/17/2020  Iron     42 - 163 ug/dL 30 (L)  TIBC     202 - 409 ug/dL 199 (L)  Saturation Ratios     20 - 55 % 15 (L)  UIBC     117 - 376 ug/dL 168  Vitamin B12     180 - 914 pg/mL 1,115 (H)  Ferritin     24 - 336 ng/mL 201      RADIOGRAPHIC STUDIES: I have personally reviewed the radiological images as listed and agreed with the findings in the report. CT Soft Tissue Neck W Contrast  Result Date: 12/05/2020 CLINICAL DATA:  Carcinoma of the right parotid gland EXAM: CT NECK WITH CONTRAST TECHNIQUE: Multidetector CT imaging  of the neck was performed using the standard protocol following the bolus administration of intravenous contrast. CONTRAST:  54m OMNIPAQUE IOHEXOL 300 MG/ML  SOLN COMPARISON:  None. FINDINGS: PHARYNX AND LARYNX: The nasopharynx, oropharynx and larynx are normal. Visible portions of the oral cavity, tongue base and floor of mouth are normal. Normal epiglottis, vallecula and pyriform sinuses. The larynx is normal. No retropharyngeal abscess, effusion or lymphadenopathy. 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 The region of the right parotid gland is obscured by streak artifact from dental amalgam but there is no visible soft tissue mass. Left parotid gland is normal. Normal submandibular glands. THYROID: Normal. LYMPH NODES: No enlarged or abnormal density lymph nodes. VASCULAR: Calcific atherosclerosis of the aorta and the carotid bifurcations LIMITED INTRACRANIAL: Normal VISUALIZED ORBITS: Metallic eyelid implant on the right. MASTOIDS AND VISUALIZED PARANASAL SINUSES: No fluid levels or advanced mucosal thickening. No mastoid effusion. SKELETON: No bony spinal canal stenosis. No lytic or blastic lesions. UPPER  CHEST: Severe biapical emphysema. OTHER: None. IMPRESSION: 1. Status post right parotid gland resection. The region of the right parotid gland is obscured by streak artifact from dental amalgam but there is no visible soft tissue mass. 2. No cervical lymphadenopathy. Aortic Atherosclerosis (ICD10-I70.0) and Emphysema (ICD10-J43.9). Electronically Signed   By: Ulyses Jarred M.D.   On: 12/05/2020 19:20   CT Angio Chest PE W and/or Wo Contrast  Result Date: 11/28/2020 CLINICAL DATA:  Dyspnea EXAM: CT ANGIOGRAPHY CHEST WITH CONTRAST TECHNIQUE: Multidetector CT imaging of the chest was performed using the standard protocol during bolus administration of intravenous contrast. Multiplanar CT image reconstructions and MIPs were obtained to evaluate the vascular anatomy. CONTRAST:  130m OMNIPAQUE IOHEXOL  350 MG/ML SOLN COMPARISON:  09/26/2020 FINDINGS: Cardiovascular: There is adequate opacification of the pulmonary arterial tree. There is no intraluminal filling defect identified to suggest acute pulmonary embolism. The central pulmonary arteries are of normal caliber. Mild atherosclerotic calcification within the thoracic aorta. The thoracic aorta is dilated, measuring 4.2 x 3.9 cm in its ascending segment (coronal # 55/sagittal # 85) and 3.8 x 3.4 cm just beyond the takeoff of the left subclavian vein within the aortic arch (axial # 44/sagittal # 96). At the level of the diaphragmatic hiatus, the descending thoracic aorta measures 3.2 cm. Extensive multi-vessel coronary artery calcification. Mild global cardiomegaly with particular enlargement of the a right ventricle. No pericardial effusion. Mediastinum/Nodes: There has developed extensive bilateral hilar and mediastinal adenopathy, possibly reactive in nature given its relatively rapid development since prior examination. Index right hilar lymph node measures 2.3 cm at axial image # 70 and left hilar lymph node measures 1.5 cm at axial image # 74. The visualized thyroid is unremarkable. The esophagus is unremarkable. Lungs/Pleura: Severe centrilobular and paraseptal emphysema, more severe within the apices bilaterally. Since the prior examination, there has developed extensive bibasilar ground-glass pulmonary infiltrate and pulmonary consolidation, more severe within the right lower lobe. Small right pleural effusion has developed. Together, the findings are suspicious for atypical infection, aspiration, or less commonly, drug reaction. Small probable associated right parapneumonic effusion. No pneumothorax. The central airways are widely patent. Upper Abdomen: No acute abnormality. Musculoskeletal: No acute bone abnormality. No suspicious lytic or blastic bone lesion. Review of the MIP images confirms the above findings. IMPRESSION: No pulmonary embolism.  Severe centrilobular emphysema. Interval development of extensive bibasilar pulmonary infiltrate and consolidation most in keeping with atypical infection in the acute setting with small associated right parapneumonic effusion. Less commonly, this may be seen with aspiration or drug reaction. Extensive mediastinal adenopathy may be reactive in nature given its relatively rapid development. Follow-up imaging in 3 months, once the patient acute issues have resolved, would be helpful in documenting resolution. Extensive multi-vessel coronary artery calcification. Mild cardiomegaly with particular enlargement of the right ventricle. Dilation of the thoracic aorta with maximal dimension of 4.2 cm within the ascending segment. Recommend annual imaging followup by CTA or MRA. This recommendation follows 2010 ACCF/AHA/AATS/ACR/ASA/SCA/SCAI/SIR/STS/SVM Guidelines for the Diagnosis and Management of Patients with Thoracic Aortic Disease. Circulation. 2010; 121:: [id number] Aortic aneurysm NOS (ICD10-I71.9) Aortic aneurysm NOS (ICD10-I71.9). Aortic Atherosclerosis (ICD10-I70.0) and Emphysema (ICD10-J43.9). Electronically Signed   By: AFidela SalisburyMD   On: 11/28/2020 23:19   DG Chest Port 1 View  Result Date: 11/28/2020 CLINICAL DATA:  Chronic oxygen dependent patient presenting with a 1 week history of progressively worsening shortness of breath and hypoxemia (oxygen saturation in the 70s and 80s). Current history of UIP/pulmonary fibrosis. EXAM: PORTABLE  CHEST 1 VIEW COMPARISON:  Chest x-ray 07/15/2020. High-resolution CT chest 09/26/2020. FINDINGS: Cardiac silhouette upper normal in size for AP portable technique. Interstitial opacities throughout both lungs which are not felt to be significantly changed since the high-resolution CT. More confluent opacity peripherally in the mid RIGHT lung was shown on the CT to represent scarring. IMPRESSION: Chronic interstitial lung disease/pulmonary fibrosis. No convincing evidence  of acute cardiopulmonary disease. Electronically Signed   By: Evangeline Dakin M.D.   On: 11/28/2020 21:55   DG Swallowing Func-Speech Pathology  Result Date: 11/30/2020 Objective Swallowing Evaluation: Type of Study: MBS-Modified Barium Swallow Study  Patient Details Name: MADDIX HEINZ MRN: [id number] Date of Birth: 05/16/1946 Today's Date: 11/30/2020 Time: SLP Start Time (ACUTE ONLY): 1045 -SLP Stop Time (ACUTE ONLY): 1115 SLP Time Calculation (min) (ACUTE ONLY): 30 min Past Medical History: Past Medical History: Diagnosis Date . Adenomatous colon polyp  . Cataract  . Diabetes mellitus  . Emphysema  . Emphysema of lung ([place])  . Erectile dysfunction  . Hyperlipidemia  . Hypertension  . Peripheral vascular disease ([place])  . Pneumonia  . Pulmonary nodule  Past Surgical History: Past Surgical History: Procedure Laterality Date . ABDOMINAL AORTIC ANEURYSM REPAIR  2004 . COLONOSCOPY  multiple since 2004 HPI: WELLS MABE is a 75 y.o. 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 CT Chest shows "Severe centrilobular and paraseptal emphysema, more  severe within the apices bilaterally. Since the prior examination,  there has developed extensive bibasilar ground-glass pulmonary  infiltrate and pulmonary consolidation, more severe within the right  lower lobe. Small right pleural effusion has developed. 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 On 06-03-20 Dr. Nicolette Bang performed rt parotidectomy with facial nerve dissection which ultimately req'd  sacrifice of upper division of rt facial nerve. Rt selective neck dissection also that date.  Pt had three sessions of OP SLP therapy due to facial nerve paresis. Per ENT "He also recieved postoperative radiation therapy but did not f/u with SLP therapy after that.  Since that time he is always had some hearing problems and wears hearing aids but his hearing in his right ear has been worse.  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  No data recorded Assessment / Plan / Recommendation CHL IP CLINICAL IMPRESSIONS 11/30/2020 Clinical Impression Pt demonstrates a mild to moderate dysphagia with trace to mild sensed aspiration events that now pt reports are new over the past few days.  Oral deficits include anterior loss during chin tuck, base of tongue weakness, likely on the right leading to mild premature spillage during bolus formation with trace penetration events before the swallow and one instance of significant aspiration with hard cough. Pt also has decreased laryngeal closure during the swallow though epiglottic deflection and laryngal elevation with glottic clousre are quite complete and sustained in many attempts. Suspect decreased seal of arytenoid tissue leading to penetration during the swallow and eventual trace aspiration as glottic closure is released. Pt typically  senses this with a throat clear or cough, though not all aspirate is cleared. Nectar thick liquids and puree are tolerated without aspiration and a cue for an effortful swallow eliminates mild vallecular residue that was otherwise present. Generally, this dysphagia is mild and possibly temporary if pt can improve conditioning and general strength and pulmonary hygiene. Recommend puree (pts baseline as he does not masticate solids since surgery)and nectar thick liquids during acute phase of rehabilitation. Will benefit from RMST, training for preventative cough strategy and  strengthning for increased laryngeal closure as well as preventative oral hygiene measures. SLP Visit Diagnosis Dysphagia, oropharyngeal phase (R13.12) Attention and concentration deficit following -- Frontal lobe and executive function deficit following -- Impact on safety and function Moderate aspiration risk;Risk for inadequate nutrition/hydration   CHL IP TREATMENT RECOMMENDATION 11/30/2020 Treatment Recommendations Therapy as outlined in treatment plan below   Prognosis 11/30/2020 Prognosis for Safe Diet Advancement Good Barriers to Reach Goals -- Barriers/Prognosis Comment -- CHL IP DIET RECOMMENDATION 11/30/2020 SLP Diet Recommendations Dysphagia 1 (Puree) solids;Nectar thick liquid Liquid Administration via Straw Medication Administration Whole meds with liquid Compensations Slow rate;Small sips/bites;Effortful swallow;Clear throat after each swallow Postural Changes --   No flowsheet data found.  CHL IP FOLLOW UP RECOMMENDATIONS 11/30/2020 Follow up Recommendations Inpatient Rehab   CHL IP FREQUENCY AND DURATION 11/30/2020 Speech Therapy Frequency (ACUTE ONLY) min 2x/week Treatment Duration 2 weeks      CHL IP ORAL PHASE 11/30/2020 Oral Phase Impaired Oral - Pudding Teaspoon -- Oral - Pudding Cup -- Oral - Honey Teaspoon -- Oral - Honey Cup -- Oral - Nectar Teaspoon -- Oral - Nectar Cup -- Oral - Nectar Straw Premature spillage Oral - Thin Teaspoon -- Oral - Thin Cup -- Oral - Thin Straw Premature spillage;Right anterior bolus loss Oral - Puree WFL Oral - Mech Soft -- Oral - Regular -- Oral - Multi-Consistency -- Oral - Pill -- Oral Phase - Comment --  CHL IP PHARYNGEAL PHASE 11/30/2020 Pharyngeal Phase Impaired Pharyngeal- Pudding Teaspoon -- Pharyngeal -- Pharyngeal- Pudding Cup -- Pharyngeal -- Pharyngeal- Honey Teaspoon -- Pharyngeal -- Pharyngeal- Honey Cup -- Pharyngeal -- Pharyngeal- Nectar Teaspoon -- Pharyngeal -- Pharyngeal- Nectar Cup -- Pharyngeal -- Pharyngeal- Nectar Straw Reduced epiglottic  inversion;Reduced tongue base retraction;Pharyngeal residue - valleculae Pharyngeal -- Pharyngeal- Thin Teaspoon -- Pharyngeal -- Pharyngeal- Thin Cup -- Pharyngeal -- Pharyngeal- Thin Straw Trace aspiration;Penetration/Aspiration before swallow;Penetration/Aspiration during swallow;Reduced airway/laryngeal closure;Reduced epiglottic inversion Pharyngeal -- Pharyngeal- Puree Pharyngeal residue - valleculae Pharyngeal -- Pharyngeal- Mechanical Soft -- Pharyngeal -- Pharyngeal- Regular -- Pharyngeal -- Pharyngeal- Multi-consistency -- Pharyngeal -- Pharyngeal- Pill -- Pharyngeal -- Pharyngeal Comment --  No flowsheet data found. De[place], Katherene Ponto 11/30/2020, 11:55 AM              ECHOCARDIOGRAM COMPLETE  Result Date: 11/29/2020    ECHOCARDIOGRAM REPORT   Patient Name:   ALIE HARDGROVE Date of Exam: 11/29/2020 Medical Rec #:  [id number]       Height:       72.0 in Accession #:    [id number]      Weight:       143.8 lb Date of Birth:  1945-12-26        BSA:          1.852 m Patient Age:    72 years        BP:           94/54 mmHg Patient Gender: M  HR:           65 bpm. Exam Location:  Inpatient Procedure: 2D Echo, Cardiac Doppler and Color Doppler Indications:    COPD  History:        Patient has prior history of Echocardiogram examinations, most                 recent 08/26/2017. Risk Factors:Dyslipidemia, Hypertension and                 Diabetes.  Sonographer:    Luisa Hart RDCS Referring Phys: [place]  1. Left ventricular ejection fraction, by estimation, is 65 to 70%. The left ventricle has normal function. The left ventricle has no regional wall motion abnormalities. Left ventricular diastolic parameters were normal.  2. Right ventricular systolic function is low normal. The right ventricular size is moderately enlarged. There is mildly elevated pulmonary artery systolic pressure.  3. Left atrial size was mild to moderately dilated.  4. Right atrial size was mild  to moderately dilated.  5. The mitral valve is grossly normal. Trivial mitral valve regurgitation.  6. The aortic valve is tricuspid. There is mild calcification of the aortic valve. There is mild thickening of the aortic valve. Aortic valve regurgitation is not visualized. Mild aortic valve sclerosis is present, with no evidence of aortic valve stenosis.  7. The inferior vena cava is dilated in size with <50% respiratory variability, suggesting right atrial pressure of 15 mmHg. Comparison(s): Prior images unable to be directly viewed, comparison made by report only. Conclusion(s)/Recommendation(s): RV moderately enlarged, with mildly elevated RVSP. Normal LVEF. FINDINGS  Left Ventricle: Left ventricular ejection fraction, by estimation, is 65 to 70%. The left ventricle has normal function. The left ventricle has no regional wall motion abnormalities. The left ventricular internal cavity size was normal in size. There is  borderline left ventricular hypertrophy. Left ventricular diastolic parameters were normal. Right Ventricle: The right ventricular size is moderately enlarged. Right vetricular wall thickness was not well visualized. Right ventricular systolic function is low normal. There is mildly elevated pulmonary artery systolic pressure. The tricuspid regurgitant velocity is 2.57 m/s, and with an assumed right atrial pressure of 15 mmHg, the estimated right ventricular systolic pressure is 60.6 mmHg. Left Atrium: Left atrial size was mild to moderately dilated. Right Atrium: Right atrial size was mild to moderately dilated. Pericardium: There is no evidence of pericardial effusion. Mitral Valve: The mitral valve is grossly normal. Trivial mitral valve regurgitation. Tricuspid Valve: The tricuspid valve is normal in structure. Tricuspid valve regurgitation is mild. Aortic Valve: The aortic valve is tricuspid. There is mild calcification of the aortic valve. There is mild thickening of the aortic valve. Aortic  valve regurgitation is not visualized. Mild aortic valve sclerosis is present, with no evidence of aortic valve stenosis. Aortic valve mean gradient measures 6.0 mmHg. Aortic valve peak gradient measures 11.8 mmHg. Aortic valve area, by VTI measures 2.51 cm. Pulmonic Valve: The pulmonic valve was grossly normal. Pulmonic valve regurgitation is not visualized. No evidence of pulmonic stenosis. Aorta: The aortic root and ascending aorta are structurally normal, with no evidence of dilitation. Venous: The inferior vena cava is dilated in size with less than 50% respiratory variability, suggesting right atrial pressure of 15 mmHg. IAS/Shunts: The atrial septum is grossly normal.  LEFT VENTRICLE PLAX 2D LVIDd:         4.66 cm     Diastology LVIDs:         2.88 cm  LV e' medial:    7.72 cm/s LV PW:         1.20 cm     LV E/e' medial:  6.0 LV IVS:        0.70 cm     LV e' lateral:   10.40 cm/s LVOT diam:     2.60 cm     LV E/e' lateral: 4.5 LV SV:         100 LV SV Index:   54 LVOT Area:     5.31 cm  LV Volumes (MOD) LV vol d, MOD A2C: 85.5 ml LV vol s, MOD A2C: 28.9 ml LV SV MOD A2C:     56.6 ml RIGHT VENTRICLE RV S prime:     10.10 cm/s  PULMONARY VEINS TAPSE (M-mode): 1.6 cm      A Reversal Duration: 77.00 msec                             A Reversal Velocity: 16.80 cm/s                             Diastolic Velocity:  61.44 cm/s                             S/D Velocity:        1.40                             Systolic Velocity:   31.54 cm/s AORTIC VALVE                    PULMONIC VALVE AV Area (Vmax):    2.58 cm     PV Vmax:       0.94 m/s AV Area (Vmean):   2.81 cm     PV Vmean:      79.900 cm/s AV Area (VTI):     2.51 cm     PV VTI:        0.263 m AV Vmax:           172.00 cm/s  PV Peak grad:  3.5 mmHg AV Vmean:          113.000 cm/s PV Mean grad:  3.0 mmHg AV VTI:            0.400 m AV Peak Grad:      11.8 mmHg AV Mean Grad:      6.0 mmHg LVOT Vmax:         83.50 cm/s LVOT Vmean:        59.900 cm/s LVOT VTI:           0.189 m LVOT/AV VTI ratio: 0.47  AORTA Ao Root diam: 4.00 cm Ao Asc diam:  3.70 cm MITRAL VALVE               TRICUSPID VALVE MV Area (PHT): 2.50 cm    TR Peak grad:   26.4 mmHg MV Decel Time: 303 msec    TR Vmax:        257.00 cm/s MV E velocity: 46.70 cm/s MV A velocity: 83.90 cm/s  SHUNTS MV E/A ratio:  0.56        Systemic VTI:  0.19 m  Systemic Diam: 2.60 cm Buford Dresser MD Electronically signed by Buford Dresser MD Signature Date/Time: 11/29/2020/6:11:02 PM    Final     ASSESSMENT & PLAN:   75 yo with pancytopenia in the context of being treated for cancer with surgery and radiation.  1. Anemia - multifactorial.  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 Thrombocytopenia  3. Leucopenia - primarily lymphopenia -- likely related to Radiation therapy.  3. Right Parotid Carcinoma: presented to Dr. Lucia Gaskins on 05/02/2020 with a developing right-sided parotid mass.  This was in the setting of a previous history of skin cancer on the right side of his scalp that was removed surgically 3 years ago.  He also had a history of a T1 tongue cancer, left side, that was removed surgically 9 years ago. Fine needle aspiration was performed at that time of the right parotid and revealed malignant cells consistent with non-small cell carcinoma.   -s/p surgery with subsequent flap closure. Right parotidectomy on 06/03/2020 revealed poorly differentiated carcinoma that measured up to 6 cm in greatest dimension. Lymphovascular invasion was present.  No mention of positive or negative perineural invasion.  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 Peripheral and deep linked margin of the specimen was positive for  tumor. Right level 2A lymph nodes (14), right level 2B lymph nodes (9), and tissue in glenoid fossa were all negative for malignancy.   -s/p Radiation therapy for positive margins  -Will defer management to ENT and RadOnc   4.  Moderate protein calorie malnutrition -being followed by nutritional therapy.  Weight is starting to stabilize. . Wt Readings from Last 3 Encounters:  12/24/20 139 lb 14.4 oz (63.5 kg)  12/11/20 137 lb (62.1 kg)  11/26/20 143 lb 12 oz (65.2 kg)     PLAN: -Discussed pt labwork today, 12/24/2020; WBC normal, Plt low, Hgb lower. Immature Plt Fract still stable elevated. Blood sugars very elevated. Other labs pending. -Advised pt the next step for a complete workup would be to get a bone marrow biopsy.  -Advised pt we are unsure if he is a candidate for MDS treatments. This is what we are trying to rule out with the bone marrow. If this were low-grade MDS, we would support this with shots to stimulate his bone marrow. -Discussed goals of care and next steps. The pt wishes to continue with bone marrow biopsy to continue palliative measures. -Continue to f/u w PCP regarding management of insulin and uncontrolled blood sugars. -Continue daily Vitamin B12, B-Complex. -Will get Bm Bx in 1 week. -Will see back in 2 weeks with labs.   FOLLOW UP: CT bone marrow aspiration and biopsy in 5-7 days RTC with Dr Irene Limbo in about 2 weeks    The total time spent in the appointment was 40 minutes and more than 50% was on counseling and direct patient cares.   Sullivan Lone MD [place] AAHIVMS [facility] Hematology/Oncology Physician [facility]  (Office):       [phone] (Work cell):  [phone] (Fax):           [phone]  12/24/2020 3:46 PM  I, Reinaldo Raddle, am acting as scribe for Dr. Sullivan Lone, MD.  .I have reviewed the above documentation for accuracy and completeness, and  I agree with the above. Brunetta Genera MD

## 2020-12-24 ENCOUNTER — Inpatient Hospital Stay: Payer: Medicare Other

## 2020-12-24 ENCOUNTER — Other Ambulatory Visit: Payer: Self-pay

## 2020-12-24 ENCOUNTER — Inpatient Hospital Stay (HOSPITAL_BASED_OUTPATIENT_CLINIC_OR_DEPARTMENT_OTHER): Payer: Medicare Other | Admitting: Hematology

## 2020-12-24 VITALS — BP 103/63 | HR 103 | Temp 97.3°F | Resp 20 | Ht 72.0 in | Wt 139.9 lb

## 2020-12-24 DIAGNOSIS — D696 Thrombocytopenia, unspecified: Secondary | ICD-10-CM

## 2020-12-24 DIAGNOSIS — R0602 Shortness of breath: Secondary | ICD-10-CM | POA: Diagnosis not present

## 2020-12-24 DIAGNOSIS — D649 Anemia, unspecified: Secondary | ICD-10-CM

## 2020-12-24 DIAGNOSIS — I2109 ST elevation (STEMI) myocardial infarction involving other coronary artery of anterior wall: Secondary | ICD-10-CM | POA: Diagnosis not present

## 2020-12-24 DIAGNOSIS — D693 Immune thrombocytopenic purpura: Secondary | ICD-10-CM

## 2020-12-24 LAB — CBC WITH DIFFERENTIAL/PLATELET
Abs Immature Granulocytes: 0.25 10*3/uL — ABNORMAL HIGH (ref 0.00–0.07)
Basophils Absolute: 0.1 10*3/uL (ref 0.0–0.1)
Basophils Relative: 1 %
Eosinophils Absolute: 0 10*3/uL (ref 0.0–0.5)
Eosinophils Relative: 1 %
HCT: 29.1 % — ABNORMAL LOW (ref 39.0–52.0)
Hemoglobin: 9.1 g/dL — ABNORMAL LOW (ref 13.0–17.0)
Immature Granulocytes: 6 %
Lymphocytes Relative: 5 %
Lymphs Abs: 0.2 10*3/uL — ABNORMAL LOW (ref 0.7–4.0)
MCH: 25.3 pg — ABNORMAL LOW (ref 26.0–34.0)
MCHC: 31.3 g/dL (ref 30.0–36.0)
MCV: 81.1 fL (ref 80.0–100.0)
Monocytes Absolute: 0.6 10*3/uL (ref 0.1–1.0)
Monocytes Relative: 13 %
Neutro Abs: 3.3 10*3/uL (ref 1.7–7.7)
Neutrophils Relative %: 74 %
Platelets: 48 10*3/uL — ABNORMAL LOW (ref 150–400)
RBC: 3.59 MIL/uL — ABNORMAL LOW (ref 4.22–5.81)
RDW: 25.9 % — ABNORMAL HIGH (ref 11.5–15.5)
WBC: 4.4 10*3/uL (ref 4.0–10.5)
nRBC: 12.1 % — ABNORMAL HIGH (ref 0.0–0.2)

## 2020-12-24 LAB — CMP (CANCER CENTER ONLY)
ALT: 11 U/L (ref 0–44)
AST: 14 U/L — ABNORMAL LOW (ref 15–41)
Albumin: 2.7 g/dL — ABNORMAL LOW (ref 3.5–5.0)
Alkaline Phosphatase: 84 U/L (ref 38–126)
Anion gap: 8 (ref 5–15)
BUN: 33 mg/dL — ABNORMAL HIGH (ref 8–23)
CO2: 28 mmol/L (ref 22–32)
Calcium: 9.2 mg/dL (ref 8.9–10.3)
Chloride: 94 mmol/L — ABNORMAL LOW (ref 98–111)
Creatinine: 0.99 mg/dL (ref 0.61–1.24)
GFR, Estimated: >60 mL/min (ref >60)
Glucose, Bld: 352 mg/dL — ABNORMAL HIGH (ref 70–99)
Potassium: 5.2 mmol/L — ABNORMAL HIGH (ref 3.5–5.1)
Sodium: 130 mmol/L — ABNORMAL LOW (ref 135–145)
Total Bilirubin: 1.7 mg/dL — ABNORMAL HIGH (ref 0.3–1.2)
Total Protein: 7.2 g/dL (ref 6.5–8.1)

## 2020-12-24 LAB — IRON AND TIBC
Iron: 61 ug/dL (ref 42–163)
Saturation Ratios: 34 % (ref 20–55)
TIBC: 179 ug/dL — ABNORMAL LOW (ref 202–409)
UIBC: 118 ug/dL (ref 117–376)

## 2020-12-24 LAB — IMMATURE PLATELET FRACTION: Immature Platelet Fraction: 44.9 % — ABNORMAL HIGH (ref 1.2–8.6)

## 2020-12-24 LAB — FERRITIN: Ferritin: 1110 ng/mL — ABNORMAL HIGH (ref 24–336)

## 2020-12-25 ENCOUNTER — Telehealth: Payer: Self-pay | Admitting: Hematology

## 2020-12-25 NOTE — Telephone Encounter (Signed)
Scheduled follow-up appointment per 5/10 los. Patient's wife is aware.

## 2020-12-26 ENCOUNTER — Ambulatory Visit (INDEPENDENT_AMBULATORY_CARE_PROVIDER_SITE_OTHER): Payer: Medicare Other | Admitting: Otolaryngology

## 2020-12-26 ENCOUNTER — Emergency Department (HOSPITAL_COMMUNITY): Payer: Medicare Other

## 2020-12-26 ENCOUNTER — Other Ambulatory Visit: Payer: Self-pay

## 2020-12-26 ENCOUNTER — Encounter: Payer: Self-pay | Admitting: Hematology

## 2020-12-26 DIAGNOSIS — F419 Anxiety disorder, unspecified: Secondary | ICD-10-CM | POA: Diagnosis present

## 2020-12-26 DIAGNOSIS — Z79899 Other long term (current) drug therapy: Secondary | ICD-10-CM

## 2020-12-26 DIAGNOSIS — Z85818 Personal history of malignant neoplasm of other sites of lip, oral cavity, and pharynx: Secondary | ICD-10-CM

## 2020-12-26 DIAGNOSIS — R29810 Facial weakness: Secondary | ICD-10-CM | POA: Diagnosis present

## 2020-12-26 DIAGNOSIS — K761 Chronic passive congestion of liver: Secondary | ICD-10-CM | POA: Diagnosis present

## 2020-12-26 DIAGNOSIS — N179 Acute kidney failure, unspecified: Secondary | ICD-10-CM | POA: Diagnosis present

## 2020-12-26 DIAGNOSIS — G9341 Metabolic encephalopathy: Secondary | ICD-10-CM | POA: Diagnosis present

## 2020-12-26 DIAGNOSIS — L8990 Pressure ulcer of unspecified site, unspecified stage: Secondary | ICD-10-CM | POA: Insufficient documentation

## 2020-12-26 DIAGNOSIS — E872 Acidosis: Secondary | ICD-10-CM | POA: Diagnosis present

## 2020-12-26 DIAGNOSIS — Z7952 Long term (current) use of systemic steroids: Secondary | ICD-10-CM

## 2020-12-26 DIAGNOSIS — E785 Hyperlipidemia, unspecified: Secondary | ICD-10-CM | POA: Diagnosis present

## 2020-12-26 DIAGNOSIS — E1151 Type 2 diabetes mellitus with diabetic peripheral angiopathy without gangrene: Secondary | ICD-10-CM | POA: Diagnosis present

## 2020-12-26 DIAGNOSIS — E1165 Type 2 diabetes mellitus with hyperglycemia: Secondary | ICD-10-CM | POA: Diagnosis present

## 2020-12-26 DIAGNOSIS — R570 Cardiogenic shock: Secondary | ICD-10-CM | POA: Diagnosis present

## 2020-12-26 DIAGNOSIS — J9621 Acute and chronic respiratory failure with hypoxia: Secondary | ICD-10-CM | POA: Diagnosis present

## 2020-12-26 DIAGNOSIS — R54 Age-related physical debility: Secondary | ICD-10-CM | POA: Diagnosis present

## 2020-12-26 DIAGNOSIS — J438 Other emphysema: Secondary | ICD-10-CM | POA: Diagnosis present

## 2020-12-26 DIAGNOSIS — E875 Hyperkalemia: Secondary | ICD-10-CM | POA: Diagnosis present

## 2020-12-26 DIAGNOSIS — I110 Hypertensive heart disease with heart failure: Secondary | ICD-10-CM | POA: Diagnosis present

## 2020-12-26 DIAGNOSIS — Z66 Do not resuscitate: Secondary | ICD-10-CM | POA: Diagnosis present

## 2020-12-26 DIAGNOSIS — R627 Adult failure to thrive: Secondary | ICD-10-CM | POA: Diagnosis present

## 2020-12-26 DIAGNOSIS — Z20822 Contact with and (suspected) exposure to covid-19: Secondary | ICD-10-CM | POA: Diagnosis present

## 2020-12-26 DIAGNOSIS — R5381 Other malaise: Secondary | ICD-10-CM | POA: Diagnosis present

## 2020-12-26 DIAGNOSIS — Z87891 Personal history of nicotine dependence: Secondary | ICD-10-CM

## 2020-12-26 DIAGNOSIS — R579 Shock, unspecified: Secondary | ICD-10-CM

## 2020-12-26 DIAGNOSIS — Z85828 Personal history of other malignant neoplasm of skin: Secondary | ICD-10-CM

## 2020-12-26 DIAGNOSIS — I50810 Right heart failure, unspecified: Secondary | ICD-10-CM | POA: Diagnosis present

## 2020-12-26 DIAGNOSIS — Z794 Long term (current) use of insulin: Secondary | ICD-10-CM

## 2020-12-26 DIAGNOSIS — I2109 ST elevation (STEMI) myocardial infarction involving other coronary artery of anterior wall: Principal | ICD-10-CM | POA: Diagnosis present

## 2020-12-26 DIAGNOSIS — Z7951 Long term (current) use of inhaled steroids: Secondary | ICD-10-CM

## 2020-12-26 DIAGNOSIS — Z9981 Dependence on supplemental oxygen: Secondary | ICD-10-CM

## 2020-12-26 DIAGNOSIS — J8410 Pulmonary fibrosis, unspecified: Secondary | ICD-10-CM | POA: Diagnosis present

## 2020-12-26 DIAGNOSIS — Z515 Encounter for palliative care: Secondary | ICD-10-CM

## 2020-12-26 LAB — CBC WITH DIFFERENTIAL/PLATELET
Abs Immature Granulocytes: 1.27 10*3/uL — ABNORMAL HIGH (ref 0.00–0.07)
Basophils Absolute: 0.2 10*3/uL — ABNORMAL HIGH (ref 0.0–0.1)
Basophils Relative: 2 %
Eosinophils Absolute: 0 10*3/uL (ref 0.0–0.5)
Eosinophils Relative: 0 %
HCT: 32.4 % — ABNORMAL LOW (ref 39.0–52.0)
Hemoglobin: 9.1 g/dL — ABNORMAL LOW (ref 13.0–17.0)
Immature Granulocytes: 9 %
Lymphocytes Relative: 12 %
Lymphs Abs: 1.8 10*3/uL (ref 0.7–4.0)
MCH: 25.5 pg — ABNORMAL LOW (ref 26.0–34.0)
MCHC: 28.1 g/dL — ABNORMAL LOW (ref 30.0–36.0)
MCV: 90.8 fL (ref 80.0–100.0)
Monocytes Absolute: 1.8 10*3/uL — ABNORMAL HIGH (ref 0.1–1.0)
Monocytes Relative: 13 %
Neutro Abs: 9.6 10*3/uL — ABNORMAL HIGH (ref 1.7–7.7)
Neutrophils Relative %: 64 %
Platelets: 62 10*3/uL — ABNORMAL LOW (ref 150–400)
RBC: 3.57 MIL/uL — ABNORMAL LOW (ref 4.22–5.81)
RDW: 27.9 % — ABNORMAL HIGH (ref 11.5–15.5)
WBC: 14.7 10*3/uL — ABNORMAL HIGH (ref 4.0–10.5)
nRBC: 15.2 % — ABNORMAL HIGH (ref 0.0–0.2)

## 2020-12-26 LAB — COMPREHENSIVE METABOLIC PANEL
ALT: 367 U/L — ABNORMAL HIGH (ref 0–44)
AST: 469 U/L — ABNORMAL HIGH (ref 15–41)
Albumin: 2.6 g/dL — ABNORMAL LOW (ref 3.5–5.0)
Alkaline Phosphatase: 82 U/L (ref 38–126)
Anion gap: 24 — ABNORMAL HIGH (ref 5–15)
BUN: 55 mg/dL — ABNORMAL HIGH (ref 8–23)
CO2: 15 mmol/L — ABNORMAL LOW (ref 22–32)
Calcium: 9.1 mg/dL (ref 8.9–10.3)
Chloride: 92 mmol/L — ABNORMAL LOW (ref 98–111)
Creatinine, Ser: 1.87 mg/dL — ABNORMAL HIGH (ref 0.61–1.24)
GFR, Estimated: 37 mL/min — ABNORMAL LOW (ref >60)
Glucose, Bld: 246 mg/dL — ABNORMAL HIGH (ref 70–99)
Potassium: 7 mmol/L (ref 3.5–5.1)
Sodium: 131 mmol/L — ABNORMAL LOW (ref 135–145)
Total Bilirubin: 5.1 mg/dL — ABNORMAL HIGH (ref 0.3–1.2)
Total Protein: 6.9 g/dL (ref 6.5–8.1)

## 2020-12-26 LAB — RESP PANEL BY RT-PCR (FLU A&B, COVID) ARPGX2
Influenza A by PCR: NEGATIVE
Influenza B by PCR: NEGATIVE
SARS Coronavirus 2 by RT PCR: NEGATIVE

## 2020-12-26 LAB — CBG MONITORING, ED: Glucose-Capillary: 244 mg/dL — ABNORMAL HIGH (ref 70–99)

## 2020-12-26 LAB — TROPONIN I (HIGH SENSITIVITY): Troponin I (High Sensitivity): 7950 ng/L (ref <18)

## 2020-12-26 LAB — BRAIN NATRIURETIC PEPTIDE: B Natriuretic Peptide: 2467.2 pg/mL — ABNORMAL HIGH (ref 0.0–100.0)

## 2020-12-26 MED ORDER — METRONIDAZOLE 500 MG/100ML IV SOLN
500.0000 mg | Freq: Once | INTRAVENOUS | Status: AC
  Administered 2020-12-27: 500 mg via INTRAVENOUS
  Filled 2020-12-26: qty 100

## 2020-12-26 MED ORDER — VANCOMYCIN HCL 1250 MG/250ML IV SOLN
1250.0000 mg | Freq: Once | INTRAVENOUS | Status: AC
  Administered 2020-12-27: 1250 mg via INTRAVENOUS
  Filled 2020-12-26: qty 250

## 2020-12-26 MED ORDER — SODIUM ZIRCONIUM CYCLOSILICATE 10 G PO PACK
10.0000 g | PACK | Freq: Once | ORAL | Status: AC
  Administered 2020-12-26: 10 g via ORAL
  Filled 2020-12-26: qty 1

## 2020-12-26 MED ORDER — SODIUM CHLORIDE 0.9 % IV BOLUS
500.0000 mL | Freq: Once | INTRAVENOUS | Status: AC
Start: 2020-12-26 — End: 2020-12-26
  Administered 2020-12-26: 500 mL via INTRAVENOUS

## 2020-12-26 MED ORDER — INSULIN ASPART 100 UNIT/ML IV SOLN
10.0000 [IU] | Freq: Once | INTRAVENOUS | Status: AC
  Administered 2020-12-26: 10 [IU] via INTRAVENOUS

## 2020-12-26 MED ORDER — SODIUM CHLORIDE 0.9 % IV SOLN
2.0000 g | Freq: Once | INTRAVENOUS | Status: AC
  Administered 2020-12-26: 2 g via INTRAVENOUS
  Filled 2020-12-26: qty 2

## 2020-12-26 MED ORDER — SODIUM CHLORIDE 0.9 % IV BOLUS: 1000.0000 mL | Freq: Once | INTRAVENOUS | Status: DC

## 2020-12-26 MED ORDER — SODIUM BICARBONATE 8.4 % IV SOLN
50.0000 meq | Freq: Once | INTRAVENOUS | Status: AC
  Administered 2020-12-26: 50 meq via INTRAVENOUS
  Filled 2020-12-26: qty 50

## 2020-12-26 MED ORDER — CALCIUM GLUCONATE 10 % IV SOLN
1.0000 g | Freq: Once | INTRAVENOUS | Status: AC
  Administered 2020-12-26: 1 g via INTRAVENOUS
  Filled 2020-12-26: qty 10

## 2020-12-26 MED ORDER — DEXTROSE 50 % IV SOLN
1.0000 | Freq: Once | INTRAVENOUS | Status: AC
  Administered 2020-12-26: 50 mL via INTRAVENOUS
  Filled 2020-12-26: qty 50

## 2020-12-26 MED ORDER — ALBUTEROL (5 MG/ML) CONTINUOUS INHALATION SOLN
10.0000 mg/h | INHALATION_SOLUTION | Freq: Once | RESPIRATORY_TRACT | Status: AC
  Administered 2020-12-26: 10 mg/h via RESPIRATORY_TRACT
  Filled 2020-12-26: qty 20

## 2020-12-26 MED ORDER — METHYLPREDNISOLONE SODIUM SUCC 125 MG IJ SOLR
125.0000 mg | Freq: Once | INTRAMUSCULAR | Status: AC
  Administered 2020-12-26: 125 mg via INTRAVENOUS
  Filled 2020-12-26: qty 2

## 2020-12-26 NOTE — ED Provider Notes (Signed)
Emergency Department Provider Note   I have reviewed the triage vital signs and the nursing notes.   HISTORY  Chief Complaint Shortness of Breath   HPI Geoffrey Wood is a 75 y.o. male with past medical history of pulmonary fibrosis, severe emphysema currently on 4 L home O2 presents to the emergency department with sudden shortness of breath in the setting of chronic dyspnea at home.  He has been using his oxygen and home medications.  He lives at home with his wife.  She called EMS with trouble breathing which seemed suddenly much worse.  He denies any chest pain, fevers, chills.  He is not having abdominal or back pain.  EMS arrived to find him tachypneic and hypoxemic.  They transitioned him to CPAP in route and he seemed to do much better on that.   Level 5 caveat: Respiratory distress.   CODE STATUS: FULL CODE  Past Medical History:  Diagnosis Date  . Adenomatous colon polyp   . Cataract   . Diabetes mellitus   . Emphysema   . Emphysema of lung ([place])   . Erectile dysfunction   . Hyperlipidemia   . Hypertension   . Peripheral vascular disease ([place])   . Pneumonia   . Pulmonary nodule     Patient Active Problem List   Diagnosis Date Noted  . Bilateral lower extremity edema 12/11/2020  . Chronic kidney disease due to hypertension 12/11/2020  . Hyperbilirubinemia 12/11/2020  . Hypoxia 12/11/2020  . Idiopathic hypotension 12/11/2020  . Secondary malignant neoplasm of other specified sites ([place]) 12/11/2020  . Shortness of breath 12/11/2020  . Squamous cell carcinoma of skin of face 12/11/2020  . Thrombocytopenia ([place]) 12/11/2020  . Tobacco use disorder 12/11/2020  . Chronic respiratory failure with hypoxia ([place]) 12/11/2020  . Dysphagia 12/11/2020  . Protein-calorie malnutrition, severe 11/30/2020  . COPD exacerbation ([place]) 11/29/2020  . CAP (community acquired pneumonia) 11/29/2020  . Cellulitis 11/29/2020  . Anemia 11/12/2020  . Iron deficiency anemia  11/12/2020  . IPF (idiopathic pulmonary fibrosis) ([place]) 09/30/2020  . 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 Malignant neoplasm of parotid gland ([place]) 07/05/2020  . Malignant neoplasm of tongue ([place]) 06/25/2020  . Paralytic ectropion of right lower eyelid 06/25/2020  . Paralytic lagophthalmos of right upper eyelid 06/25/2020  . Personal history of surgery to heart and great vessels, presenting hazards to health 06/25/2020  . Abnormal weight loss 02/13/2020  . Coronary artery calcification 11/16/2018  . 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 Chest pain 12/04/2016  . Dilatation of aorta (HCC) 07/28/2016  . Diabetic renal disease ([place]) 04/10/2016  . Proteinuria 04/10/2016  . Non-thrombocytopenic purpura ([place]) 03/14/2015  . Pulmonary nodules 01/22/2014  . Lung field abnormal 09/05/2013  . ED (erectile dysfunction) of organic origin 02/23/2013  . Personal history of adenomatous colonic polyps 12/24/2011  . HTN (hypertension) 04/21/2011  . Hyperlipidemia 04/21/2011  . B12 deficiency 09/20/2009  . Calculus of kidney 09/05/2009  . Other intervertebral disc degeneration, lumbar region 09/05/2009  . Type 2 diabetes mellitus without complication ([place]) 32/44/0102  . Unspecified hearing loss, unspecified ear 09/05/2009  . Unspecified osteoarthritis, unspecified site 09/05/2009    Past Surgical History:  Procedure Laterality Date  . ABDOMINAL AORTIC ANEURYSM REPAIR  2004  . COLONOSCOPY  multiple since 2004    Allergies Patient has no known allergies.  Family History  Problem Relation Age of Onset  . Heart failure Mother   . Diabetes Mother   . Cancer Father  lung  . Colon cancer Neg Hx   . Esophageal cancer Neg Hx   . Liver cancer Neg Hx   . Pancreatic cancer Neg Hx   . Rectal cancer Neg Hx   . Stomach cancer Neg Hx     Social History Social History   Tobacco Use  . Smoking status: Former Smoker    Packs/day: 1.50    Years: 60.00    Pack  years: 90.00    Types: Cigarettes    Quit date: 09/17/2014    Years since quitting: 6.2  . Smokeless tobacco: Never Used  Vaping Use  . Vaping Use: Never used  Substance Use Topics  . Alcohol use: Yes    Comment: not even 1 drink a week  . Drug use: No    Review of Systems  Constitutional: No fever/chills Eyes: No visual changes. ENT: No sore throat. Cardiovascular: Denies chest pain. Respiratory: Positive shortness of breath. Gastrointestinal: No abdominal pain.  No nausea, no vomiting.  No diarrhea.  No constipation. Genitourinary: Negative for dysuria. Musculoskeletal: Negative for back pain. Skin: Negative for rash. Neurological: Negative for headaches, focal weakness or numbness.  10-point ROS otherwise negative.  ____________________________________________   PHYSICAL EXAM:  VITAL SIGNS: Vitals:   12/17/2020 2330 12/24/2020 2345  BP: (!) 79/52 (!) 85/57  Pulse: 93 90  Resp: (!) 31 (!) 32  Temp:    SpO2: 100% 100%   Physical Exam:   Constitutional: Alert. Making eye contact and following commands. Speaking with 1-2 word sentences on CPAP. Appears to be in distress.  Eyes: Conjunctivae are normal. Head: Atraumatic. Nose: No congestion/rhinnorhea. Mouth/Throat: Mucous membranes are moist.  Neck: No stridor Cardiovascular: Normal rate, regular rhythm. Good peripheral circulation. Grossly normal heart sounds.   Respiratory: Significantly increased respiratory effort. Crackles at the bases with mild end-expiratory wheezing.  Gastrointestinal: Soft and nontender. No distention.  Musculoskeletal: No lower extremity tenderness with 2+ pitting edema. No gross deformities of extremities. Neurologic:  Normal speech and language. No gross focal neurologic deficits are appreciated.  Skin:  Skin is warm, dry and intact. No rash noted.   ____________________________________________   LABS (all labs ordered are listed, but only abnormal results are displayed)  Labs  Reviewed  COMPREHENSIVE METABOLIC PANEL - Abnormal; Notable for the following components:      Result Value   Sodium 131 (*)    Potassium 7.0 (*)    Chloride 92 (*)    CO2 15 (*)    Glucose, Bld 246 (*)    BUN 55 (*)    Creatinine, Ser 1.87 (*)    Albumin 2.6 (*)    AST 469 (*)    ALT 367 (*)    Total Bilirubin 5.1 (*)    GFR, Estimated 37 (*)    Anion gap 24 (*)    All other components within normal limits  BRAIN NATRIURETIC PEPTIDE - Abnormal; Notable for the following components:   B Natriuretic Peptide 2,467.2 (*)    All other components within normal limits  CBC WITH DIFFERENTIAL/PLATELET - Abnormal; Notable for the following components:   WBC 14.7 (*)    RBC 3.57 (*)    Hemoglobin 9.1 (*)    HCT 32.4 (*)    MCH 25.5 (*)    MCHC 28.1 (*)    RDW 27.9 (*)    Platelets 62 (*)    nRBC 15.2 (*)    Neutro Abs 9.6 (*)    Monocytes Absolute 1.8 (*)    Basophils  Absolute 0.2 (*)    Abs Immature Granulocytes 1.27 (*)    All other components within normal limits  CBG MONITORING, ED - Abnormal; Notable for the following components:   Glucose-Capillary 244 (*)    All other components within normal limits  TROPONIN I (HIGH SENSITIVITY) - Abnormal; Notable for the following components:   Troponin I (High Sensitivity) 7,950 (*)    All other components within normal limits  RESP PANEL BY RT-PCR (FLU A&B, COVID) ARPGX2  CULTURE, BLOOD (ROUTINE X 2)  CULTURE, BLOOD (ROUTINE X 2)  PATHOLOGIST SMEAR REVIEW  BLOOD GAS, VENOUS  LACTIC ACID, PLASMA  LACTIC ACID, PLASMA  CK  BETA-HYDROXYBUTYRIC ACID  CBG MONITORING, ED  TROPONIN I (HIGH SENSITIVITY)   ____________________________________________  EKG   EKG Interpretation  Date/Time:  Thursday Dec 26 2020 21:26:05 EDT Ventricular Rate:  52 PR Interval:    QRS Duration: 116 QT Interval:  394 QTC Calculation: 367 R Axis:   -8 Text Interpretation: Atrial fibrillation Incomplete right bundle branch block Left ventricular  hypertrophy Inferior infarct, age indeterminate no significant change from prior Confirmed by Nanda Quinton [id number]) on 01/09/2021 9:34:16 PM       ____________________________________________  RADIOLOGY  DG Chest Portable 1 View  Result Date: 01/06/2021 CLINICAL DATA:  Shortness of breath EXAM: PORTABLE CHEST 1 VIEW COMPARISON:  11/28/2020 FINDINGS: Cardiac shadow is stable. Diffuse fibrotic changes are again identified throughout both lungs. No focal confluent infiltrate or effusion is seen. Bony structures are within normal limits. IMPRESSION: Chronic fibrotic changes bilaterally.  No acute abnormality noted. Electronically Signed   By: Inez Catalina M.D.   On: 01/07/2021 22:25    ____________________________________________   PROCEDURES  Procedure(s) performed:   .Critical Care Performed by: Margette Fast, MD Authorized by: Margette Fast, MD   Critical care provider statement:    Critical care time (minutes):  45   Critical care time was exclusive of:  Separately billable procedures and treating other patients and teaching time   Critical care was necessary to treat or prevent imminent or life-threatening deterioration of the following conditions:  Shock and respiratory failure   Critical care was time spent personally by me on the following activities:  Discussions with consultants, evaluation of patient's response to treatment, examination of patient, ordering and performing treatments and interventions, ordering and review of laboratory studies, ordering and review of radiographic studies, pulse oximetry, re-evaluation of patient's condition, obtaining history from patient or surrogate and review of old charts   I assumed direction of critical care for this patient from another provider in my specialty: no     Care discussed with: admitting provider       ____________________________________________   INITIAL IMPRESSION / [place] / ED COURSE  Pertinent labs &  imaging results that were available during my care of the patient were reviewed by me and considered in my medical decision making (see chart for details).   Patient arrives to the emergency department with shortness of breath which is an acute on chronic issue for the patient.  He seems to have improved on BiPAP here and with EMS.  He has some mild wheezing and so started with Solu-Medrol and continuous neb.  In discussing the case with patient's wife she notes that he is always hypotensive with baseline blood pressures in the 90s which seems consistent with prior notes.  His blood pressures here are lower in the 60s to 70s.  Despite this, he is awake, alert, conversational  although is hard of hearing.  IV fluids initiated as patient does not appear acutely volume overloaded.   The patient's EKG does not appear ischemic.  His BNP is elevated and troponin is significantly elevated at 7900.  I discussed this with the cardiology fellow on-call who will consult on the patient this evening.  Patient's chemistry is also abnormal showing AKI with potassium of 7 which is not hemolyzed.  I have started Lokelma, calcium, other potassium shifting medications.  Plan for admission to the ICU.   Discussed patient's case with ICU to request admission. Patient and family (if present) updated with plan. Care transferred to ICU service.  I reviewed all nursing notes, vitals, pertinent old records, EKGs, labs, imaging (as available).    ____________________________________________  FINAL CLINICAL IMPRESSION(S) / ED DIAGNOSES  Final diagnoses:  Acute on chronic respiratory failure with hypoxia (HCC)  Shock ([place])  Hyperkalemia     MEDICATIONS GIVEN DURING THIS VISIT:  Medications  metroNIDAZOLE (FLAGYL) IVPB 500 mg (has no administration in time range)  vancomycin (VANCOREADY) IVPB 1250 mg/250 mL (has no administration in time range)  methylPREDNISolone sodium succinate (SOLU-MEDROL) 125 mg/2 mL injection  125 mg (125 mg Intravenous Given 2020/12/28 2143)  albuterol (PROVENTIL,VENTOLIN) solution continuous neb (10 mg/hr Nebulization Given 12-28-20 2140)  sodium chloride 0.9 % bolus 500 mL (0 mLs Intravenous Stopped 12-28-20 2231)  ceFEPIme (MAXIPIME) 2 g in sodium chloride 0.9 % 100 mL IVPB (0 g Intravenous Stopped 12-28-2020 2345)  sodium chloride 0.9 % bolus 500 mL (0 mLs Intravenous Stopped 2020/12/28 2349)  calcium gluconate inj 10% (1 g) URGENT USE ONLY! (1 g Intravenous Given 2020/12/28 2305)  insulin aspart (novoLOG) injection 10 Units (10 Units Intravenous Given 28-Dec-2020 2309)  dextrose 50 % solution 50 mL (50 mLs Intravenous Given 12/28/20 2309)  sodium zirconium cyclosilicate (LOKELMA) packet 10 g (10 g Oral Given Dec 28, 2020 2316)  sodium bicarbonate injection 50 mEq (50 mEq Intravenous Given 12/28/2020 2307)     Note:  This document was prepared using Dragon voice recognition software and may include unintentional dictation errors.  Nanda Quinton, MD, [facility] Emergency Medicine    Baran Kuhrt, [facility], MD 12/27/20 0000

## 2020-12-26 NOTE — ED Triage Notes (Signed)
Brought in by [facility] - arrived on CPAP - SOB about an hour. Hx of COPD.   Hospitalized in April for COPD. Upon EMS arrival RR40cpm. Placed on CPAP with improvement. g20 left ac. g18 right ac. BP 88/60 - per wife pt is baseline hypotensive. Afib on EKG, with no history.

## 2020-12-26 NOTE — H&P (Addendum)
NAME:  Geoffrey Wood, MRN:  [id number], DOB:  26-Sep-1945, LOS: 0 ADMISSION DATE:  01-10-2021, CONSULTATION DATE:  01-10-21 REFERRING MD:  Dr. Laverta Baltimore, CHIEF COMPLAINT:  Dyspnea  Brief History   30yM with CPFE on 4L O2 at home here with acute on chronic hypoxic respiratory failure requiring BiPAP, inferolateral STEMI, AKI  History of present illness   Geoffrey Wood is 32yM with recent admission 4/14 for ILD flare/COPD exac completing slow taper of steroids who presents today to ED for evaluation of rapidly worsening dyspnea. His wife gives most of the history due to his difficulty hearing and work of breathing on BiPAP. He was recovering slowly from recent admission when he and his wife became more concerned this week for constipation or fecal impaction and they ramped up his bowel regimen aggressively including fleets enemas and he had several days of diarrhea. Today though he became abruptly more dyspneic and they called EMS who found him hypoxic and tachypneic. They started him on CPAP with improvement in his work of breathing.   In ED he was found to have acute on chronic hypoxic respiratory failure requiring BiPAP, inferolateral STEMI, he was hypotensive given 1L NS, hyperkalemic given 1 amp bicarb, lokelma in setting of AKI.   Past Medical History  Combined pulmonary fibrosis and emphysema Chronic hypoxic respiratory failure Pancytopenia Right parotid carcinoma PAD Smoking   Significant Hospital Events   n/a  Consults:  Cardiology  Procedures:  None  Significant Diagnostic Tests:  12/27/20 EKG inferolateral STEMI  01/10/2021 CXR with bilateral alveolar and interstitial opacities, similar to prior  Micro Data:  01/10/2021 BCx in process  Antimicrobials:  01/10/21 vanc- 01-10-21 cefepime -   Interim history/subjective:  n/a  Objective   Blood pressure (!) 63/45, pulse (!) 59, temperature (!) 97.2 F (36.2 C), resp. rate (!) 36, height 6' (1.829 m), weight 63 kg, SpO2 (!) 68 %.     FiO2 (%):  [100 %] 100 % PEEP:  [6 cmH20] 6 cmH20  No intake or output data in the 24 hours ending Jan 10, 2021 2311 Filed Weights   01/10/2021 2124  Weight: 63 kg    Examination: General: alert/oriented x3 HENT: NCAT, dry MM, eschar/sequela of R parotidectomy Lungs: diminshed bilaterally, increased work of breathing on BiPAP Cardiovascular: RRR, no murmur, no JVD  Abdomen: soft, nontender, normal bowel sounds Extremities: lukewarm, no edema Neuro: grossly nonfocal, follows commands  Resolved Hospital Problem list   n/a  Assessment & Plan:   # Hypotension: Near BP baseline currently. May have initially had minor component of hypovolemia superimposed on low output related to STEMI. RV seemed more dilated to me than baseline and PE is a consideration but felt less likely than STEMI (see below) - stress dose steroids (has been on a slow taper recently) for now - cultures and ABX as below - mgmt of STEMI as below  # STEMI: - cardiology consulted in ED with limited rise in trop plan remains likely cath but not urgently overnight. ACS is felt sufficiently more likely culprit for bedside TTE findings of worsened RV function and septal WMA that especially in setting of AKI with anticipated contrast exposure from cath, we will hold off on CTA Chest.  - s/p full dose ASA, cardiology advises not to start heparin gtt in setting of his chronic thrombocytopenia   - have not added statin, watching LFT trend  ADDENDUM: - case discussed again with cardiology, will start heparin gtt, high-bleeding risk protocol in setting of his TCP,  keep NPO  # SIRS:  - s/p 1L crystalloid  - follow cultures and low threshold to DC ABX as below  # Acute on chronic hypoxic respiratory failure:  - trial of NIV for work of breathing - on stress dose steroids as above - on ABX for possibility of pneumonia but low threshold to discontinue if cultures unrevealing at 48h  # AKI: # Hyperkalemia: May be multifactorial  from fleets enemas/hyperphosphatemia, hypotension. - f/u response to gentle fluid challenge administered by ED - medical management of hyperkalemia  # Lactic acidosis: poor clearance in setting congestive hepatopathy and low output state. - trend   # Transaminitis - trend, if bili not decreasing then RUQ [place]  # Hyperglycemia: serum BHB not impressively elevated. - SSI  # Pancytopenia: oncology following on outpatient basis and had planned BMBx next week - continue GoC discussion   Best practice:  Diet: NPO with sips/meds Pain/Anxiety/Delirium protocol (if indicated): not indicated VAP protocol (if indicated): not indicated DVT prophylaxis: SCDs pending final cath/AC plan today GI prophylaxis: not indicated Glucose control: SSI Mobility: bed level Code Status: DNR - confirmed with patient's wife today. Although his baseline function is relatively preserved (able to drive himself, etc), given his advanced CPFE, chronic hypoxic respiratory failure, recent R parotid carcinoma/tx, pancytopenia with concern for MDS, felt unlikely to achieve quality of life the patient would find satisfactory in the even that he required intubation for respiratory failure or ACLS for cardiac arrest.   Family Communication: Updated at bedside Disposition: ICU  Labs   CBC: Recent Labs  Lab 12/24/20 1446 Jan 19, 2021 2125  WBC 4.4 14.7*  NEUTROABS 3.3 9.6*  HGB 9.1* 9.1*  HCT 29.1* 32.4*  MCV 81.1 90.8  PLT 48* 62*    Basic Metabolic Panel: Recent Labs  Lab 12/24/20 1446 01/19/2021 2125  NA 130* 131*  K 5.2* 7.0*  CL 94* 92*  CO2 28 15*  GLUCOSE 352* 246*  BUN 33* 55*  CREATININE 0.99 1.87*  CALCIUM 9.2 9.1   GFR: Estimated Creatinine Clearance: 29.5 mL/min (A) (by C-G formula based on SCr of 1.87 mg/dL (H)). Recent Labs  Lab 12/24/20 1446 January 19, 2021 2125  WBC 4.4 14.7*    Liver Function Tests: Recent Labs  Lab 12/24/20 1446 19-Jan-2021 2125  AST 14* 469*  ALT 11 367*  ALKPHOS 84 82   BILITOT 1.7* 5.1*  PROT 7.2 6.9  ALBUMIN 2.7* 2.6*   No results for input(s): LIPASE, AMYLASE in the last 168 hours. No results for input(s): AMMONIA in the last 168 hours.  ABG    Component Value Date/Time   HCO3 25.1 11/28/2020 2149   TCO2 23 11/28/2020 2204   O2SAT 45.6 11/28/2020 2149     Coagulation Profile: No results for input(s): INR, PROTIME in the last 168 hours.  Cardiac Enzymes: No results for input(s): CKTOTAL, CKMB, CKMBINDEX, TROPONINI in the last 168 hours.  HbA1C: Hgb A1c MFr Bld  Date/Time Value Ref Range Status  11/29/2020 01:03 AM 5.5 4.8 - 5.6 % Final    Comment:    (NOTE) Pre diabetes:          5.7%-6.4%  Diabetes:              >6.4%  Glycemic control for   <7.0% adults with diabetes     CBG: Recent Labs  Lab 19-Jan-2021 2301  GLUCAP 244*    Review of Systems:   unable to obtain in setting increased work of breathing/difficulty hearing  Past Medical History  He,  has a past medical history of Adenomatous colon polyp, Cataract, Diabetes mellitus, Emphysema, Emphysema of lung ([place]), Erectile dysfunction, Hyperlipidemia, Hypertension, Peripheral vascular disease ([place]), Pneumonia, and Pulmonary nodule.   Surgical History    Past Surgical History:  Procedure Laterality Date  . ABDOMINAL AORTIC ANEURYSM REPAIR  2004  . COLONOSCOPY  multiple since 2004     Social History   reports that he quit smoking about 6 years ago. His smoking use included cigarettes. He has a 90.00 pack-year smoking history. He has never used smokeless tobacco. He reports current alcohol use. He reports that he does not use drugs.   Family History   His family history includes Cancer in his father; Diabetes in his mother; Heart failure in his mother. There is no history of Colon cancer, Esophageal cancer, Liver cancer, Pancreatic cancer, Rectal cancer, or Stomach cancer.   Allergies No Known Allergies   Home Medications  Prior to Admission medications    Medication Sig Start Date End Date Taking? Authorizing Provider  acetaminophen (TYLENOL) 325 MG tablet Take 325 mg by mouth every 6 (six) hours as needed for mild pain.    [provider]  albuterol (PROVENTIL) (2.5 MG/3ML) 0.083% nebulizer solution Take 3 mLs (2.5 mg total) by nebulization every 4 (four) hours as needed for shortness of breath. 12/04/20   Sheikh, Omair Latif, DO  ANORO ELLIPTA 62.5-25 MCG/INH AEPB Take 1 puff by mouth daily. 08/20/17   [provider]  Ascorbic Acid (VITAMIN C) 500 MG CAPS Take 1 capsule by mouth 2 (two) times daily.    [provider]  b complex vitamins capsule Take 1 capsule by mouth daily.    [provider]  Cholecalciferol (VITAMIN D PO) Take 800 Units by mouth daily.    [provider]  Cyanocobalamin (VITAMIN B 12 PO) Take 500 mcg by mouth daily.    [provider]  insulin aspart (NOVOLOG) 100 UNIT/ML injection Inject 5 Units into the skin as directed. 2-3 times daily. 12/10/20   [provider]  Maltodextrin-Xanthan Gum ([place]) POWD Take 1 Container by mouth as needed. 12/04/20   Raiford Noble Latif, DO  mirtazapine (REMERON) 15 MG tablet Take 45 mg by mouth at bedtime. 09/03/20   [provider]  Multiple Vitamin (MULTIVITAMIN WITH MINERALS) TABS tablet Take 1 tablet by mouth daily. 12/05/20   Raiford Noble Latif, DO  Nutritional Supplements (FEEDING SUPPLEMENT, KATE FARMS STANDARD 1.4,) LIQD liquid Take 325 mLs by mouth 3 (three) times daily between meals. 12/04/20   Raiford Noble Latif, DO  OneTouch Delica Lancets 99991111 MISC CHECK BLOOD SUGAR ONCE A DAY. 09/10/16   [provider]  [facility] VERIO test strip  07/12/18   [provider]  pantoprazole (PROTONIX) 40 MG tablet Take 1 tablet (40 mg total) by mouth daily. 12/05/20   Raiford Noble Latif, DO  predniSONE (DELTASONE) 20 MG tablet Take 2 tablets (40 mg total) by mouth daily with breakfast. 12/05/20    Raiford Noble Latif, DO  protein supplement (RESOURCE BENEPROTEIN) 6 g POWD Take 1 Scoop (6 g total) by mouth 3 (three) times daily with meals. 12/04/20   Kerney Elbe, DO     Critical care time: 70 minutes    This patient is critically ill with multiple organ system failure; which, requires frequent high complexity decision making, assessment, support, evaluation, and titration of therapies. This was completed through the application of advanced monitoring technologies and extensive interpretation of multiple databases.  During this encounter critical care time was devoted to patient care services described in this note for 74 minutes.  Letta Median, Pulmonary/Critical Care

## 2020-12-27 ENCOUNTER — Inpatient Hospital Stay (HOSPITAL_COMMUNITY): Payer: Medicare Other

## 2020-12-27 DIAGNOSIS — K761 Chronic passive congestion of liver: Secondary | ICD-10-CM | POA: Diagnosis present

## 2020-12-27 DIAGNOSIS — Z85828 Personal history of other malignant neoplasm of skin: Secondary | ICD-10-CM | POA: Diagnosis not present

## 2020-12-27 DIAGNOSIS — J9621 Acute and chronic respiratory failure with hypoxia: Secondary | ICD-10-CM | POA: Diagnosis present

## 2020-12-27 DIAGNOSIS — G9341 Metabolic encephalopathy: Secondary | ICD-10-CM | POA: Diagnosis present

## 2020-12-27 DIAGNOSIS — Z66 Do not resuscitate: Secondary | ICD-10-CM | POA: Diagnosis present

## 2020-12-27 DIAGNOSIS — Z794 Long term (current) use of insulin: Secondary | ICD-10-CM | POA: Diagnosis not present

## 2020-12-27 DIAGNOSIS — Z7952 Long term (current) use of systemic steroids: Secondary | ICD-10-CM | POA: Diagnosis not present

## 2020-12-27 DIAGNOSIS — Z87891 Personal history of nicotine dependence: Secondary | ICD-10-CM | POA: Diagnosis not present

## 2020-12-27 DIAGNOSIS — R778 Other specified abnormalities of plasma proteins: Secondary | ICD-10-CM

## 2020-12-27 DIAGNOSIS — E875 Hyperkalemia: Secondary | ICD-10-CM | POA: Diagnosis present

## 2020-12-27 DIAGNOSIS — E1151 Type 2 diabetes mellitus with diabetic peripheral angiopathy without gangrene: Secondary | ICD-10-CM | POA: Diagnosis present

## 2020-12-27 DIAGNOSIS — Z7951 Long term (current) use of inhaled steroids: Secondary | ICD-10-CM | POA: Diagnosis not present

## 2020-12-27 DIAGNOSIS — F419 Anxiety disorder, unspecified: Secondary | ICD-10-CM | POA: Diagnosis present

## 2020-12-27 DIAGNOSIS — E785 Hyperlipidemia, unspecified: Secondary | ICD-10-CM | POA: Diagnosis present

## 2020-12-27 DIAGNOSIS — Z7189 Other specified counseling: Secondary | ICD-10-CM | POA: Diagnosis not present

## 2020-12-27 DIAGNOSIS — R570 Cardiogenic shock: Secondary | ICD-10-CM | POA: Diagnosis present

## 2020-12-27 DIAGNOSIS — N179 Acute kidney failure, unspecified: Secondary | ICD-10-CM | POA: Diagnosis present

## 2020-12-27 DIAGNOSIS — Z515 Encounter for palliative care: Secondary | ICD-10-CM | POA: Diagnosis not present

## 2020-12-27 DIAGNOSIS — L8990 Pressure ulcer of unspecified site, unspecified stage: Secondary | ICD-10-CM | POA: Insufficient documentation

## 2020-12-27 DIAGNOSIS — R0602 Shortness of breath: Secondary | ICD-10-CM | POA: Diagnosis present

## 2020-12-27 DIAGNOSIS — I2109 ST elevation (STEMI) myocardial infarction involving other coronary artery of anterior wall: Secondary | ICD-10-CM | POA: Diagnosis present

## 2020-12-27 DIAGNOSIS — E1165 Type 2 diabetes mellitus with hyperglycemia: Secondary | ICD-10-CM | POA: Diagnosis present

## 2020-12-27 DIAGNOSIS — Z79899 Other long term (current) drug therapy: Secondary | ICD-10-CM | POA: Diagnosis not present

## 2020-12-27 DIAGNOSIS — E872 Acidosis: Secondary | ICD-10-CM | POA: Diagnosis present

## 2020-12-27 DIAGNOSIS — Z85818 Personal history of malignant neoplasm of other sites of lip, oral cavity, and pharynx: Secondary | ICD-10-CM | POA: Diagnosis not present

## 2020-12-27 DIAGNOSIS — I110 Hypertensive heart disease with heart failure: Secondary | ICD-10-CM | POA: Diagnosis present

## 2020-12-27 DIAGNOSIS — R29810 Facial weakness: Secondary | ICD-10-CM | POA: Diagnosis present

## 2020-12-27 DIAGNOSIS — Z20822 Contact with and (suspected) exposure to covid-19: Secondary | ICD-10-CM | POA: Diagnosis present

## 2020-12-27 DIAGNOSIS — R579 Shock, unspecified: Secondary | ICD-10-CM | POA: Diagnosis not present

## 2020-12-27 LAB — BLOOD GAS, VENOUS
Acid-base deficit: 0.6 mmol/L (ref 0.0–2.0)
Bicarbonate: 25 mmol/L (ref 20.0–28.0)
Drawn by: 5790
O2 Saturation: 38.3 %
Patient temperature: 36.3
pCO2, Ven: 50.6 mmHg (ref 44.0–60.0)
pH, Ven: 7.31 (ref 7.250–7.430)
pO2, Ven: <31 mmHg — CL (ref 32.0–45.0)

## 2020-12-27 LAB — CBC
HCT: 24.5 % — ABNORMAL LOW (ref 39.0–52.0)
HCT: 24.6 % — ABNORMAL LOW (ref 39.0–52.0)
Hemoglobin: 7.5 g/dL — ABNORMAL LOW (ref 13.0–17.0)
Hemoglobin: 7.5 g/dL — ABNORMAL LOW (ref 13.0–17.0)
MCH: 25.3 pg — ABNORMAL LOW (ref 26.0–34.0)
MCH: 25.2 pg — ABNORMAL LOW (ref 26.0–34.0)
MCHC: 30.6 g/dL (ref 30.0–36.0)
MCHC: 30.5 g/dL (ref 30.0–36.0)
MCV: 82.8 fL (ref 80.0–100.0)
MCV: 82.6 fL (ref 80.0–100.0)
Platelets: 38 10*3/uL — ABNORMAL LOW (ref 150–400)
Platelets: 36 10*3/uL — ABNORMAL LOW (ref 150–400)
RBC: 2.96 MIL/uL — ABNORMAL LOW (ref 4.22–5.81)
RBC: 2.98 MIL/uL — ABNORMAL LOW (ref 4.22–5.81)
RDW: 26.5 % — ABNORMAL HIGH (ref 11.5–15.5)
RDW: 26.1 % — ABNORMAL HIGH (ref 11.5–15.5)
WBC: 8.4 10*3/uL (ref 4.0–10.5)
WBC: 7.1 10*3/uL (ref 4.0–10.5)
nRBC: 5.1 % — ABNORMAL HIGH (ref 0.0–0.2)
nRBC: 6.4 % — ABNORMAL HIGH (ref 0.0–0.2)

## 2020-12-27 LAB — COMPREHENSIVE METABOLIC PANEL
ALT: 1512 U/L — ABNORMAL HIGH (ref 0–44)
ALT: 1590 U/L — ABNORMAL HIGH (ref 0–44)
AST: 2338 U/L — ABNORMAL HIGH (ref 15–41)
AST: 1721 U/L — ABNORMAL HIGH (ref 15–41)
Albumin: 2.3 g/dL — ABNORMAL LOW (ref 3.5–5.0)
Albumin: 2.4 g/dL — ABNORMAL LOW (ref 3.5–5.0)
Alkaline Phosphatase: 73 U/L (ref 38–126)
Alkaline Phosphatase: 73 U/L (ref 38–126)
Anion gap: 12 (ref 5–15)
Anion gap: 14 (ref 5–15)
BUN: 64 mg/dL — ABNORMAL HIGH (ref 8–23)
BUN: 69 mg/dL — ABNORMAL HIGH (ref 8–23)
CO2: 26 mmol/L (ref 22–32)
CO2: 22 mmol/L (ref 22–32)
Calcium: 8.7 mg/dL — ABNORMAL LOW (ref 8.9–10.3)
Calcium: 8.7 mg/dL — ABNORMAL LOW (ref 8.9–10.3)
Chloride: 92 mmol/L — ABNORMAL LOW (ref 98–111)
Chloride: 98 mmol/L (ref 98–111)
Creatinine, Ser: 1.98 mg/dL — ABNORMAL HIGH (ref 0.61–1.24)
Creatinine, Ser: 1.66 mg/dL — ABNORMAL HIGH (ref 0.61–1.24)
GFR, Estimated: 34 mL/min — ABNORMAL LOW (ref >60)
GFR, Estimated: 42 mL/min — ABNORMAL LOW (ref >60)
Glucose, Bld: 440 mg/dL — ABNORMAL HIGH (ref 70–99)
Glucose, Bld: 171 mg/dL — ABNORMAL HIGH (ref 70–99)
Potassium: 5.4 mmol/L — ABNORMAL HIGH (ref 3.5–5.1)
Potassium: 4.4 mmol/L (ref 3.5–5.1)
Sodium: 130 mmol/L — ABNORMAL LOW (ref 135–145)
Sodium: 134 mmol/L — ABNORMAL LOW (ref 135–145)
Total Bilirubin: 2.5 mg/dL — ABNORMAL HIGH (ref 0.3–1.2)
Total Bilirubin: 2 mg/dL — ABNORMAL HIGH (ref 0.3–1.2)
Total Protein: 5.9 g/dL — ABNORMAL LOW (ref 6.5–8.1)
Total Protein: 6.3 g/dL — ABNORMAL LOW (ref 6.5–8.1)

## 2020-12-27 LAB — ECHOCARDIOGRAM COMPLETE
Area-P 1/2: 3.48 cm2
Height: 72 in
S' Lateral: 2.9 cm
Weight: 2222.24 oz

## 2020-12-27 LAB — MRSA PCR SCREENING: MRSA by PCR: NEGATIVE

## 2020-12-27 LAB — I-STAT VENOUS BLOOD GAS, ED
Acid-base deficit: 4 mmol/L — ABNORMAL HIGH (ref 0.0–2.0)
Bicarbonate: 21.9 mmol/L (ref 20.0–28.0)
Calcium, Ion: 1.11 mmol/L — ABNORMAL LOW (ref 1.15–1.40)
HCT: 21 % — ABNORMAL LOW (ref 39.0–52.0)
Hemoglobin: 7.1 g/dL — ABNORMAL LOW (ref 13.0–17.0)
O2 Saturation: 79 %
Potassium: 5.1 mmol/L (ref 3.5–5.1)
Sodium: 132 mmol/L — ABNORMAL LOW (ref 135–145)
TCO2: 23 mmol/L (ref 22–32)
pCO2, Ven: 41.2 mmHg — ABNORMAL LOW (ref 44.0–60.0)
pH, Ven: 7.334 (ref 7.250–7.430)
pO2, Ven: 46 mmHg — ABNORMAL HIGH (ref 32.0–45.0)

## 2020-12-27 LAB — GLUCOSE, CAPILLARY
Glucose-Capillary: 440 mg/dL — ABNORMAL HIGH (ref 70–99)
Glucose-Capillary: 439 mg/dL — ABNORMAL HIGH (ref 70–99)
Glucose-Capillary: 408 mg/dL — ABNORMAL HIGH (ref 70–99)

## 2020-12-27 LAB — PROTIME-INR
INR: 1.6 — ABNORMAL HIGH (ref 0.8–1.2)
Prothrombin Time: 19.3 seconds — ABNORMAL HIGH (ref 11.4–15.2)

## 2020-12-27 LAB — CK: Total CK: 26 U/L — ABNORMAL LOW (ref 49–397)

## 2020-12-27 LAB — LACTIC ACID, PLASMA
Lactic Acid, Venous: 10.2 mmol/L (ref 0.5–1.9)
Lactic Acid, Venous: 5.4 mmol/L (ref 0.5–1.9)

## 2020-12-27 LAB — TROPONIN I (HIGH SENSITIVITY): Troponin I (High Sensitivity): 8369 ng/L (ref <18)

## 2020-12-27 LAB — PATHOLOGIST SMEAR REVIEW

## 2020-12-27 LAB — PHOSPHORUS: Phosphorus: 6.7 mg/dL — ABNORMAL HIGH (ref 2.5–4.6)

## 2020-12-27 LAB — HEPARIN LEVEL (UNFRACTIONATED): Heparin Unfractionated: <0.1 IU/mL — ABNORMAL LOW (ref 0.30–0.70)

## 2020-12-27 LAB — CBG MONITORING, ED: Glucose-Capillary: 288 mg/dL — ABNORMAL HIGH (ref 70–99)

## 2020-12-27 LAB — BETA-HYDROXYBUTYRIC ACID: Beta-Hydroxybutyric Acid: 0.44 mmol/L — ABNORMAL HIGH (ref 0.05–0.27)

## 2020-12-27 MED ORDER — HEPARIN (PORCINE) 25000 UT/250ML-% IV SOLN
800.0000 [IU]/h | INTRAVENOUS | Status: DC
  Administered 2020-12-27: 800 [IU]/h via INTRAVENOUS
  Filled 2020-12-27: qty 250

## 2020-12-27 MED ORDER — ASPIRIN 300 MG RE SUPP: 300.0000 mg | Freq: Once | RECTAL | Status: DC

## 2020-12-27 MED ORDER — DOCUSATE SODIUM 100 MG PO CAPS: 100.0000 mg | ORAL_CAPSULE | Freq: Two times a day (BID) | ORAL | Status: DC | PRN

## 2020-12-27 MED ORDER — CHLORHEXIDINE GLUCONATE 0.12 % MT SOLN
15.0000 mL | Freq: Two times a day (BID) | OROMUCOSAL | Status: DC
  Filled 2020-12-27: qty 15

## 2020-12-27 MED ORDER — ORAL CARE MOUTH RINSE
15.0000 mL | Freq: Two times a day (BID) | OROMUCOSAL | Status: DC
  Administered 2020-12-27: 15 mL via OROMUCOSAL

## 2020-12-27 MED ORDER — ARFORMOTEROL TARTRATE 15 MCG/2ML IN NEBU
15.0000 ug | INHALATION_SOLUTION | Freq: Two times a day (BID) | RESPIRATORY_TRACT | Status: DC
  Administered 2020-12-27 – 2020-12-28 (×4): 15 ug via RESPIRATORY_TRACT
  Filled 2020-12-27 (×9): qty 2

## 2020-12-27 MED ORDER — MORPHINE SULFATE (PF) 2 MG/ML IV SOLN
2.0000 mg | INTRAVENOUS | Status: DC | PRN
  Administered 2020-12-28 – 2020-12-29 (×8): 2 mg via INTRAVENOUS
  Filled 2020-12-27 (×9): qty 1

## 2020-12-27 MED ORDER — HYDROCORTISONE NA SUCCINATE PF 100 MG IJ SOLR
50.0000 mg | Freq: Four times a day (QID) | INTRAMUSCULAR | Status: DC
  Administered 2020-12-27: 50 mg via INTRAVENOUS
  Filled 2020-12-27: qty 2

## 2020-12-27 MED ORDER — IPRATROPIUM-ALBUTEROL 0.5-2.5 (3) MG/3ML IN SOLN
3.0000 mL | Freq: Four times a day (QID) | RESPIRATORY_TRACT | Status: DC
  Administered 2020-12-27 – 2020-12-28 (×5): 3 mL via RESPIRATORY_TRACT
  Filled 2020-12-27 (×5): qty 3

## 2020-12-27 MED ORDER — INSULIN ASPART 100 UNIT/ML IJ SOLN
0.0000 [IU] | INTRAMUSCULAR | Status: DC
  Administered 2020-12-27: 9 [IU] via SUBCUTANEOUS

## 2020-12-27 MED ORDER — INSULIN ASPART 100 UNIT/ML IJ SOLN: 0.0000 [IU] | Freq: Three times a day (TID) | INTRAMUSCULAR | Status: DC

## 2020-12-27 MED ORDER — CHLORHEXIDINE GLUCONATE CLOTH 2 % EX PADS
6.0000 | MEDICATED_PAD | Freq: Every day | CUTANEOUS | Status: DC
  Administered 2020-12-28: 6 via TOPICAL

## 2020-12-27 MED ORDER — INSULIN ASPART 100 UNIT/ML IJ SOLN
0.0000 [IU] | Freq: Four times a day (QID) | INTRAMUSCULAR | Status: DC
Start: 2020-12-27 — End: 2020-12-27
  Administered 2020-12-27: 20 [IU] via SUBCUTANEOUS

## 2020-12-27 MED ORDER — METHYLPREDNISOLONE SODIUM SUCC 40 MG IJ SOLR: 40.0000 mg | Freq: Every day | INTRAMUSCULAR | Status: DC

## 2020-12-27 MED ORDER — POLYETHYLENE GLYCOL 3350 17 G PO PACK: 17.0000 g | PACK | Freq: Every day | ORAL | Status: DC | PRN

## 2020-12-27 MED ORDER — HEPARIN SODIUM (PORCINE) 5000 UNIT/ML IJ SOLN: 4000.0000 [IU] | Freq: Once | INTRAMUSCULAR | Status: DC

## 2020-12-27 MED ORDER — INSULIN GLARGINE 100 UNIT/ML ~~LOC~~ SOLN
10.0000 [IU] | Freq: Every day | SUBCUTANEOUS | Status: DC
  Administered 2020-12-27: 10 [IU] via SUBCUTANEOUS
  Filled 2020-12-27: qty 0.1

## 2020-12-27 NOTE — Progress Notes (Signed)
  Echocardiogram 2D Echocardiogram with 3D and strain has been performed.  Geoffrey Wood M 12/27/2020, 9:28 AM

## 2020-12-27 NOTE — Consult Note (Addendum)
Cardiology Consultation:   Patient ID: Geoffrey Wood MRN: [id number]; DOB: 1946/06/10  Admit date: 01-01-21 Date of Consult: 12/27/2020  Primary Care Provider: Crist Infante, MD [facility] Cardiologist: Mertie Moores, MD  [place] Electrophysiologist:  None   Patient Profile:   Geoffrey Wood is a 75 y.o. male with HTN, HLD, DM2, AAA s/p open surgical repair, thrombocytopenia, SqCC, parotid CA s/p surgical excision/radiation and acute on chronic respiratory failure 2/2 IPF (bl 2-3 L O2) who presents with worsening SOB and mixed shock.   History of Present Illness:   Geoffrey Wood noticed worsening shortness of breath around 24 hours prior to admission that worsened over 5/12 PM while he was playing poker with his friends.  At baseline he uses 2 L oxygen for his IPF however denies any significant functional limitation related to his lung disease.  He denies any chest pain, pressure, dyspnea on exertion, orthopnea, or PND.  His only symptom is moderate, 5/10 severity shortness of breath.  He has extensive smoking history but denies any previous coronary evaluation.  He had open surgical repair of an abdominal aortic aneurysm in 2003.  Of note he has been treated for IPF flare over the past month and was weaning down off prednisone.  His wife said that he had not eaten as well as normal yesterday and that he had missed some of his insulin.  She was giving him 5 units of short acting insulin 2-3 times daily while he is on prednisone.  Past Medical History:  Diagnosis Date  . Adenomatous colon polyp   . Cataract   . Diabetes mellitus   . Emphysema   . Emphysema of lung ([place])   . Erectile dysfunction   . Hyperlipidemia   . Hypertension   . Peripheral vascular disease ([place])   . Pneumonia   . Pulmonary nodule    Past Surgical History:  Procedure Laterality Date  . ABDOMINAL AORTIC ANEURYSM REPAIR  2004  . COLONOSCOPY  multiple since 2004    Home Medications:  Prior to  Admission medications   Medication Sig Start Date End Date Taking? Authorizing Provider  acetaminophen (TYLENOL) 325 MG tablet Take 325 mg by mouth every 6 (six) hours as needed for mild pain.   Yes [provider]  acetaminophen (TYLENOL) 500 MG tablet Take 1,000 mg by mouth every 6 (six) hours as needed for moderate pain or headache.   Yes [provider]  albuterol (PROVENTIL) (2.5 MG/3ML) 0.083% nebulizer solution Take 3 mLs (2.5 mg total) by nebulization every 4 (four) hours as needed for shortness of breath. 12/04/20  Yes Sheikh, Omair Latif, DO  ANORO ELLIPTA 62.5-25 MCG/INH AEPB Take 1 puff by mouth daily. 08/20/17  Yes [provider]  Ascorbic Acid (VITAMIN C) 500 MG CAPS Take 1 capsule by mouth 2 (two) times daily.   Yes [provider]  b complex vitamins capsule Take 1 capsule by mouth daily.   Yes [provider]  Cholecalciferol (VITAMIN D PO) Take 800 Units by mouth daily.   Yes [provider]  Cyanocobalamin (VITAMIN B 12 PO) Take 500 mcg by mouth daily.   Yes [provider]  insulin aspart (NOVOLOG) 100 UNIT/ML injection Inject 5 Units into the skin 3 (three) times daily. 12/10/20  Yes [provider]  Maltodextrin-Xanthan Gum ([place]) POWD Take 1 Container by mouth as needed. 12/04/20  Yes Sheikh, Omair Latif, DO  mirtazapine (REMERON) 15 MG tablet Take 45 mg by mouth  at bedtime. 09/03/20  Yes [provider]  Multiple Vitamin (MULTIVITAMIN WITH MINERALS) TABS tablet Take 1 tablet by mouth daily. 12/05/20  Yes Sheikh, Omair Latif, DO  Nutritional Supplements (ENSURE COMPLETE PO) Take 237 mLs by mouth 3 (three) times daily.   Yes [provider]  Nutritional Supplements (FEEDING SUPPLEMENT, KATE FARMS STANDARD 1.4,) LIQD liquid Take 325 mLs by mouth 3 (three) times daily between meals. 12/04/20  Yes Sheikh, Omair Latif, DO  OneTouch Delica Lancets [id number] MISC CHECK BLOOD SUGAR ONCE A  DAY. 09/10/16  Yes [provider]  ONETOUCH VERIO test strip  07/12/18  Yes [provider]  OVER THE COUNTER MEDICATION Take 2 capsules by mouth daily. Juice plus vitamin   Yes [provider]  OVER THE COUNTER MEDICATION Take 1 Dose by mouth See admin instructions. Mix with [place], ensure, foods (benecalcium)   Yes [provider]  pantoprazole (PROTONIX) 40 MG tablet Take 1 tablet (40 mg total) by mouth daily. 12/05/20  Yes Sheikh, Omair Latif, DO  predniSONE (DELTASONE) 10 MG tablet Take 10 mg by mouth See admin instructions. Qd x 5 days   Yes [provider]  protein supplement (RESOURCE BENEPROTEIN) 6 g POWD Take 1 Scoop (6 g total) by mouth 3 (three) times daily with meals. 12/04/20  Yes Kerney Elbe, DO   Inpatient Medications: Scheduled Meds: . arformoterol  15 mcg Nebulization BID  . aspirin  300 mg Rectal Once  . chlorhexidine  15 mL Mouth Rinse BID  . Chlorhexidine Gluconate Cloth  6 each Topical Daily  . hydrocortisone sod succinate (SOLU-CORTEF) inj  50 mg Intravenous Q6H  . insulin aspart  0-9 Units Subcutaneous Q4H  . ipratropium-albuterol  3 mL Nebulization Q6H  . mouth rinse  15 mL Mouth Rinse q12n4p   Continuous Infusions: . heparin 800 Units/hr (12/27/20 [id number])  . vancomycin 1,250 mg (12/27/20 0659)   PRN Meds: docusate sodium, polyethylene glycol  Allergies:   No Known Allergies  Social History:   Social History   Socioeconomic History  . Marital status: Married    Spouse name: Not on file  . Number of children: Not on file  . Years of education: Not on file  . Highest education level: Not on file  Occupational History  . Occupation: Scientist, clinical (histocompatibility and immunogenetics): Designer, television/film set  Tobacco Use  . Smoking status: Former Smoker    Packs/day: 1.50    Years: 60.00    Pack years: 90.00    Types: Cigarettes    Quit date: 09/17/2014    Years since quitting: 6.2  . Smokeless tobacco: Never Used  Vaping  Use  . Vaping Use: Never used  Substance and Sexual Activity  . Alcohol use: Yes    Comment: not even 1 drink a week  . Drug use: No  . Sexual activity: Not Currently  Other Topics Concern  . Not on file  Social History Narrative  . Not on file   Social Determinants of Health   Financial Resource Strain: Not on file  Food Insecurity: No Food Insecurity  . Worried About Charity fundraiser in the Last Year: Never true  . Ran Out of Food in the Last Year: Never true  Transportation Needs: No Transportation Needs  . Lack of Transportation (Medical): No  . Lack of Transportation (Non-Medical): No  Physical Activity: Not on file  Stress: Not on file  Social Connections: Not on file  Intimate Partner Violence: Not  on file    Family History:    Family History  Problem Relation Age of Onset  . Heart failure Mother   . Diabetes Mother   . Cancer Father        lung  . Colon cancer Neg Hx   . Esophageal cancer Neg Hx   . Liver cancer Neg Hx   . Pancreatic cancer Neg Hx   . Rectal cancer Neg Hx   . Stomach cancer Neg Hx     ROS:  Review of Systems: [y] = yes, [ ]  = no       General: Weight gain [ ] ; Weight loss [ ] ; Anorexia [ ] ; Fatigue [ ] ; Fever [ ] ; Chills [ ] ; Weakness [ ]     Cardiac: Chest pain/pressure [ ] ; Resting SOB [ ] ; Exertional SOB [ ] ; Orthopnea [ ] ; Pedal Edema [ ] ; Palpitations [ ] ; Syncope [ ] ; Presyncope [ ] ; Paroxysmal nocturnal dyspnea [ ]     Pulmonary: Cough [ ] ; Wheezing [ ] ; Hemoptysis [ ] ; Sputum [ ] ; Snoring [ ]     GI: Vomiting [ ] ; Dysphagia [ ] ; Melena [ ] ; Hematochezia [ ] ; Heartburn [ ] ; Abdominal pain [ ] ; Constipation [ ] ; Diarrhea [ ] ; BRBPR [ ]     GU: Hematuria [ ] ; Dysuria [ ] ; Nocturia [ ]   Vascular: Pain in legs with walking [ ] ; Pain in feet with lying flat [ ] ; Non-healing sores [ ] ; Stroke [ ] ; TIA [ ] ; Slurred speech [ ] ;    Neuro: Headaches [ ] ; Vertigo [ ] ; Seizures [ ] ; Paresthesias [ ] ;Blurred vision [ ] ; Diplopia [ ] ; Vision  changes [ ]     Ortho/Skin: Arthritis [ ] ; Joint pain [ ] ; Muscle pain [ ] ; Joint swelling [ ] ; Back Pain [ ] ; Rash [ ]     Psych: Depression [ ] ; Anxiety [ ]     Heme: Bleeding problems [ ] ; Clotting disorders [ ] ; Anemia [ ]     Endocrine: Diabetes [ ] ; Thyroid dysfunction [ ]    Physical Exam/Data:   Vitals:   12/27/20 0500 12/27/20 0600 12/27/20 0700 12/27/20 0740  BP: (!) 74/46 (!) 80/59 96/69 96/69   Pulse:    81  Resp: (!) 24 19 (!) 21 (!) 30  Temp:   (!) 96.6 F (35.9 C)   TempSrc:   Axillary   SpO2: 100% 100% 100% 100%  Weight:      Height:        Intake/Output Summary (Last 24 hours) at 12/27/2020 0801 Last data filed at 12/27/2020 0400 Gross per 24 hour  Intake 376.9 ml  Output 250 ml  Net 126.9 ml   Last 3 Weights 01/14/2021 12/24/2020 12/11/2020  Weight (lbs) 138 lb 14.2 oz 139 lb 14.4 oz 137 lb  Weight (kg) 63 kg 63.458 kg 62.143 kg     Body mass index is 18.84 kg/m.  General:  Well nourished, well developed, mild respiratory distress HEENT: normal Lymph: no adenopathy Neck: no JVD Endocrine:  No thryomegaly Vascular: No carotid bruits; FA pulses 2+ bilaterally without bruits  Cardiac:  normal S1, S2; RRR; no murmur  Lungs:  clear to auscultation bilaterally, no wheezing, rhonchi or rales  Abd: soft, nontender, no hepatomegaly  Ext: no edema Musculoskeletal:  No deformities, BUE and BLE strength normal and equal Skin: numerous small abrasions, R facial excision from [facility]  Neuro:  CNs 2-12 intact, R facial droop (R facial nerve cut during surgery) Psych:  Normal affect  EKG:  The EKG was personally reviewed and demonstrates: dynamic ST changes with ST elevations in I/aVL and II, ST dep in V3, diffuse TWI (seen on prior)  Telemetry:  Telemetry was personally reviewed and demonstrates: NSR   Relevant CV Studies:  CTPE Result date: 11/28/20 No pulmonary embolism. Severe centrilobular emphysema. Interval development of extensive bibasilar pulmonary  infiltrate and consolidation most in keeping with atypical infection in the acute setting with small associated right parapneumonic effusion. Less commonly, this may be seen with aspiration or drug reaction. Extensive mediastinal adenopathy may be reactive in nature given its relatively rapid development. Follow-up imaging in 3 months, once the patient acute issues have resolved, would be helpful in documnting resolution. Extensive multi-vessel coronary artery calcification. Mild cardiomegaly with particular enlargement of the right ventricle.  Laboratory Data:  High Sensitivity Troponin:   Recent Labs  Lab 11/28/20 2149 11/29/20 0154 11/29/20 0506 Jan 10, 2021 2125 Jan 10, 2021 2325  TROPONINIHS 11 22* 10 7,950* 8,369*     Chemistry Recent Labs  Lab 12/24/20 1446 01/10/2021 2125 12/27/20 0018 12/27/20 0321  NA 130* 131* 132* 130*  K 5.2* 7.0* 5.1 5.4*  CL 94* 92*  --  92*  CO2 28 15*  --  26  GLUCOSE 352* 246*  --  440*  BUN 33* 55*  --  64*  CREATININE 0.99 1.87*  --  1.98*  CALCIUM 9.2 9.1  --  8.7*  GFRNONAA >60 37*  --  34*  ANIONGAP 8 24*  --  12    Recent Labs  Lab 12/24/20 1446 Jan 10, 2021 2125 12/27/20 0321  PROT 7.2 6.9 5.9*  ALBUMIN 2.7* 2.6* 2.3*  AST 14* 469* 2,338*  ALT 11 367* 1,512*  ALKPHOS 84 82 73  BILITOT 1.7* 5.1* 2.5*   Hematology Recent Labs  Lab 12/24/20 1446 2021-01-10 2125 12/27/20 0018  WBC 4.4 14.7*  --   RBC 3.59* 3.57*  --   HGB 9.1* 9.1* 7.1*  HCT 29.1* 32.4* 21.0*  MCV 81.1 90.8  --   MCH 25.3* 25.5*  --   MCHC 31.3 28.1*  --   RDW 25.9* 27.9*  --   PLT 48* 62*  --    BNP Recent Labs  Lab Jan 10, 2021 2125  BNP 2,467.2*    DDimer No results for input(s): DDIMER in the last 168 hours.  Radiology/Studies:  DG Chest Portable 1 View  Result Date: 01-10-21 CLINICAL DATA:  Shortness of breath EXAM: PORTABLE CHEST 1 VIEW COMPARISON:  11/28/2020 FINDINGS: Cardiac shadow is stable. Diffuse fibrotic changes are again identified  throughout both lungs. No focal confluent infiltrate or effusion is seen. Bony structures are within normal limits. IMPRESSION: Chronic fibrotic changes bilaterally.  No acute abnormality noted. Electronically Signed   By: Inez Catalina M.D.   On: 10-Jan-2021 22:25      :[id number]}   TIMI Risk Score for ST  Elevation MI:   The patient's TIMI risk score is 9, which indicates a 35.9% risk of all cause mortality at 30 days. {  Assessment and Plan:   Geoffrey Wood is a 75 y.o. male with HTN, HLD, DM2, AAA s/p open surgical repair, thrombocytopenia, SqCC, parotid CA s/p surgical excision/radiation and acute on chronic respiratory failure 2/2 IPF (bl 2-3 L O2) who presents with worsening SOB and mixed shock.  He presented with significant anion gap metabolic acidosis with a lactate of 10.2.  Lab work consistent with multiorgan shock with worsening transaminitis and AKI.  His ECG revealed a dynamic ST  changes predominantly in the lateral leads concerning for STEMI.  His symptoms were not significantly different during that time than on presentation.  He also had electrolyte derangements with hyperkalemia to 7.0.  He had a marked troponin elevation (7950) however his 2-hour delta only went to 8369 which is less than I would have expected if he had developed a STEMI while being evaluated in the ED.  Bedside echo was consistent with mildly reduced EF with septal and inferior wall motion abnormalities.  He was given 1 L IV fluids and his lactate did show some improvement (repeat 5.4).  Reviewing his prior CT PE last month when he was hospitalized for hypoxia, his scan shows significant coronary calcifications.  I am not sure which happened first with regards to what sent him into mixed shock however I do think that he will warrant coronary evaluation once more stable.  He is high risk for bleeding given his thrombocytopenia at baseline and his prior AAA repair makes bailout options for mechanical support more  complicated.  He has been treated with aspirin/heparin and will get a repeat echo today.  It is hard to differentiate whether this new shortness of breath is his anginal equivalent or whether he was developing DKA in the setting of steroids, less nutrition, and less p.o. intake over the past 24 hours along with missed doses of insulin.  His beta hydroxybutyrate was not significantly elevated although it is odd that he had normalization of his potassium and significant improvement in his lactic acidosis with just a small amount of IV fluids if this was primarily cardiogenic in nature. - obtain TTE today, if WMAs then will discuss risk/benefit of coronary evaluation with patient and wife  - continue ASA/heparin gtt   For questions or updates, please contact [facility] Please consult [email or web address] for contact info under   Signed, Dion Body, MD  12/27/2020 8:01 AM

## 2020-12-27 NOTE — Plan of Care (Signed)
PCCM Goals of Care Discussion and Advanced Care Planning:   Date: 12/27/2020   Present Parties: I called and spoke with the patients wife via phone.   What was discussed:   Current decompensation, hypotension, echo results, worsening kidney function, no UOP. We discussed his overall prognosis and failing organ systems. She is in agreement for DNR status which was made last night. But, she is on the way to the hospital. She wants [place] to focus on his comfort.   Outcome:   Comfort care orders placed PRN morphine I called and spoke with cardiology   18 mins of time was spent discussing the goals of care, advanced care planning options such as code status as well as do not resuscitate forms. [phone])

## 2020-12-27 NOTE — Progress Notes (Signed)
Inpatient Diabetes Program Recommendations  AACE/ADA: New Consensus Statement on Inpatient Glycemic Control (2015)  Target Ranges:  Prepandial:   less than 140 mg/dL      Peak postprandial:   less than 180 mg/dL (1-2 hours)      Critically ill patients:  140 - 180 mg/dL   Lab Results  Component Value Date   GLUCAP 439 (H) 12/27/2020   HGBA1C 5.5 11/29/2020   Results for ANTWIONE, PICOTTE (MRN [id number]) as of 12/27/2020 09:53  Ref. Range 12/04/2020 11:23 01/13/2021 23:01 12/27/2020 00:20 12/27/2020 06:22 12/27/2020 07:30  Glucose-Capillary Latest Ref Range: 70 - 99 mg/dL 247 (H) 244 (H) 288 (H) 440 (H) 439 (H)   Review of Glycemic Control  Diabetes history: type 2 Outpatient Diabetes medications: Novolog 5 units TID Current orders for Inpatient glycemic control: Novolog RESISTANT correction scale every 6 hours.  Inpatient Diabetes Program Recommendations:   Noted that blood sugars have been greater than 300 mg/dl.  Recommend starting Lantus 12 units daily (weight based 64 kg X 0.2 units/kg) if blood sugars continue to be elevated. May need to titrate dosage if patient continues on steroids. May need to increase to Lantus BID.   Will continue to monitor blood sugars while in the hospital.  Harvel Ricks RN BSN CDE Diabetes Coordinator Pager: [phone]  8am-5pm

## 2020-12-27 NOTE — Progress Notes (Signed)
ANTICOAGULATION CONSULT NOTE - Initial Consult  Pharmacy Consult for heparin Indication: STEMI in setting of pancytopenia  No Known Allergies  Patient Measurements: Height: 6' (182.9 cm) Weight: 63 kg (138 lb 14.2 oz) IBW/kg (Calculated) : 77.6  Vital Signs: Temp: 98.5 F (36.9 C) (05/13 0224) Temp Source: Axillary (05/13 0224) BP: 74/46 (05/13 0500) Pulse Rate: 89 (05/13 0145)  Labs: Recent Labs    12/24/20 1446 09-Jan-2021 2125 01-09-21 2325 12/27/20 0018 12/27/20 0321  HGB 9.1* 9.1*  --  7.1*  --   HCT 29.1* 32.4*  --  21.0*  --   PLT 48* 62*  --   --   --   LABPROT  --   --   --   --  19.3*  INR  --   --   --   --  1.6*  CREATININE 0.99 1.87*  --   --  1.98*  CKTOTAL  --   --  26*  --   --   TROPONINIHS  --  7,950* 8,369*  --   --     Estimated Creatinine Clearance: 27.8 mL/min (A) (by C-G formula based on SCr of 1.98 mg/dL (H)).   Medical History: Past Medical History:  Diagnosis Date  . Adenomatous colon polyp   . Cataract   . Diabetes mellitus   . Emphysema   . Emphysema of lung ([place])   . Erectile dysfunction   . Hyperlipidemia   . Hypertension   . Peripheral vascular disease ([place])   . Pneumonia   . Pulmonary nodule     Medications:  Medications Prior to Admission  Medication Sig Dispense Refill Last Dose  . acetaminophen (TYLENOL) 325 MG tablet Take 325 mg by mouth every 6 (six) hours as needed for mild pain.   Past Week at Unknown time  . acetaminophen (TYLENOL) 500 MG tablet Take 1,000 mg by mouth every 6 (six) hours as needed for moderate pain or headache.   Past Week at Unknown time  . albuterol (PROVENTIL) (2.5 MG/3ML) 0.083% nebulizer solution Take 3 mLs (2.5 mg total) by nebulization every 4 (four) hours as needed for shortness of breath. 75 mL 12 01-09-2021 at Unknown time  . ANORO ELLIPTA 62.5-25 MCG/INH AEPB Take 1 puff by mouth daily.   01/09/2021 at Unknown time  . Ascorbic Acid (VITAMIN C) 500 MG CAPS Take 1 capsule by mouth 2 (two) times  daily.   01-09-2021 at Unknown time  . b complex vitamins capsule Take 1 capsule by mouth daily.   01-09-21 at Unknown time  . Cholecalciferol (VITAMIN D PO) Take 800 Units by mouth daily.   Jan 09, 2021 at Unknown time  . Cyanocobalamin (VITAMIN B 12 PO) Take 500 mcg by mouth daily.   Jan 09, 2021 at Unknown time  . insulin aspart (NOVOLOG) 100 UNIT/ML injection Inject 5 Units into the skin 3 (three) times daily.   01/09/21 at Unknown time  . Maltodextrin-Xanthan Gum (RESOURCE THICKENUP CLEAR) POWD Take 1 Container by mouth as needed. 1250 g 0 unk  . mirtazapine (REMERON) 15 MG tablet Take 45 mg by mouth at bedtime.   12/25/2020 at Unknown time  . Multiple Vitamin (MULTIVITAMIN WITH MINERALS) TABS tablet Take 1 tablet by mouth daily. 30 tablet 0 01-09-2021 at Unknown time  . Nutritional Supplements (ENSURE COMPLETE PO) Take 237 mLs by mouth 3 (three) times daily.   12/27/2020 at Unknown time  . Nutritional Supplements (FEEDING SUPPLEMENT, KATE FARMS STANDARD 1.4,) LIQD liquid Take 325 mLs by mouth 3 (  three) times daily between meals. 3250 mL 0 28-Dec-2020 at Unknown time  . OneTouch Delica Lancets 10F MISC CHECK BLOOD SUGAR ONCE A DAY.     Glory Rosebush VERIO test strip      . OVER THE COUNTER MEDICATION Take 2 capsules by mouth daily. Juice plus vitamin   12/27/2020 at Unknown time  . OVER THE COUNTER MEDICATION Take 1 Dose by mouth See admin instructions. Mix with [place], ensure, foods (benecalcium)   12/27/2020 at Unknown time  . pantoprazole (PROTONIX) 40 MG tablet Take 1 tablet (40 mg total) by mouth daily. 30 tablet 0 12-28-2020 at Unknown time  . predniSONE (DELTASONE) 10 MG tablet Take 10 mg by mouth See admin instructions. Qd x 5 days   12-28-20 at Unknown time  . protein supplement (RESOURCE BENEPROTEIN) 6 g POWD Take 1 Scoop (6 g total) by mouth 3 (three) times daily with meals. 100 packet 0 unk   Scheduled:  . arformoterol  15 mcg Nebulization BID  . aspirin  300 mg Rectal Once  . chlorhexidine   15 mL Mouth Rinse BID  . Chlorhexidine Gluconate Cloth  6 each Topical Daily  . hydrocortisone sod succinate (SOLU-CORTEF) inj  50 mg Intravenous Q6H  . insulin aspart  0-9 Units Subcutaneous Q4H  . ipratropium-albuterol  3 mL Nebulization Q6H  . mouth rinse  15 mL Mouth Rinse q12n4p   Infusions:  . vancomycin      Assessment: 75yo male c/o acute on chronic SOB and recent hospitalization for COPD, troponin was found to be elevated >> code STEMI was initially called but decision made to postpone cardiac cath given AKI, now to begin low-dose heparin infusion for ACS.  Of note INR elevated d/t transaminitis, also w/ pancytopenia awaiting bone marrow bx >> high risk of bleeding.  Goal of Therapy:  Heparin level 0.3-0.5 units/ml Monitor platelets by anticoagulation protocol: Yes   Plan:  Will start heparin gtt at 800 units/hr and monitor heparin levels and CBC.  Wynona Neat, PharmD, BCPS  12/27/2020,5:22 AM

## 2020-12-27 NOTE — Progress Notes (Signed)
NAME:  Geoffrey Wood, MRN:  [id number], DOB:  03/28/1946, LOS: 0 ADMISSION DATE:  2020-12-30, CONSULTATION DATE:  12-30-2020 REFERRING MD:  Dr. Laverta Baltimore, CHIEF COMPLAINT:  Dyspnea  Brief History   89yM with CPFE on 4L O2 at home here with acute on chronic hypoxic respiratory failure requiring BiPAP, inferolateral STEMI, AKI  History of present illness   Geoffrey Wood is 67yM with recent admission 4/14 for ILD flare/COPD exac completing slow taper of steroids who presents today to ED for evaluation of rapidly worsening dyspnea. His wife gives most of the history due to his difficulty hearing and work of breathing on BiPAP. He was recovering slowly from recent admission when he and his wife became more concerned this week for constipation or fecal impaction and they ramped up his bowel regimen aggressively including fleets enemas and he had several days of diarrhea. Today though he became abruptly more dyspneic and they called EMS who found him hypoxic and tachypneic. They started him on CPAP with improvement in his work of breathing.   In ED he was found to have acute on chronic hypoxic respiratory failure requiring BiPAP, inferolateral STEMI, he was hypotensive given 1L NS, hyperkalemic given 1 amp bicarb, lokelma in setting of AKI.   Past Medical History  Combined pulmonary fibrosis and emphysema Chronic hypoxic respiratory failure Pancytopenia Right parotid carcinoma PAD Smoking  Significant Hospital Events   n/a  Consults:  Cardiology  Procedures:  None  Significant Diagnostic Tests:  12/27/20 EKG inferolateral STEMI  12-30-2020 CXR with bilateral alveolar and interstitial opacities, similar to prior  Micro Data:  12-30-20 BCx in process  Antimicrobials:  2020/12/30 vanc- Dec 30, 2020 cefepime -   Interim history/subjective:  n/a  Objective   Blood pressure 97/60, pulse 81, temperature (!) 96.6 F (35.9 C), temperature source Axillary, resp. rate (!) 23, height 6' (1.829 m), weight 63  kg, SpO2 100 %.    Vent Mode: BIPAP;PSV FiO2 (%):  [40 %-100 %] 40 % PEEP:  [5 cmH20-6 cmH20] 5 cmH20 Pressure Support:  [3 cmH20] 3 cmH20   Intake/Output Summary (Last 24 hours) at 12/27/2020 0849 Last data filed at 12/27/2020 0800 Gross per 24 hour  Intake 553.95 ml  Output 250 ml  Net 303.95 ml   Filed Weights   12-30-20 2124  Weight: 63 kg    Examination: General: Alert oriented following commands, hard of hearing with hearing aid HENT: History of right parotid surgery, BiPAP mask in place Lungs: Diminished in the bilateral bases,, ventilated breath sounds with BiPAP Cardiovascular: Regular rate rhythm, S1-S2 Abdomen: Soft, nontender nondistended Extremities: Scattered petechia, skin tear on left hand Neuro: Follows commands moves all 4 extremities  Resolved Hospital Problem list   n/a  Assessment & Plan:   ACS, STEMI, hypotension, cardiogenic shock, resolved Right ventricular failure, hepatic congestion -Echocardiogram with worsened RV function. Continue aspirin plus heparin infusion. Observe for any sign of bleeding. Appreciate cardiology input  Acute on chronic hypoxic respiratory failure: Baseline IPF, chronic hypoxemic respiratory failure -De-escalate steroids - Continue NIPPV  AKI Hyperkalemia Suspect from worsening RV dysfunction. Has passive liver congestion with elevated AST ALT. May need diuretics and meds for RV support   Lactic acidosis: poor clearance in setting congestive hepatopathy and low output state. -Follow  Transaminitis -May need to consider right upper quadrant ultrasound if does not improve however I suspect is related to RV dysfunction  Hyperglycemia: serum BHB not impressively elevated. -Increase sliding scale coverage plus CBGs  Pancytopenia: oncology following on outpatient  basis and had planned BMBx next week -Followed by oncology outpatient  DNR  Best practice:  Diet: NPO with sips/meds Pain/Anxiety/Delirium protocol  (if indicated): not indicated VAP protocol (if indicated): not indicated DVT prophylaxis: SCDs pending final cath/AC plan today GI prophylaxis: not indicated Glucose control: SSI Mobility: bed level Code Status: DNR - confirmed with patient's wife today. Although his baseline function is relatively preserved (able to drive himself, etc), given his advanced CPFE, chronic hypoxic respiratory failure, recent R parotid carcinoma/tx, pancytopenia with concern for MDS, felt unlikely to achieve quality of life the patient would find satisfactory in the even that he required intubation for respiratory failure or ACLS for cardiac arrest.   Family Communication: Updated at bedside Disposition: ICU  Labs   CBC: Recent Labs  Lab 12/24/20 1446 12/27/2020 2125 12/27/20 0018  WBC 4.4 14.7*  --   NEUTROABS 3.3 9.6*  --   HGB 9.1* 9.1* 7.1*  HCT 29.1* 32.4* 21.0*  MCV 81.1 90.8  --   PLT 48* 62*  --     Basic Metabolic Panel: Recent Labs  Lab 12/24/20 1446 01/09/2021 2125 12/27/20 0018 12/27/20 0321  NA 130* 131* 132* 130*  K 5.2* 7.0* 5.1 5.4*  CL 94* 92*  --  92*  CO2 28 15*  --  26  GLUCOSE 352* 246*  --  440*  BUN 33* 55*  --  64*  CREATININE 0.99 1.87*  --  1.98*  CALCIUM 9.2 9.1  --  8.7*  PHOS  --   --   --  6.7*   GFR: Estimated Creatinine Clearance: 27.8 mL/min (A) (by C-G formula based on SCr of 1.98 mg/dL (H)). Recent Labs  Lab 12/24/20 1446 12/18/2020 2125 12/25/2020 2315 12/27/20 0321  WBC 4.4 14.7*  --   --   LATICACIDVEN  --   --  10.2* 5.4*    Liver Function Tests: Recent Labs  Lab 12/24/20 1446 12/28/2020 2125 12/27/20 0321  AST 14* 469* 2,338*  ALT 11 367* 1,512*  ALKPHOS 84 82 73  BILITOT 1.7* 5.1* 2.5*  PROT 7.2 6.9 5.9*  ALBUMIN 2.7* 2.6* 2.3*   No results for input(s): LIPASE, AMYLASE in the last 168 hours. No results for input(s): AMMONIA in the last 168 hours.  ABG    Component Value Date/Time   HCO3 25.0 12/27/2020 0333   TCO2 23 12/27/2020 0018    ACIDBASEDEF 0.6 12/27/2020 0333   O2SAT 38.3 12/27/2020 0333     Coagulation Profile: Recent Labs  Lab 12/27/20 0321  INR 1.6*    Cardiac Enzymes: Recent Labs  Lab 01/08/2021 2325  CKTOTAL 26*    HbA1C: Hgb A1c MFr Bld  Date/Time Value Ref Range Status  11/29/2020 01:03 AM 5.5 4.8 - 5.6 % Final    Comment:    (NOTE) Pre diabetes:          5.7%-6.4%  Diabetes:              >6.4%  Glycemic control for   <7.0% adults with diabetes     CBG: Recent Labs  Lab 01/03/2021 2301 12/27/20 0020 12/27/20 0622 12/27/20 0730  GLUCAP 244* 288* 440* 439*    Review of Systems:   unable to obtain in setting increased work of breathing/difficulty hearing  Past Medical History  He,  has a past medical history of Adenomatous colon polyp, Cataract, Diabetes mellitus, Emphysema, Emphysema of lung ([place]), Erectile dysfunction, Hyperlipidemia, Hypertension, Peripheral vascular disease ([place]), Pneumonia, and Pulmonary nodule.  Surgical History    Past Surgical History:  Procedure Laterality Date  . ABDOMINAL AORTIC ANEURYSM REPAIR  2004  . COLONOSCOPY  multiple since 2004     Social History   reports that he quit smoking about 6 years ago. His smoking use included cigarettes. He has a 90.00 pack-year smoking history. He has never used smokeless tobacco. He reports current alcohol use. He reports that he does not use drugs.   Family History   His family history includes Cancer in his father; Diabetes in his mother; Heart failure in his mother. There is no history of Colon cancer, Esophageal cancer, Liver cancer, Pancreatic cancer, Rectal cancer, or Stomach cancer.   Allergies No Known Allergies   Home Medications  Prior to Admission medications   Medication Sig Start Date End Date Taking? Authorizing Provider  acetaminophen (TYLENOL) 325 MG tablet Take 325 mg by mouth every 6 (six) hours as needed for mild pain.    [provider]  albuterol (PROVENTIL) (2.5 MG/3ML)  0.083% nebulizer solution Take 3 mLs (2.5 mg total) by nebulization every 4 (four) hours as needed for shortness of breath. 12/04/20   Sheikh, Omair Latif, DO  ANORO ELLIPTA 62.5-25 MCG/INH AEPB Take 1 puff by mouth daily. 08/20/17   [provider]  Ascorbic Acid (VITAMIN C) 500 MG CAPS Take 1 capsule by mouth 2 (two) times daily.    [provider]  b complex vitamins capsule Take 1 capsule by mouth daily.    [provider]  Cholecalciferol (VITAMIN D PO) Take 800 Units by mouth daily.    [provider]  Cyanocobalamin (VITAMIN B 12 PO) Take 500 mcg by mouth daily.    [provider]  insulin aspart (NOVOLOG) 100 UNIT/ML injection Inject 5 Units into the skin as directed. 2-3 times daily. 12/10/20   [provider]  Maltodextrin-Xanthan Gum ([place]) POWD Take 1 Container by mouth as needed. 12/04/20   Raiford Noble Latif, DO  mirtazapine (REMERON) 15 MG tablet Take 45 mg by mouth at bedtime. 09/03/20   [provider]  Multiple Vitamin (MULTIVITAMIN WITH MINERALS) TABS tablet Take 1 tablet by mouth daily. 12/05/20   Raiford Noble Latif, DO  Nutritional Supplements (FEEDING SUPPLEMENT, KATE FARMS STANDARD 1.4,) LIQD liquid Take 325 mLs by mouth 3 (three) times daily between meals. 12/04/20   Raiford Noble Latif, DO  OneTouch Delica Lancets 29F MISC CHECK BLOOD SUGAR ONCE A DAY. 09/10/16   [provider]  [facility] VERIO test strip  07/12/18   [provider]  pantoprazole (PROTONIX) 40 MG tablet Take 1 tablet (40 mg total) by mouth daily. 12/05/20   Raiford Noble Latif, DO  predniSONE (DELTASONE) 20 MG tablet Take 2 tablets (40 mg total) by mouth daily with breakfast. 12/05/20   Raiford Noble Latif, DO  protein supplement (RESOURCE BENEPROTEIN) 6 g POWD Take 1 Scoop (6 g total) by mouth 3 (three) times daily with meals. 12/04/20   Kerney Elbe, DO   This patient is critically ill with multiple organ  system failure; which, requires frequent high complexity decision making, assessment, support, evaluation, and titration of therapies. This was completed through the application of advanced monitoring technologies and extensive interpretation of multiple databases. During this encounter critical care time was devoted to patient care services described in this note for 32 minutes.    Garner Nash, DO [facility] 12/27/2020 8:49 AM

## 2020-12-27 NOTE — Progress Notes (Addendum)
Pt transported from ED 33 to [id number] with no complications

## 2020-12-27 NOTE — ED Notes (Signed)
Cardiology currently at bedside

## 2020-12-27 NOTE — Progress Notes (Signed)
Pts wife said that pt does not want to wear the BIPAP "He hates it" . RN will continue to monitor. BIPAP SB.

## 2020-12-28 DIAGNOSIS — R579 Shock, unspecified: Secondary | ICD-10-CM | POA: Diagnosis not present

## 2020-12-28 DIAGNOSIS — Z66 Do not resuscitate: Secondary | ICD-10-CM

## 2020-12-28 DIAGNOSIS — Z7189 Other specified counseling: Secondary | ICD-10-CM

## 2020-12-28 DIAGNOSIS — Z515 Encounter for palliative care: Secondary | ICD-10-CM | POA: Diagnosis not present

## 2020-12-28 DIAGNOSIS — J9621 Acute and chronic respiratory failure with hypoxia: Secondary | ICD-10-CM | POA: Diagnosis not present

## 2020-12-28 MED ORDER — HALOPERIDOL LACTATE 5 MG/ML IJ SOLN
0.5000 mg | INTRAMUSCULAR | Status: DC | PRN
  Administered 2020-12-29 (×2): 0.5 mg via INTRAVENOUS
  Filled 2020-12-28 (×2): qty 1

## 2020-12-28 MED ORDER — POLYVINYL ALCOHOL 1.4 % OP SOLN
1.0000 [drp] | Freq: Four times a day (QID) | OPHTHALMIC | Status: DC | PRN
  Filled 2020-12-28: qty 15

## 2020-12-28 MED ORDER — IPRATROPIUM-ALBUTEROL 0.5-2.5 (3) MG/3ML IN SOLN
3.0000 mL | Freq: Three times a day (TID) | RESPIRATORY_TRACT | Status: DC
  Administered 2020-12-28 (×3): 3 mL via RESPIRATORY_TRACT
  Filled 2020-12-28 (×5): qty 3

## 2020-12-28 MED ORDER — LORAZEPAM 2 MG/ML PO CONC: 0.5000 mg | ORAL | Status: DC | PRN

## 2020-12-28 MED ORDER — LORAZEPAM 2 MG/ML IJ SOLN
0.5000 mg | INTRAMUSCULAR | Status: DC | PRN
  Administered 2020-12-29: 0.5 mg via INTRAVENOUS
  Filled 2020-12-28 (×2): qty 1

## 2020-12-28 MED ORDER — GLYCOPYRROLATE 0.2 MG/ML IJ SOLN
0.3000 mg | INTRAMUSCULAR | Status: DC | PRN
  Administered 2020-12-29: 0.3 mg via INTRAVENOUS
  Filled 2020-12-28: qty 2

## 2020-12-28 MED ORDER — MORPHINE SULFATE (CONCENTRATE) 10 MG/0.5ML PO SOLN: 5.0000 mg | ORAL | Status: DC | PRN

## 2020-12-28 MED ORDER — ONDANSETRON HCL 4 MG/2ML IJ SOLN: 4.0000 mg | Freq: Four times a day (QID) | INTRAMUSCULAR | Status: DC | PRN

## 2020-12-28 MED ORDER — BIOTENE DRY MOUTH MT LIQD: 15.0000 mL | OROMUCOSAL | Status: DC | PRN

## 2020-12-28 MED ORDER — ACETAMINOPHEN 325 MG PO TABS: 650.0000 mg | ORAL_TABLET | Freq: Four times a day (QID) | ORAL | Status: DC | PRN

## 2020-12-28 MED ORDER — ACETAMINOPHEN 650 MG RE SUPP: 650.0000 mg | Freq: Four times a day (QID) | RECTAL | Status: DC | PRN

## 2020-12-28 NOTE — Progress Notes (Signed)
Manufacturing engineer [facility])  Received request from [facility] for hospice services at home after discharge.  Chart and pt information under review by [facility] physician.  Hospice eligibility pending at this time.  Hospital liaison spoke at bedside with pt's spouse [name] to initiate education related to hospice philosophy and services and to answer any questions at this time. [name] had visitors at bedside, so discussion today was limited.  [name] did share that she was trying to get things lined up at home, including paid caregivers, and estimated needing a couple of days to get things lined up for a safe discharge. TOC Hassan Rowan made aware.  [name] asked that a liaison visit tomorrow at bedside to continue hospice education.   [place] send signed and completed DNR home with pt/family.  Please provide prescriptions at discharge as needed to ensure ongoing symptom management until pt can be admitted onto hospice services.  Pe discussion pt's spouse requests [facility] be used at time of discharge.    DME needs discussed.   [name] requests a BSC and an overbed table, specifically declines hospital bed based on pt preference.  Address has been verified and is correct in the chart.  ACC information and contact numbers given to [facility].  Above information shared with Marianna Fuss Manager.  Please call with any questions or concerns.  Thank you for the opportunity to participate in this pt's care.  Domenic Moras, BSN, RN [employer] [phone] (24h on call)

## 2020-12-28 NOTE — Consult Note (Signed)
Consultation Note Date: 12/28/2020   Patient Name: Geoffrey Wood  DOB: 11-06-1945  MRN: [id number]  Age / Sex: 75 y.o., male   PCP: Crist Infante, MD Referring Physician: Garner Nash, DO   REASON FOR CONSULTATION:Establishing goals of care  Palliative Care consult requested for goals of care discussion in this 75 y.o. male with a medical history significant for tobacco use, combined pulmonary fibrosis/emphysema, chronic hypoxic respiratory failure, pancytopenia, COPD, right parotid carcinoma (2021) s/p, parotidectomy and radiation, thrombocytopenia, diabetes, hypertension, skin cancer and anemia. Patient presented to the ED from home with concerns for worsening dyspnea. During work-up EKG showed inferolateral STEMI. Required BiPAP for respiratory failure.  Followed by Cardiology and CCM.   Clinical Assessment and Goals of Care: I have reviewed medical records including lab results, imaging, Epic notes, and MAR, received report from the bedside RN, and assessed the patient.   I met at the bedside with patient and his wife, Geoffrey Wood to discuss diagnosis prognosis, [place], EOL wishes, disposition and options.  Geoffrey Wood is resting. Complains of generalized pain. He is extremely hard of hearing.   I introduced Palliative Medicine as specialized medical care for people living with serious illness. It focuses on providing relief from the symptoms and stress of a serious illness. The goal is to improve quality of life for both the patient and the family.  We discussed a brief life review of the patient, along with his functional and nutritional status. Patient and wife have been married for more than 56 years. They have no children. Patient is a retired travel Hotel manager and was working up until his illness in 2021. Patient was very social. He played Poker twice a month, golf 3x/week.   Prior to admission he was ambulatory and able to perform all ADLs independently up until 1-2 weeks  prior to admission. Patient was actively driving. Appetite has been poor but somewhat increased over the months with a 4,000 cal diet to aid in weight gain. Wife unsure of amount of weight loss but states it has been quite significant.   We discussed His current illness and what it means in the larger context of His on-going co-morbidities. Natural disease trajectory and expectations at EOL were discussed.  Family has received extensive updates from attending team. Wife shares patient's quality of life has drastically declined and he has expressed he would not want to live in current state. Decision has been made to focus solely on his comfort and symptom management.   Geoffrey Wood is tearful. She shares she has notified family and all of his Poker/Golf friends of his condition. She states his close friend told her that Geoffrey Wood told him just last week that he felt he did not have much more time and was at peace knowing he was facing end-of-life soon. Wife is tearful expressing he did not give her this warning. Emotional support provided.   Wife confirms DNR/DNI, no medical interventions or further work-up. Palliative comfort care path were discussed at length. Values and goals of care important to patient and family were attempted to be elicited.    Wife is clear in expressed wishes to focus on patient's comfort/EOL, symptom management, with a goal of discharging home with hospice support.   Hospice services outpatient were explained and offered. Wife verbalized understanding and awareness of both hospice's goals and philosophy of care. Wife states she may need bedside commode, bedside table, and home transportation. Education provided on referral process.   Questions  and concerns were addressed.  The family was encouraged to call with questions or concerns.  PMT will continue to support holistically as needed.   CODE STATUS: DNR  ADVANCE DIRECTIVES: Primary Decision Maker: Wife    SYMPTOM  MANAGEMENT: see below   Palliative Prophylaxis:   Aspiration, Bowel Regimen, Eye Care, Frequent Pain Assessment, Oral Care and Turn Reposition  PSYCHO-SOCIAL/SPIRITUAL:  Support System: Family Desire for further Chaplaincy support:No   Additional Recommendations (Limitations, Scope, Preferences):  Full Comfort Care  Education on hospice/palliative    PAST MEDICAL HISTORY: Past Medical History:  Diagnosis Date  . Adenomatous colon polyp   . Cataract   . Diabetes mellitus   . Emphysema   . Emphysema of lung ([place])   . Erectile dysfunction   . Hyperlipidemia   . Hypertension   . Peripheral vascular disease ([place])   . Pneumonia   . Pulmonary nodule     ALLERGIES:  has No Known Allergies.   MEDICATIONS:  Current Facility-Administered Medications  Medication Dose Route Frequency Provider Last Rate Last Admin  . arformoterol (BROVANA) nebulizer solution 15 mcg  15 mcg Nebulization BID Maryjane Hurter, MD   15 mcg at 12/28/20 0721  . Chlorhexidine Gluconate Cloth 2 % PADS 6 each  6 each Topical Daily Maryjane Hurter, MD      . docusate sodium (COLACE) capsule 100 mg  100 mg Oral BID PRN Maryjane Hurter, MD      . ipratropium-albuterol (DUONEB) 0.5-2.5 (3) MG/3ML nebulizer solution 3 mL  3 mL Nebulization TID Icard, Bradley L, DO   3 mL at 12/28/20 0722  . morphine 2 MG/ML injection 2 mg  2 mg Intravenous Q1H PRN Icard, Bradley L, DO      . polyethylene glycol (MIRALAX / GLYCOLAX) packet 17 g  17 g Oral Daily PRN Maryjane Hurter, MD       Facility-Administered Medications Ordered in Other Encounters  Medication Dose Route Frequency Provider Last Rate Last Admin  . 0.9 %  sodium chloride infusion   Intravenous Continuous Magrinat, Virgie Dad, MD 500 mL/hr at 11/26/20 1712 New Bag at 11/26/20 1712  . acetaminophen (TYLENOL) tablet 650 mg  650 mg Oral Once Brunetta Genera, MD      . albuterol (PROVENTIL) (2.5 MG/3ML) 0.083% nebulizer solution 2.5 mg  2.5 mg  Nebulization Q4H PRN Brunetta Genera, MD      . loratadine (CLARITIN) tablet 10 mg  10 mg Oral Once Brunetta Genera, MD        VITAL SIGNS: BP 91/63   Pulse 70   Temp (!) 96.6 F (35.9 C) (Axillary)   Resp (!) 24   Ht 6' (1.829 m)   Wt 63 kg   SpO2 96%   BMI 18.84 kg/m  Filed Weights   01/12/2021 2124  Weight: 63 kg    Estimated body mass index is 18.84 kg/m as calculated from the following:   Height as of this encounter: 6' (1.829 m).   Weight as of this encounter: 63 kg.  LABS: CBC:    Component Value Date/Time   WBC 7.1 12/27/2020 1315   HGB 7.5 (L) 12/27/2020 1315   HGB 9.9 (L) 11/12/2020 1342   HCT 24.6 (L) 12/27/2020 1315   HCT 33.3 (L) 06/18/2020 1508   PLT 36 (L) 12/27/2020 1315   PLT 82 (L) 11/12/2020 1342   Comprehensive Metabolic Panel:    Component Value Date/Time   NA 134 (L) 12/27/2020 1315  K 4.4 12/27/2020 1315   BUN 69 (H) 12/27/2020 1315   CREATININE 1.66 (H) 12/27/2020 1315   CREATININE 0.99 12/24/2020 1446   ALBUMIN 2.4 (L) 12/27/2020 1315     Review of Systems  Musculoskeletal: Positive for arthralgias.  Neurological: Positive for weakness.    Physical Exam General: Appears uncomfortable, frail chronically-ill appearing Cardiovascular: regular rate and rhythm Pulmonary:  diminished bilaterally  Abdomen: soft, nontender, + bowel sounds Skin: scattered bruising/petichia  Neurological: alert, very hard of hearing  Prognosis: POOR in the setting of STEMI, ACS, hypotension, cardiogenic shock, AKI, thrombocytopenia, anemia, weight loss >15%, malnutrition, pancytopenia, parotid cancer s/p radiation, deconditioned, pain, generalized weakness  Discharge Planning:  Home with Hospice  Recommendations: . DNR/DNI-as confirmed by wife . All care to focus on comfort/symptom management . Wife requesting outpatient hospice support at discharge.  Morphine PRN for pain/air hunger/comfort Robinul PRN for excessive secretions Ativan PRN  for agitation/anxiety Zofran PRN for nausea Liquifilm tears PRN for dry eyes Haldol PRN for agitation/anxiety May have comfort feeding Comfort cart for family Unrestricted visitations in the setting of EOL (per policy) Oxygen PRN 2L or less for comfort. No escalation.  Marland Kitchen PMT will continue to support and follow as needed. Please call team line with urgent needs.   Palliative Performance Scale: PPS 20%                Wife and patient expressed understanding and was in agreement with this plan.   Thank you for allowing the Palliative Medicine Team to assist in the care of this patient. Please utilize secure chat with additional questions, if there is no response within 30 minutes please call the above phone number.   Time In: 1005 Time Out: 1115 Time Total: 70 min.   Visit consisted of counseling and education dealing with the complex and emotionally intense issues of symptom management and palliative care in the setting of serious and potentially life-threatening illness.Greater than 50%  of this time was spent counseling and coordinating care related to the above assessment and plan.  Signed by:  Alda Lea, AGPCNP-BC Palliative Medicine Team  Phone: [phone] Pager: [phone] Amion: [place] Team providers are available by phone from 7am to 7pm daily and can be reached through the team cell phone.  Should this patient require assistance outside of these hours, please call the patient's attending physician.

## 2020-12-28 NOTE — Progress Notes (Signed)
NAME:  Geoffrey Wood, MRN:  [id number], DOB:  May 25, 1946, LOS: 1 ADMISSION DATE:  01/04/2021, CONSULTATION DATE:  12/19/2020 REFERRING MD:  Dr. Laverta Baltimore, CHIEF COMPLAINT:  Dyspnea  Brief History   42yM with CPFE on 4L O2 at home here with acute on chronic hypoxic respiratory failure requiring BiPAP, inferolateral STEMI, AKI  History of present illness   [name] is 63yM with recent admission 4/14 for ILD flare/COPD exac completing slow taper of steroids who presents today to ED for evaluation of rapidly worsening dyspnea. His wife gives most of the history due to his difficulty hearing and work of breathing on BiPAP. He was recovering slowly from recent admission when he and his wife became more concerned this week for constipation or fecal impaction and they ramped up his bowel regimen aggressively including fleets enemas and he had several days of diarrhea. Today though he became abruptly more dyspneic and they called EMS who found him hypoxic and tachypneic. They started him on CPAP with improvement in his work of breathing.   In ED he was found to have acute on chronic hypoxic respiratory failure requiring BiPAP, inferolateral STEMI, he was hypotensive given 1L NS, hyperkalemic given 1 amp bicarb, lokelma in setting of AKI.   Past Medical History  Combined pulmonary fibrosis and emphysema Chronic hypoxic respiratory failure Pancytopenia Right parotid carcinoma PAD Smoking  Significant Hospital Events   n/a  Consults:  Cardiology  Procedures:  None  Significant Diagnostic Tests:  12/27/20 EKG inferolateral STEMI  01/12/2021 CXR with bilateral alveolar and interstitial opacities, similar to prior  Micro Data:  12/28/2020 BCx in process  Antimicrobials:  12/25/2020 vanc- 01/10/2021 cefepime -   Interim history/subjective:   Transition to comfort care yesterday.  We will focus solely on this with possibility for transfer to home hospice.  Objective   Blood pressure 91/63, pulse 70,  temperature (!) 96.6 F (35.9 C), temperature source Axillary, resp. rate (!) 24, height 6' (1.829 m), weight 63 kg, SpO2 96 %.    Vent Mode: BIPAP;PSV;Stand-by FiO2 (%):  [40 %] 40 % PEEP:  [5 cmH20] 5 cmH20   Intake/Output Summary (Last 24 hours) at 12/28/2020 0829 Last data filed at 12/28/2020 0200 Gross per 24 hour  Intake 60.73 ml  Output 600 ml  Net -539.27 ml   Filed Weights   12/15/2020 2124  Weight: 63 kg    Examination: General: Alert to voice, hard of hearing HENT: History of right parotid surgery off BiPAP Lungs: Bilateral breath sounds no wheeze Cardiovascular: Regular rate rhythm, S1-S2 Abdomen: Soft nontender nondistended Extremities: Scattered severe petechiae skin tearing and occasional areas of oozing of blood due to low platelets Neuro: Moves all 4 extremities  Resolved Hospital Problem list   n/a  Assessment & Plan:   ACS, STEMI, hypotension, cardiogenic shock, resolved Right ventricular failure, hepatic congestion -Echocardiogram with worsened RV function. P: Patient and family would like to transition home with hospice care   Acute on chronic hypoxic respiratory failure: Baseline IPF, chronic hypoxemic respiratory failure P: Plans to transition home with hospice care  O2 support as needed   AKI Hyperkalemia Suspect from worsening RV dysfunction. Has passive liver congestion with elevated AST ALT. - observe   Lactic acidosis: poor clearance in setting congestive hepatopathy and low output state. -Follow  Transaminitis -May need to consider right upper quadrant ultrasound if does not improve however I suspect is related to RV dysfunction  Hyperglycemia: serum BHB not impressively elevated. - hold checking cbgs  Pancytopenia: oncology following on outpatient basis and had planned BMBx next week - following by oncology op   DNR  Best practice:  Diet: NPO with sips/meds Pain/Anxiety/Delirium protocol (if indicated): not indicated VAP  protocol (if indicated): not indicated DVT prophylaxis: SCDs pending final cath/AC plan today GI prophylaxis: not indicated Glucose control: SSI Mobility: bed level Code Status: DNR, now comfort care, plans to transition home with hospice  Disposition: ICU  Labs   CBC: Recent Labs  Lab 12/24/20 1446 01/02/2021 2125 12/27/20 0018 12/27/20 0809 12/27/20 1315  WBC 4.4 14.7*  --  8.4 7.1  NEUTROABS 3.3 9.6*  --   --   --   HGB 9.1* 9.1* 7.1* 7.5* 7.5*  HCT 29.1* 32.4* 21.0* 24.5* 24.6*  MCV 81.1 90.8  --  82.8 82.6  PLT 48* 62*  --  38* 36*    Basic Metabolic Panel: Recent Labs  Lab 12/24/20 1446 01/02/2021 2125 12/27/20 0018 12/27/20 0321 12/27/20 1315  NA 130* 131* 132* 130* 134*  K 5.2* 7.0* 5.1 5.4* 4.4  CL 94* 92*  --  92* 98  CO2 28 15*  --  26 22  GLUCOSE 352* 246*  --  440* 171*  BUN 33* 55*  --  64* 69*  CREATININE 0.99 1.87*  --  1.98* 1.66*  CALCIUM 9.2 9.1  --  8.7* 8.7*  PHOS  --   --   --  6.7*  --    GFR: Estimated Creatinine Clearance: 33.2 mL/min (A) (by C-G formula based on SCr of 1.66 mg/dL (H)). Recent Labs  Lab 12/24/20 1446 01/12/2021 2125 01/04/2021 2315 12/27/20 0321 12/27/20 0809 12/27/20 1315  WBC 4.4 14.7*  --   --  8.4 7.1  LATICACIDVEN  --   --  10.2* 5.4*  --   --     Liver Function Tests: Recent Labs  Lab 12/24/20 1446 12/24/2020 2125 12/27/20 0321 12/27/20 1315  AST 14* 469* 2,338* 1,721*  ALT 11 367* 1,512* 1,590*  ALKPHOS 84 82 73 73  BILITOT 1.7* 5.1* 2.5* 2.0*  PROT 7.2 6.9 5.9* 6.3*  ALBUMIN 2.7* 2.6* 2.3* 2.4*   No results for input(s): LIPASE, AMYLASE in the last 168 hours. No results for input(s): AMMONIA in the last 168 hours.  ABG    Component Value Date/Time   HCO3 25.0 12/27/2020 0333   TCO2 23 12/27/2020 0018   ACIDBASEDEF 0.6 12/27/2020 0333   O2SAT 38.3 12/27/2020 0333     Coagulation Profile: Recent Labs  Lab 12/27/20 0321  INR 1.6*    Cardiac Enzymes: Recent Labs  Lab 01/07/2021 2325   CKTOTAL 26*    HbA1C: Hgb A1c MFr Bld  Date/Time Value Ref Range Status  11/29/2020 01:03 AM 5.5 4.8 - 5.6 % Final    Comment:    (NOTE) Pre diabetes:          5.7%-6.4%  Diabetes:              >6.4%  Glycemic control for   <7.0% adults with diabetes     CBG: Recent Labs  Lab 12/15/2020 2301 12/27/20 0020 12/27/20 0622 12/27/20 0730 12/27/20 0959  GLUCAP 244* 288* 440* 439* 408*    Review of Systems:   unable to obtain in setting increased work of breathing/difficulty hearing  Past Medical History  He,  has a past medical history of Adenomatous colon polyp, Cataract, Diabetes mellitus, Emphysema, Emphysema of lung ([place]), Erectile dysfunction, Hyperlipidemia, Hypertension, Peripheral vascular disease ([place]), Pneumonia, and  Pulmonary nodule.   Surgical History    Past Surgical History:  Procedure Laterality Date  . ABDOMINAL AORTIC ANEURYSM REPAIR  2004  . COLONOSCOPY  multiple since 2004     Social History   reports that he quit smoking about 6 years ago. His smoking use included cigarettes. He has a 90.00 pack-year smoking history. He has never used smokeless tobacco. He reports current alcohol use. He reports that he does not use drugs.   Family History   His family history includes Cancer in his father; Diabetes in his mother; Heart failure in his mother. There is no history of Colon cancer, Esophageal cancer, Liver cancer, Pancreatic cancer, Rectal cancer, or Stomach cancer.   Allergies No Known Allergies   Home Medications  Prior to Admission medications   Medication Sig Start Date End Date Taking? Authorizing Provider  acetaminophen (TYLENOL) 325 MG tablet Take 325 mg by mouth every 6 (six) hours as needed for mild pain.    [provider]  albuterol (PROVENTIL) (2.5 MG/3ML) 0.083% nebulizer solution Take 3 mLs (2.5 mg total) by nebulization every 4 (four) hours as needed for shortness of breath. 12/04/20   Sheikh, Omair Latif, DO  ANORO ELLIPTA  62.5-25 MCG/INH AEPB Take 1 puff by mouth daily. 08/20/17   [provider]  Ascorbic Acid (VITAMIN C) 500 MG CAPS Take 1 capsule by mouth 2 (two) times daily.    [provider]  b complex vitamins capsule Take 1 capsule by mouth daily.    [provider]  Cholecalciferol (VITAMIN D PO) Take 800 Units by mouth daily.    [provider]  Cyanocobalamin (VITAMIN B 12 PO) Take 500 mcg by mouth daily.    [provider]  insulin aspart (NOVOLOG) 100 UNIT/ML injection Inject 5 Units into the skin as directed. 2-3 times daily. 12/10/20   [provider]  Maltodextrin-Xanthan Gum ([place]) POWD Take 1 Container by mouth as needed. 12/04/20   Raiford Noble Latif, DO  mirtazapine (REMERON) 15 MG tablet Take 45 mg by mouth at bedtime. 09/03/20   [provider]  Multiple Vitamin (MULTIVITAMIN WITH MINERALS) TABS tablet Take 1 tablet by mouth daily. 12/05/20   Raiford Noble Latif, DO  Nutritional Supplements (FEEDING SUPPLEMENT, KATE FARMS STANDARD 1.4,) LIQD liquid Take 325 mLs by mouth 3 (three) times daily between meals. 12/04/20   Raiford Noble Latif, DO  OneTouch Delica Lancets 35K MISC CHECK BLOOD SUGAR ONCE A DAY. 09/10/16   [provider]  [facility] VERIO test strip  07/12/18   [provider]  pantoprazole (PROTONIX) 40 MG tablet Take 1 tablet (40 mg total) by mouth daily. 12/05/20   Raiford Noble Latif, DO  predniSONE (DELTASONE) 20 MG tablet Take 2 tablets (40 mg total) by mouth daily with breakfast. 12/05/20   Raiford Noble Latif, DO  protein supplement (RESOURCE BENEPROTEIN) 6 g POWD Take 1 Scoop (6 g total) by mouth 3 (three) times daily with meals. 12/04/20   Kerney Elbe, DO     [facility] 12/28/2020 8:29 AM

## 2020-12-28 NOTE — TOC Initial Note (Addendum)
Transition of Care [facility]) - Initial/Assessment Note    Patient Details  Name: Geoffrey Wood MRN: [id number] Date of Birth: Jun 01, 1946  Transition of Care [facility]) CM/SW Contact:    Bethena Roys, RN Phone Number: 12/28/2020, 3:48 PM  Clinical Narrative: Case Manager received a consult from Palliative NP regarding home hospice. [facility] was included on the secure chat. [place] spoke with the wife and the wife is agreeable to services. Wife does not want a hospital bed for the patient, she has asked for a bedside commode. Equipment to be delivered on Sunday. Per [place], wife states she can get the patient up. Case Manager sent a secure chat to the MD to get an order for PT/OT to make additional recommendations and suggestions on safe transfers. Patient will likely benefit from PTAR for transport home. Weekend Case Manager to continue to follow for additional transition of care needs.                      Expected Discharge Plan: Home w Hospice Care Barriers to Discharge: Continued Medical Work up   Patient Goals and CMS Choice Patient states their goals for this hospitalization and ongoing recovery are:: to return home with spouse with Hospice Services   Choice offered to / list presented to :  (Palliative discussed with patient and [place] was called.)  Expected Discharge Plan and Services Expected Discharge Plan: Home w Hospice Care In-house Referral: Hospice / Palliative Care Discharge Planning Services: CM Consult Post Acute Care Choice: Hospice Living arrangements for the past 2 months: Single Family Home                 DME Arranged:  ([facility] to service the patient with equipment. BSC and no hospital bed- per hospice patient does not want a bed will sleep on the couch.) DME Agency: Hospice and [place]: RN [facility] Agency: [place]: [place] Representative  spoke with at [place]: [place] Arrangements/Services Living arrangements for the past 2 months: [place] with:: Self,Spouse Patient language and need for interpreter reviewed:: Yes Do you feel safe going back to the place where you live?: Yes      Need for Family Participation in Patient Care: Yes (Comment) Care giver support system in place?: Yes (comment)   Criminal Activity/Legal Involvement Pertinent to Current Situation/Hospitalization: No - Comment as needed  Activities of Daily Living Home Assistive Devices/Equipment: None ADL Screening (condition at time of admission) Patient's cognitive ability adequate to safely complete daily activities?: Yes Is the patient deaf or have difficulty hearing?: Yes Does the patient have difficulty seeing, even when wearing glasses/contacts?: No Does the patient have difficulty concentrating, remembering, or making decisions?: No Patient able to express need for assistance with ADLs?: Yes Does the patient have difficulty dressing or bathing?: Yes Independently performs ADLs?: No Does the patient have difficulty walking or climbing stairs?: Yes Weakness of Legs: Both Weakness of Arms/Hands: Both  Permission Sought/Granted Permission sought to share information with : Case Manager,Family Supports       Permission granted to share info w AGENCY: Manufacturing engineer        Emotional Assessment         Alcohol / Substance Use: Not Applicable Psych Involvement: No (comment)  Admission diagnosis:  Hyperkalemia [E87.5] Shock ([place]) [R57.9] Acute respiratory failure with  hypoxia ([place]) [J96.01] Acute on chronic respiratory failure with hypoxia ([place]) [J96.21] Patient Active Problem List   Diagnosis Date Noted  . Acute on chronic respiratory failure with hypoxia ([place]) 12/27/2020  . Pressure injury of skin 12/27/2020  . Shock ([place])   . Bilateral lower extremity edema 12/11/2020  . Chronic kidney disease  due to hypertension 12/11/2020  . Hyperbilirubinemia 12/11/2020  . Hypoxia 12/11/2020  . Idiopathic hypotension 12/11/2020  . Secondary malignant neoplasm of other specified sites ([place]) 12/11/2020  . Shortness of breath 12/11/2020  . Squamous cell carcinoma of skin of face 12/11/2020  . Thrombocytopenia ([place]) 12/11/2020  . Tobacco use disorder 12/11/2020  . Chronic respiratory failure with hypoxia ([place]) 12/11/2020  . Dysphagia 12/11/2020  . Protein-calorie malnutrition, severe 11/30/2020  . COPD exacerbation ([place]) 11/29/2020  . CAP (community acquired pneumonia) 11/29/2020  . Cellulitis 11/29/2020  . Anemia 11/12/2020  . Iron deficiency anemia 11/12/2020  . IPF (idiopathic pulmonary fibrosis) ([place]) 09/30/2020  . 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 Malignant neoplasm of parotid gland ([place]) 07/05/2020  . 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 Paralytic ectropion of right lower eyelid 06/25/2020  . Paralytic lagophthalmos of right upper eyelid 06/25/2020  . Personal history of surgery to heart and great vessels, presenting hazards to health 06/25/2020  . Abnormal weight loss 02/13/2020  . Coronary artery calcification 11/16/2018  . 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 Chest pain 12/04/2016  . Dilatation of aorta ([facility]) 07/28/2016  . Diabetic renal disease ([place]) 04/10/2016  . Proteinuria 04/10/2016  . Non-thrombocytopenic purpura ([place]) 03/14/2015  . Pulmonary nodules 01/22/2014  . Lung field abnormal 09/05/2013  . ED (erectile dysfunction) of organic origin 02/23/2013  . Personal history of adenomatous colonic polyps 12/24/2011  . HTN (hypertension) 04/21/2011  . Hyperlipidemia 04/21/2011  . B12 deficiency 09/20/2009  . Calculus of kidney 09/05/2009  . Other intervertebral disc degeneration, lumbar region 09/05/2009  . Type 2 diabetes mellitus without complication ([place]) 02/40/9735  . Unspecified hearing loss, unspecified ear 09/05/2009  .  Unspecified osteoarthritis, unspecified site 09/05/2009   PCP:  Crist Infante, MD Pharmacy:   [id number] - [place], [address] AT [address] [place] [address] Phone: [phone] Fax: [phone]    Readmission Risk Interventions No flowsheet data found.

## 2020-12-29 DIAGNOSIS — Z515 Encounter for palliative care: Secondary | ICD-10-CM

## 2021-01-01 LAB — CULTURE, BLOOD (ROUTINE X 2)
Culture: NO GROWTH
Culture: NO GROWTH
Special Requests: ADEQUATE
Special Requests: ADEQUATE

## 2021-01-07 ENCOUNTER — Ambulatory Visit: Payer: Medicare Other | Admitting: Hematology

## 2021-01-08 ENCOUNTER — Ambulatory Visit: Payer: Medicare Other | Admitting: Adult Health

## 2021-01-14 ENCOUNTER — Ambulatory Visit: Payer: Medicare Other | Admitting: Hematology

## 2021-01-14 ENCOUNTER — Other Ambulatory Visit: Payer: Medicare Other

## 2021-01-15 NOTE — Progress Notes (Signed)
OT Cancellation Note  Patient Details Name: Geoffrey Wood MRN: [id number] DOB: 1945-08-29   Cancelled Treatment:    Reason Eval/Treat Not Completed: Other (comment) (Pt to go to inpatient hospice at [place].) OT will sign off at this time. Please feel free to re-consult if discharge plans change or MD sees need for OT evaluation.   [facility] 2021-01-16, 11:27 AM   Jesse Sans OTR/L Acute Rehabilitation Services Pager: [phone] Office: [phone]

## 2021-01-15 NOTE — Progress Notes (Addendum)
Manufacturing engineer [facility])  Referral received for residential hospice at [facility].  Met with patient and his brother in law at the bedside. His wife had returned home to rest. Brother in law advised that he was in close contact with his sister and was able to provide information if needed. Discussed residential hospice, family is interested.  There is not a bed today, [facility] will follow up with hospital and family once a bed open.   Venia Carbon RN, BSN, [facility] Liaison

## 2021-01-15 NOTE — Progress Notes (Signed)
PROGRESS NOTE    Geoffrey Wood  QVZ:[id number] DOB: October 31, 1945 DOA: 12/16/2020 PCP: Crist Infante, MD    Brief Narrative:  75 year old gentleman with history of chronic combined pulmonary fibrosis and emphysema and chronic hypoxic respiratory failure on 4 L oxygen at home, ongoing progressive debility and shortness of breath presented to the emergency room on 5/12 with worsening shortness of breath, acute on chronic hypoxic respiratory failure requiring BiPAP, non-STEMI and acute kidney injury and admitted to intensive care unit on BiPAP.  He had also recently suffered from Malcolm.  In the emergency room, he was found to be in respiratory distress requiring BiPAP, anterolateral STEMI, hypotensive and was given a 1 L fluid, hyperkalemic in the setting of AKI.  5/12, admitted with respiratory distress on BiPAP.  Suffered from inferolateral STEMI on 5/13. 5/13, due to acute decompensation, hypotension, worsening kidney functions no urine output and severe comorbidities with no chance of recovery, comfort care started. Patient is provided end-of-life care in the hospital. Initial plan was to transfer home with hospice, however his demands will not be fulfilled at home by the spouse, he will need to go to inpatient hospice.   Assessment & Plan:   Principal Problem:   End of life care Active Problems:   Acute on chronic respiratory failure with hypoxia (HCC)   Pressure injury of skin   Shock ([place])  - Acute coronary syndrome, inferolateral STEMI, hypertension and cardiogenic shock: - Acute on chronic hypoxic respiratory failure with underlying history of chronic combined pulmonary fibrosis and emphysema - Acute kidney injury and hyperkalemia -Chronic pancytopenia -Failure to thrive, severe frailty and debility and acute metabolic encephalopathy:  Plan: With poor chances of recovery and suffering, comfort care and hospice pathway started. Continue to provide end-of-life care All symptom  control medications are available including opiates, Ativan, haloperidol, oxygen, bowel regimen. Unrestricted visitor policy. RN to pronounce death if happens in the hospital.  Patient has severe symptoms that cannot be managed at home by the spouse, he is nearing end-of-life.  He will be best served at inpatient hospice facility.  Transfer when bed is available.   DVT prophylaxis: Comfort care   Code Status: Comfort care Family Communication: Wife at the bedside.  Brother-in-law at bedside. Disposition Plan: Status is: Inpatient  Remains inpatient appropriate because:Unsafe d/c plan   Dispo: The patient is from: Home              Anticipated d/c is to: Inpatient hospice              Patient currently is not medically stable to d/c.  Only stable to discharge to hospice level of care.   Difficult to place patient No         Consultants:   Critical care  Palliative  Procedures:   None  Antimicrobials:   None   Subjective: Patient seen and examined.  He was unable to wake up to a strong voice and did not communicate.  He did not open his eyes. Was sleepy with irregular shallow breathing. Wife was sleeping at the bedside and we discussed about overnight events. She tells me that he gets very agitated and anxious when the morphine wears off.  Objective: Vitals:   12/28/20 2216 12/28/20 2300 12/31/2020 0000 12/23/2020 0104  BP: (!) 84/53   98/63  Pulse: 71 77 78 76  Resp: 19 (!) 21 (!) 27 (!) 28  Temp:    97.8 F (36.6 C)  TempSrc:    Axillary  SpO2: 92% 96% (!) 86% (!) 89%  Weight:      Height:        Intake/Output Summary (Last 24 hours) at Jan 22, 2021 1141 Last data filed at Jan 22, 2021 0947 Gross per 24 hour  Intake 480 ml  Output 775 ml  Net -295 ml   Filed Weights   01/05/2021 2124  Weight: 63 kg    Examination:  General: Frail and debilitated, sick looking.  On oxygen for comfort. Irregular shallow breathing, intermittently stopping. Sleepy and  lethargic.  Unable to interact. Cardiovascular: S1-S2 normal.  Tachycardic. Respiratory: Mostly conducted airway sounds. Gastrointestinal: Soft and nontender. Ext: Severe potentia skin tearing and areas of oozing.  Data Reviewed: I have personally reviewed following labs and imaging studies  CBC: Recent Labs  Lab 12/24/20 1446 12/30/2020 2125 12/27/20 0018 12/27/20 0809 12/27/20 1315  WBC 4.4 14.7*  --  8.4 7.1  NEUTROABS 3.3 9.6*  --   --   --   HGB 9.1* 9.1* 7.1* 7.5* 7.5*  HCT 29.1* 32.4* 21.0* 24.5* 24.6*  MCV 81.1 90.8  --  82.8 82.6  PLT 48* 62*  --  38* 36*   Basic Metabolic Panel: Recent Labs  Lab 12/24/20 1446 01/06/2021 2125 12/27/20 0018 12/27/20 0321 12/27/20 1315  NA 130* 131* 132* 130* 134*  K 5.2* 7.0* 5.1 5.4* 4.4  CL 94* 92*  --  92* 98  CO2 28 15*  --  26 22  GLUCOSE 352* 246*  --  440* 171*  BUN 33* 55*  --  64* 69*  CREATININE 0.99 1.87*  --  1.98* 1.66*  CALCIUM 9.2 9.1  --  8.7* 8.7*  PHOS  --   --   --  6.7*  --    GFR: Estimated Creatinine Clearance: 33.2 mL/min (A) (by C-G formula based on SCr of 1.66 mg/dL (H)). Liver Function Tests: Recent Labs  Lab 12/24/20 1446 01/06/2021 2125 12/27/20 0321 12/27/20 1315  AST 14* 469* 2,338* 1,721*  ALT 11 367* 1,512* 1,590*  ALKPHOS 84 82 73 73  BILITOT 1.7* 5.1* 2.5* 2.0*  PROT 7.2 6.9 5.9* 6.3*  ALBUMIN 2.7* 2.6* 2.3* 2.4*   No results for input(s): LIPASE, AMYLASE in the last 168 hours. No results for input(s): AMMONIA in the last 168 hours. Coagulation Profile: Recent Labs  Lab 12/27/20 0321  INR 1.6*   Cardiac Enzymes: Recent Labs  Lab 01/09/2021 2325  CKTOTAL 26*   BNP (last 3 results) No results for input(s): PROBNP in the last 8760 hours. HbA1C: No results for input(s): HGBA1C in the last 72 hours. CBG: Recent Labs  Lab 01/14/2021 2301 12/27/20 0020 12/27/20 0622 12/27/20 0730 12/27/20 0959  GLUCAP 244* 288* 440* 439* 408*   Lipid Profile: No results for input(s): CHOL,  HDL, LDLCALC, TRIG, CHOLHDL, LDLDIRECT in the last 72 hours. Thyroid Function Tests: No results for input(s): TSH, T4TOTAL, FREET4, T3FREE, THYROIDAB in the last 72 hours. Anemia Panel: No results for input(s): VITAMINB12, FOLATE, FERRITIN, TIBC, IRON, RETICCTPCT in the last 72 hours. Sepsis Labs: Recent Labs  Lab 12/25/2020 2315 12/27/20 0321  LATICACIDVEN 10.2* 5.4*    Recent Results (from the past 240 hour(s))  Resp Panel by RT-PCR (Flu A&B, Covid) Nasopharyngeal Swab     Status: None   Collection Time: 12/25/2020  9:48 PM   Specimen: Nasopharyngeal Swab; Nasopharyngeal(NP) swabs in vial transport medium  Result Value Ref Range Status   SARS Coronavirus 2 by RT PCR NEGATIVE NEGATIVE Final    Comment: (  NOTE) SARS-CoV-2 target nucleic acids are NOT DETECTED.  The SARS-CoV-2 RNA is generally detectable in upper respiratory specimens during the acute phase of infection. The lowest concentration of SARS-CoV-2 viral copies this assay can detect is 138 copies/mL. A negative result does not preclude SARS-Cov-2 infection and should not be used as the sole basis for treatment or other patient management decisions. A negative result may occur with  improper specimen collection/handling, submission of specimen other than nasopharyngeal swab, presence of viral mutation(s) within the areas targeted by this assay, and inadequate number of viral copies(<138 copies/mL). A negative result must be combined with clinical observations, patient history, and epidemiological information. The expected result is Negative.  Fact Sheet for Patients:  EntrepreneurPulse.com.au  Fact Sheet for Healthcare Providers:  IncredibleEmployment.be  This test is no t yet approved or cleared by the [place] FDA and  has been authorized for detection and/or diagnosis of SARS-CoV-2 by FDA under an Emergency Use Authorization (EUA). This EUA will remain  in effect (meaning this  test can be used) for the duration of the COVID-19 declaration under Section 564(b)(1) of the Act, 21 U.S.C.section 360bbb-3(b)(1), unless the authorization is terminated  or revoked sooner.       Influenza A by PCR NEGATIVE NEGATIVE Final   Influenza B by PCR NEGATIVE NEGATIVE Final    Comment: (NOTE) The Xpert Xpress SARS-CoV-2/FLU/RSV plus assay is intended as an aid in the diagnosis of influenza from Nasopharyngeal swab specimens and should not be used as a sole basis for treatment. Nasal washings and aspirates are unacceptable for Xpert Xpress SARS-CoV-2/FLU/RSV testing.  Fact Sheet for Patients: EntrepreneurPulse.com.au  Fact Sheet for Healthcare Providers: IncredibleEmployment.be  This test is not yet approved or cleared by the [place] FDA and has been authorized for detection and/or diagnosis of SARS-CoV-2 by FDA under an Emergency Use Authorization (EUA). This EUA will remain in effect (meaning this test can be used) for the duration of the COVID-19 declaration under Section 564(b)(1) of the Act, 21 U.S.C. section 360bbb-3(b)(1), unless the authorization is terminated or revoked.  Performed at [facility], [place] [address] [place], [place] [address]   Culture, blood (routine x 2)     Status: None (Preliminary result)   Collection Time: 12/27/20 12:05 AM   Specimen: BLOOD  Result Value Ref Range Status   Specimen Description BLOOD RIGHT ARM  Final   Special Requests   Final    BOTTLES DRAWN AEROBIC AND ANAEROBIC Blood Culture adequate volume   Culture   Final    NO GROWTH 2 DAYS Performed at [facility], [place] [address], [place], [place] [address]    Report Status PENDING  Incomplete  MRSA PCR Screening     Status: None   Collection Time: 12/27/20  2:37 AM   Specimen: Nasal Mucosa; Nasopharyngeal  Result Value Ref Range Status   MRSA by PCR NEGATIVE NEGATIVE Final    Comment:        The GeneXpert MRSA  Assay (FDA approved for NASAL specimens only), is one component of a comprehensive MRSA colonization surveillance program. It is not intended to diagnose MRSA infection nor to guide or monitor treatment for MRSA infections. Performed at [facility], [place] [address] [place], [place] [address]   Culture, blood (routine x 2)     Status: None (Preliminary result)   Collection Time: 12/27/20  3:21 AM   Specimen: BLOOD  Result Value Ref Range Status   Specimen Description  BLOOD RIGHT ANTECUBITAL  Final   Special Requests   Final    BOTTLES DRAWN AEROBIC AND ANAEROBIC Blood Culture adequate volume   Culture   Final    NO GROWTH 2 DAYS Performed at [facility], [address] [place], [place] [address]    Report Status PENDING  Incomplete         Radiology Studies: No results found.      Scheduled Meds: Continuous Infusions:   LOS: 2 days    Time spent: 35 minutes    Barb Merino, MD [facility] Pager [phone]

## 2021-01-15 NOTE — Death Summary Note (Signed)
DEATH SUMMARY   Patient Details  Name: Geoffrey Wood MRN: [id number] DOB: June 09, 1946  Admission/Discharge Information   Admit Date:  01-Jan-2021  Date of Death: Date of Death: 01/04/2021  Time of Death: Time of Death: 35  Length of Stay: 2  Referring Physician: Crist Infante, MD   Reason(s) for Hospitalization  Shortness of breath   Diagnoses  Preliminary cause of death:  Secondary Diagnoses (including complications and co-morbidities):  Principal Problem:   End of life care Active Problems:   Acute on chronic respiratory failure with hypoxia ([place])   Pressure injury of skin   [facility])   Brief Hospital Course (including significant findings, care, treatment, and services provided and events leading to death)  Geoffrey Wood is a 75 y.o. year old male who has history of chronic combined pulmonary fibrosis and emphysema and chronic hypoxic respiratory failure on 4 L oxygen at home, ongoing progressive debility and shortness of breath presented to the emergency room on 01-02-2023 with worsening shortness of breath, acute on chronic hypoxic respiratory failure requiring BiPAP, non-STEMI and acute kidney injury and admitted to intensive care unit on BiPAP.  He had also recently suffered from [place].  In the emergency room, he was found to be in respiratory distress requiring BiPAP, anterolateral STEMI, hypotensive and was given a 1 L fluid, hyperkalemic in the setting of AKI.  2023/01/02, admitted with respiratory distress on BiPAP.  Suffered from inferolateral STEMI on 5/13. 5/13, due to acute decompensation, hypotension, worsening kidney functions no urine output and severe comorbidities with no chance of recovery, comfort care started. Patient is provided end-of-life care in the hospital. Patient was waiting to transfer to inpatient hospice bed, however he died in the hospital 01-04-2021 at 1350 with family at the bedside.  - Acute on chronic hypoxic respiratory failure with underlying history  of chronic combined pulmonary fibrosis and emphysema - Acute coronary syndrome, inferolateral STEMI, hypertension and cardiogenic shock: - Acute kidney injury and hyperkalemia -Chronic pancytopenia -Failure to thrive, severe frailty and debility and acute metabolic encephalopathy:    Pertinent Labs and Studies  Significant Diagnostic Studies CT Soft Tissue Neck W Contrast  Result Date: 12/05/2020 CLINICAL DATA:  Carcinoma of the right parotid gland EXAM: CT NECK WITH CONTRAST TECHNIQUE: Multidetector CT imaging of the neck was performed using the standard protocol following the bolus administration of intravenous contrast. CONTRAST:  75mL OMNIPAQUE IOHEXOL 300 MG/ML  SOLN COMPARISON:  None. FINDINGS: PHARYNX AND LARYNX: The nasopharynx, oropharynx and larynx are normal. Visible portions of the oral cavity, tongue base and floor of mouth are normal. Normal epiglottis, vallecula and pyriform sinuses. The larynx is normal. No retropharyngeal abscess, effusion or lymphadenopathy. 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 The region of the right parotid gland is obscured by streak artifact from dental amalgam but there is no visible soft tissue mass. Left parotid gland is normal. Normal submandibular glands. THYROID: Normal. LYMPH NODES: No enlarged or abnormal density lymph nodes. VASCULAR: Calcific atherosclerosis of the aorta and the carotid bifurcations LIMITED INTRACRANIAL: Normal VISUALIZED ORBITS: Metallic eyelid implant on the right. MASTOIDS AND VISUALIZED PARANASAL SINUSES: No fluid levels or advanced mucosal thickening. No mastoid effusion. SKELETON: No bony spinal canal stenosis. No lytic or blastic lesions. UPPER CHEST: Severe biapical emphysema. OTHER: None. IMPRESSION: 1. Status post right parotid gland resection. The region of the right parotid gland is obscured by streak artifact from dental amalgam but there is no visible soft tissue mass. 2. No cervical lymphadenopathy. Aortic  Atherosclerosis (ICD10-I70.0) and Emphysema (ICD10-J43.9). Electronically Signed   By: Ulyses Jarred M.D.   On: 12/05/2020 19:20   DG Chest Portable 1 View  Result Date: 12/23/2020 CLINICAL DATA:  Shortness of breath EXAM: PORTABLE CHEST 1 VIEW COMPARISON:  11/28/2020 FINDINGS: Cardiac shadow is stable. Diffuse fibrotic changes are again identified throughout both lungs. No focal confluent infiltrate or effusion is seen. Bony structures are within normal limits. IMPRESSION: Chronic fibrotic changes bilaterally.  No acute abnormality noted. Electronically Signed   By: Inez Catalina M.D.   On: 12/17/2020 22:25   DG Swallowing Func-Speech Pathology  Result Date: 11/30/2020 Objective Swallowing Evaluation: Type of Study: MBS-Modified Barium Swallow Study  Patient Details Name: Geoffrey Wood MRN: [id number] Date of Birth: 04/22/1946 Today's Date: 11/30/2020 Time: SLP Start Time (ACUTE ONLY): 1045 -SLP Stop Time (ACUTE ONLY): 1115 SLP Time Calculation (min) (ACUTE ONLY): 30 min Past Medical History: Past Medical History: Diagnosis Date . Adenomatous colon polyp  . Cataract  . Diabetes mellitus  . Emphysema  . Emphysema of lung ([place])  . Erectile dysfunction  . Hyperlipidemia  . Hypertension  . Peripheral vascular disease ([place])  . Pneumonia  . Pulmonary nodule  Past Surgical History: Past Surgical History: Procedure Laterality Date . ABDOMINAL AORTIC ANEURYSM REPAIR  2004 . COLONOSCOPY  multiple since 2004 HPI: Geoffrey Wood is a 75 y.o. male with a history of COPD, IPF recently started on 4L O2 at home, [facility] of right parotid s/p excision with chronic wound, ITP, HTN, and hard of hearing who presented to the ED late 4/14 for progressive shortness of breath and hypoxia to 70-80%'s despite starting 4L O2 at home. CT Chest shows "Severe centrilobular and paraseptal emphysema, more  severe within the apices bilaterally. Since the prior examination,  there has developed extensive bibasilar ground-glass pulmonary   infiltrate and pulmonary consolidation, more severe within the right  lower lobe. Small right pleural effusion has developed. 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 On 06-03-20 Dr. Nicolette Bang performed rt parotidectomy with facial nerve dissection which ultimately req'd sacrifice of upper division of rt facial nerve. Rt selective neck dissection also that date.  Pt had three sessions of OP SLP therapy due to facial nerve paresis. Per ENT "He also recieved postoperative radiation therapy but did not f/u with SLP therapy after that.  Since that time he is always had some hearing problems and wears hearing aids but his hearing in his right ear has been worse.  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  No data recorded Assessment / Plan / Recommendation CHL IP CLINICAL IMPRESSIONS 11/30/2020 Clinical Impression Pt demonstrates a mild to moderate dysphagia with trace to mild sensed aspiration events that now pt reports are new over the past few days.  Oral deficits include anterior loss during chin tuck, base of tongue weakness, likely on the right leading to mild premature spillage during bolus formation with trace penetration events before the swallow and one instance of significant aspiration with hard cough. Pt also has decreased laryngeal closure during the swallow though epiglottic deflection and laryngal elevation with glottic clousre are quite complete and sustained in many attempts. Suspect decreased seal of arytenoid tissue leading to penetration during the swallow  and eventual trace aspiration as glottic closure is released. Pt typically senses this with a throat clear or cough, though not all aspirate is  cleared. Nectar thick liquids and puree are tolerated without aspiration and a cue for an effortful swallow eliminates mild vallecular residue that was otherwise present. Generally, this dysphagia is mild and possibly temporary if pt can improve conditioning and general strength and pulmonary hygiene. Recommend puree (pts baseline as he does not masticate solids since surgery)and nectar thick liquids during acute phase of rehabilitation. Will benefit from RMST, training for preventative cough strategy and strengthning for increased laryngeal closure as well as preventative oral hygiene measures. SLP Visit Diagnosis Dysphagia, oropharyngeal phase (R13.12) Attention and concentration deficit following -- Frontal lobe and executive function deficit following -- Impact on safety and function Moderate aspiration risk;Risk for inadequate nutrition/hydration   CHL IP TREATMENT RECOMMENDATION 11/30/2020 Treatment Recommendations Therapy as outlined in treatment plan below   Prognosis 11/30/2020 Prognosis for Safe Diet Advancement Good Barriers to Reach Goals -- Barriers/Prognosis Comment -- CHL IP DIET RECOMMENDATION 11/30/2020 SLP Diet Recommendations Dysphagia 1 (Puree) solids;Nectar thick liquid Liquid Administration via Straw Medication Administration Whole meds with liquid Compensations Slow rate;Small sips/bites;Effortful swallow;Clear throat after each swallow Postural Changes --   No flowsheet data found.  CHL IP FOLLOW UP RECOMMENDATIONS 11/30/2020 Follow up Recommendations Inpatient Rehab   CHL IP FREQUENCY AND DURATION 11/30/2020 Speech Therapy Frequency (ACUTE ONLY) min 2x/week Treatment Duration 2 weeks      CHL IP ORAL PHASE 11/30/2020 Oral Phase Impaired Oral - Pudding Teaspoon -- Oral - Pudding Cup -- Oral - Honey Teaspoon -- Oral - Honey Cup -- Oral - Nectar Teaspoon -- Oral - Nectar Cup -- Oral - Nectar Straw Premature spillage Oral - Thin Teaspoon -- Oral - Thin Cup -- Oral - Thin Straw Premature spillage;Right  anterior bolus loss Oral - Puree WFL Oral - Mech Soft -- Oral - Regular -- Oral - Multi-Consistency -- Oral - Pill -- Oral Phase - Comment --  CHL IP PHARYNGEAL PHASE 11/30/2020 Pharyngeal Phase Impaired Pharyngeal- Pudding Teaspoon -- Pharyngeal -- Pharyngeal- Pudding Cup -- Pharyngeal -- Pharyngeal- Honey Teaspoon -- Pharyngeal -- Pharyngeal- Honey Cup -- Pharyngeal -- Pharyngeal- Nectar Teaspoon -- Pharyngeal -- Pharyngeal- Nectar Cup -- Pharyngeal -- Pharyngeal- Nectar Straw Reduced epiglottic inversion;Reduced tongue base retraction;Pharyngeal residue - valleculae Pharyngeal -- Pharyngeal- Thin Teaspoon -- Pharyngeal -- Pharyngeal- Thin Cup -- Pharyngeal -- Pharyngeal- Thin Straw Trace aspiration;Penetration/Aspiration before swallow;Penetration/Aspiration during swallow;Reduced airway/laryngeal closure;Reduced epiglottic inversion Pharyngeal -- Pharyngeal- Puree Pharyngeal residue - valleculae Pharyngeal -- Pharyngeal- Mechanical Soft -- Pharyngeal -- Pharyngeal- Regular -- Pharyngeal -- Pharyngeal- Multi-consistency -- Pharyngeal -- Pharyngeal- Pill -- Pharyngeal -- Pharyngeal Comment --  No flowsheet data found. DeBlois, Katherene Ponto 11/30/2020, 11:55 AM              ECHOCARDIOGRAM COMPLETE  Result Date: 12/27/2020    ECHOCARDIOGRAM REPORT   Patient Name:   Geoffrey Wood Date of Exam: 12/27/2020 Medical Rec #:  [id number]       Height:       72.0 in Accession #:    [id number]      Weight:       138.9 lb Date of Birth:  1945/10/30        BSA:          1.825 m Patient Age:    42 years        BP:  91/64 mmHg Patient Gender: M               HR:           86 bpm. Exam Location:  Inpatient Procedure: 2D Echo, Cardiac Doppler, Color Doppler, 3D Echo and Strain Analysis Indications:    Elevated Troponin  History:        Patient has prior history of Echocardiogram examinations, most                 recent 11/29/2020. PAD; Risk Factors:Hypertension, Dyslipidemia                 and Diabetes. Emphysema of  lung.  Sonographer:    Darlina Sicilian RDCS Referring Phys: [id number] [place]  1. Left ventricular ejection fraction, by estimation, is 40 to 45%. The left ventricle has mildly decreased function. The left ventricle demonstrates regional wall motion abnormalities (see scoring diagram/findings for description). Left ventricular diastolic parameters are consistent with Grade I diastolic dysfunction (impaired relaxation). There is the interventricular septum is flattened in systole and diastole, consistent with right ventricular pressure and volume overload. There is hypokinesis of the left ventricular, mid-apical inferior wall.  2. Right ventricular systolic function is severely reduced. The right ventricular size is severely enlarged. There is normal pulmonary artery systolic pressure.  3. Left atrial size was mild to moderately dilated.  4. Right atrial size was moderately dilated.  5. The mitral valve is normal in structure. Trivial mitral valve regurgitation. No evidence of mitral stenosis.  6. Tricuspid valve regurgitation is severe.  7. The aortic valve is normal in structure. There is mild calcification of the aortic valve. Aortic valve regurgitation is trivial. No aortic stenosis is present.  8. Aortic dilatation noted. There is mild dilatation of the aortic root, measuring 39 mm. There is mild dilatation of the ascending aorta, measuring 40 mm.  9. The inferior vena cava is dilated in size with <50% respiratory variability, suggesting right atrial pressure of 15 mmHg. FINDINGS  Left Ventricle: Left ventricular ejection fraction, by estimation, is 40 to 45%. The left ventricle has mildly decreased function. The left ventricle demonstrates regional wall motion abnormalities. The left ventricular internal cavity size was normal in size. There is no left ventricular hypertrophy. The interventricular septum is flattened in systole and diastole, consistent with right ventricular pressure and  volume overload. Left ventricular diastolic parameters are consistent with Grade I diastolic dysfunction (impaired relaxation). Right Ventricle: The right ventricular size is severely enlarged. No increase in right ventricular wall thickness. Right ventricular systolic function is severely reduced. There is normal pulmonary artery systolic pressure. The tricuspid regurgitant velocity is 2.21 m/s, and with an assumed right atrial pressure of 15 mmHg, the estimated right ventricular systolic pressure is [id number] mmHg. Left Atrium: Left atrial size was mild to moderately dilated. Right Atrium: Right atrial size was moderately dilated. Pericardium: There is no evidence of pericardial effusion. Mitral Valve: The mitral valve is normal in structure. Mild mitral annular calcification. Trivial mitral valve regurgitation. No evidence of mitral valve stenosis. Tricuspid Valve: The tricuspid valve is normal in structure. Tricuspid valve regurgitation is severe. No evidence of tricuspid stenosis. Aortic Valve: The aortic valve is normal in structure. There is mild calcification of the aortic valve. Aortic valve regurgitation is trivial. No aortic stenosis is present. Pulmonic Valve: The pulmonic valve was normal in structure. Pulmonic valve regurgitation is mild. No evidence of pulmonic stenosis. Aorta: Aortic dilatation noted. There is mild dilatation of  the aortic root, measuring 39 mm. There is mild dilatation of the ascending aorta, measuring 40 mm. Venous: The inferior vena cava is dilated in size with less than 50% respiratory variability, suggesting right atrial pressure of 15 mmHg. IAS/Shunts: No atrial level shunt detected by color flow Doppler.  LEFT VENTRICLE PLAX 2D LVIDd:         3.90 cm  Diastology LVIDs:         2.90 cm  LV e' medial:    6.32 cm/s LV PW:         1.10 cm  LV E/e' medial:  8.4 LV IVS:        1.20 cm  LV e' lateral:   7.23 cm/s LVOT diam:     2.30 cm  LV E/e' lateral: 7.3 LV SV:         55 LV SV Index:    30 LVOT Area:     4.15 cm  LEFT ATRIUM             Index       RIGHT ATRIUM           Index LA diam:        3.10 cm 1.70 cm/m  RA Area:     21.90 cm LA Vol (A2C):   47.8 ml 26.20 ml/m RA Volume:   69.80 ml  38.25 ml/m LA Vol (A4C):   38.8 ml 21.26 ml/m LA Biplane Vol: 45.1 ml 24.72 ml/m  AORTIC VALVE LVOT Vmax:   76.00 cm/s LVOT Vmean:  49.700 cm/s LVOT VTI:    0.133 m  AORTA Ao Root diam: 3.90 cm Ao Asc diam:  4.00 cm MITRAL VALVE               TRICUSPID VALVE MV Area (PHT): 3.48 cm    TR Peak grad:   19.5 mmHg MV Decel Time: 218 msec    TR Vmax:        221.00 cm/s MV E velocity: 52.80 cm/s MV A velocity: 78.20 cm/s  SHUNTS MV E/A ratio:  0.68        Systemic VTI:  0.13 m                            Systemic Diam: 2.30 cm Arvilla Meres MD Electronically signed by Arvilla Meres MD Signature Date/Time: 12/27/2020/2:34:21 PM    Final    VAS [place] LOWER EXTREMITY VENOUS (DVT)  Result Date: 12/27/2020  Lower Venous DVT Study Patient Name:  Geoffrey Wood  Date of Exam:   12/27/2020 Medical Rec #: [id number]        Accession #:    [id number] Date of Birth: 1946/06/13         Patient Gender: M Patient Age:   75Y Exam Location[facility] Procedure:      VAS [place] LOWER EXTREMITY VENOUS (DVT) Referring Phys: [id number] Ivor Costa MEIER --------------------------------------------------------------------------------  Other Indications: Acute hypoxemic respiratory failure. Comparison Study: No prior. Performing Technologist: Marilynne Halsted RDMS, RVT  Examination Guidelines: A complete evaluation includes B-mode imaging, spectral Doppler, color Doppler, and power Doppler as needed of all accessible portions of each vessel. Bilateral testing is considered an integral part of a complete examination. Limited examinations for reoccurring indications may be performed as noted. The reflux portion of the exam is performed with the patient in reverse Trendelenburg.   +---------+---------------+---------+-----------+----------+--------------+ RIGHT    CompressibilityPhasicitySpontaneityPropertiesThrombus Aging +---------+---------------+---------+-----------+----------+--------------+ CFV  Full           Yes      Yes                                 +---------+---------------+---------+-----------+----------+--------------+ SFJ      Full                                                        +---------+---------------+---------+-----------+----------+--------------+ FV Prox  Full                                                        +---------+---------------+---------+-----------+----------+--------------+ FV Mid   Full                                                        +---------+---------------+---------+-----------+----------+--------------+ FV DistalFull                                                        +---------+---------------+---------+-----------+----------+--------------+ PFV      Full                                                        +---------+---------------+---------+-----------+----------+--------------+ POP      Full           Yes      Yes                                 +---------+---------------+---------+-----------+----------+--------------+ PTV      Full                                                        +---------+---------------+---------+-----------+----------+--------------+ PERO     Full                                                        +---------+---------------+---------+-----------+----------+--------------+   +---------+---------------+---------+-----------+----------+--------------+ LEFT     CompressibilityPhasicitySpontaneityPropertiesThrombus Aging +---------+---------------+---------+-----------+----------+--------------+ CFV      Full           Yes      Yes                                  +---------+---------------+---------+-----------+----------+--------------+  SFJ      Full                                                        +---------+---------------+---------+-----------+----------+--------------+ FV Prox  Full                                                        +---------+---------------+---------+-----------+----------+--------------+ FV Mid   Full                                                        +---------+---------------+---------+-----------+----------+--------------+ FV DistalFull                                                        +---------+---------------+---------+-----------+----------+--------------+ PFV      Full                                                        +---------+---------------+---------+-----------+----------+--------------+ POP      Full           Yes      Yes                                 +---------+---------------+---------+-----------+----------+--------------+ PTV      Full                                                        +---------+---------------+---------+-----------+----------+--------------+ PERO     Full                                                        +---------+---------------+---------+-----------+----------+--------------+     Summary: BILATERAL: - No evidence of deep vein thrombosis seen in the lower extremities, bilaterally. -No evidence of popliteal cyst, bilaterally.   *See table(s) above for measurements and observations.    Preliminary     Microbiology Recent Results (from the past 240 hour(s))  Resp Panel by RT-PCR (Flu A&B, Covid) Nasopharyngeal Swab     Status: None   Collection Time: 01/06/2021  9:48 PM   Specimen: Nasopharyngeal Swab; Nasopharyngeal(NP) swabs in vial transport medium  Result Value Ref Range Status   SARS Coronavirus 2 by RT PCR NEGATIVE NEGATIVE Final    Comment: (NOTE) SARS-CoV-2 target nucleic acids are NOT  DETECTED.  The  SARS-CoV-2 RNA is generally detectable in upper respiratory specimens during the acute phase of infection. The lowest concentration of SARS-CoV-2 viral copies this assay can detect is 138 copies/mL. A negative result does not preclude SARS-Cov-2 infection and should not be used as the sole basis for treatment or other patient management decisions. A negative result may occur with  improper specimen collection/handling, submission of specimen other than nasopharyngeal swab, presence of viral mutation(s) within the areas targeted by this assay, and inadequate number of viral copies(<138 copies/mL). A negative result must be combined with clinical observations, patient history, and epidemiological information. The expected result is Negative.  Fact Sheet for Patients:  BloggerCourse.comhttps://www.fda.gov/media/152166/download  Fact Sheet for Healthcare Providers:  SeriousBroker.ithttps://www.fda.gov/media/152162/download  This test is no t yet approved or cleared by the [place] FDA and  has been authorized for detection and/or diagnosis of SARS-CoV-2 by FDA under an Emergency Use Authorization (EUA). This EUA will remain  in effect (meaning this test can be used) for the duration of the COVID-19 declaration under Section 564(b)(1) of the Act, 21 U.S.C.section 360bbb-3(b)(1), unless the authorization is terminated  or revoked sooner.       Influenza A by PCR NEGATIVE NEGATIVE Final   Influenza B by PCR NEGATIVE NEGATIVE Final    Comment: (NOTE) The Xpert Xpress SARS-CoV-2/FLU/RSV plus assay is intended as an aid in the diagnosis of influenza from Nasopharyngeal swab specimens and should not be used as a sole basis for treatment. Nasal washings and aspirates are unacceptable for Xpert Xpress SARS-CoV-2/FLU/RSV testing.  Fact Sheet for Patients: BloggerCourse.comhttps://www.fda.gov/media/152166/download  Fact Sheet for Healthcare Providers: SeriousBroker.ithttps://www.fda.gov/media/152162/download  This test is not yet approved or  cleared by the [place] FDA and has been authorized for detection and/or diagnosis of SARS-CoV-2 by FDA under an Emergency Use Authorization (EUA). This EUA will remain in effect (meaning this test can be used) for the duration of the COVID-19 declaration under Section 564(b)(1) of the Act, 21 U.S.C. section 360bbb-3(b)(1), unless the authorization is terminated or revoked.  Performed at [facility], [address] [place], [place] [address]   Culture, blood (routine x 2)     Status: None (Preliminary result)   Collection Time: 12/27/20 12:05 AM   Specimen: BLOOD  Result Value Ref Range Status   Specimen Description BLOOD RIGHT ARM  Final   Special Requests   Final    BOTTLES DRAWN AEROBIC AND ANAEROBIC Blood Culture adequate volume   Culture   Final    NO GROWTH 2 DAYS Performed at [facility], [address] [place], [place] [address]    Report Status PENDING  Incomplete  MRSA PCR Screening     Status: None   Collection Time: 12/27/20  2:37 AM   Specimen: Nasal Mucosa; Nasopharyngeal  Result Value Ref Range Status   MRSA by PCR NEGATIVE NEGATIVE Final    Comment:        The GeneXpert MRSA Assay (FDA approved for NASAL specimens only), is one component of a comprehensive MRSA colonization surveillance program. It is not intended to diagnose MRSA infection nor to guide or monitor treatment for MRSA infections. Performed at [facility], [address] [place], [place] [address]   Culture, blood (routine x 2)     Status: None (Preliminary result)   Collection Time: 12/27/20  3:21 AM   Specimen: BLOOD  Result Value Ref Range Status   Specimen Description BLOOD RIGHT ANTECUBITAL  Final  Special Requests   Final    BOTTLES DRAWN AEROBIC AND ANAEROBIC Blood Culture adequate volume   Culture   Final    NO GROWTH 2 DAYS Performed at [facility], [place] [address] [place], [place] [address]    Report Status PENDING  Incomplete     Lab Basic Metabolic Panel: Recent Labs  Lab 12/24/20 1446 12/16/2020 2125 12/27/20 0018 12/27/20 0321 12/27/20 1315  NA 130* 131* 132* 130* 134*  K 5.2* 7.0* 5.1 5.4* 4.4  CL 94* 92*  --  92* 98  CO2 28 15*  --  26 22  GLUCOSE 352* 246*  --  440* 171*  BUN 33* 55*  --  64* 69*  CREATININE 0.99 1.87*  --  1.98* 1.66*  CALCIUM 9.2 9.1  --  8.7* 8.7*  PHOS  --   --   --  6.7*  --    Liver Function Tests: Recent Labs  Lab 12/24/20 1446 01/10/2021 2125 12/27/20 0321 12/27/20 1315  AST 14* 469* 2,338* 1,721*  ALT 11 367* 1,512* 1,590*  ALKPHOS 84 82 73 73  BILITOT 1.7* 5.1* 2.5* 2.0*  PROT 7.2 6.9 5.9* 6.3*  ALBUMIN 2.7* 2.6* 2.3* 2.4*   No results for input(s): LIPASE, AMYLASE in the last 168 hours. No results for input(s): AMMONIA in the last 168 hours. CBC: Recent Labs  Lab 12/24/20 1446 01/13/2021 2125 12/27/20 0018 12/27/20 0809 12/27/20 1315  WBC 4.4 14.7*  --  8.4 7.1  NEUTROABS 3.3 9.6*  --   --   --   HGB 9.1* 9.1* 7.1* 7.5* 7.5*  HCT 29.1* 32.4* 21.0* 24.5* 24.6*  MCV 81.1 90.8  --  82.8 82.6  PLT 48* 62*  --  38* 36*   Cardiac Enzymes: Recent Labs  Lab 01/04/2021 2325  CKTOTAL 26*   Sepsis Labs: Recent Labs  Lab 12/24/20 1446 12/16/2020 2125 12/31/2020 2315 12/27/20 0321 12/27/20 0809 12/27/20 1315  WBC 4.4 14.7*  --   --  8.4 7.1  LATICACIDVEN  --   --  10.2* 5.4*  --   --     Procedures/Operations     [employer] 2021/01/23, 3:34 PM

## 2021-01-15 DEATH — deceased

## 2021-02-27 ENCOUNTER — Ambulatory Visit: Payer: Medicare Other | Admitting: Cardiovascular Disease

## 2021-06-13 ENCOUNTER — Ambulatory Visit: Payer: Self-pay | Admitting: Radiation Oncology

## 2021-11-28 IMAGING — CT CT NECK W/ CM
4 of 5 series · 14 of 33 positions shown, 16 images · IV contrast (omnipaque)
Comparison: None.

CLINICAL DATA: Carcinoma of the right parotid gland

EXAM:
CT NECK WITH CONTRAST
TECHNIQUE: Multidetector CT imaging of the neck was performed using the
standard protocol following the bolus administration of intravenous
contrast.
CONTRAST:  75mL OMNIPAQUE IOHEXOL 300 MG/ML  SOLN

[Series 4: axial bone · axial · 0.50mm/px · z∈[-297,-169]mm · 3 of 129 slices shown, 4 images]
[im 33/129  soft-tissue]
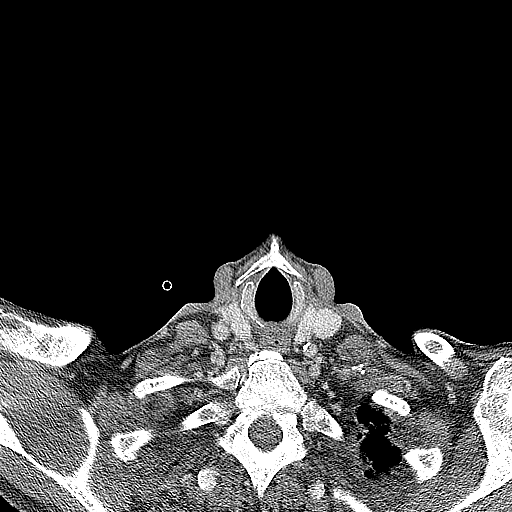
[im 33/129  bone]
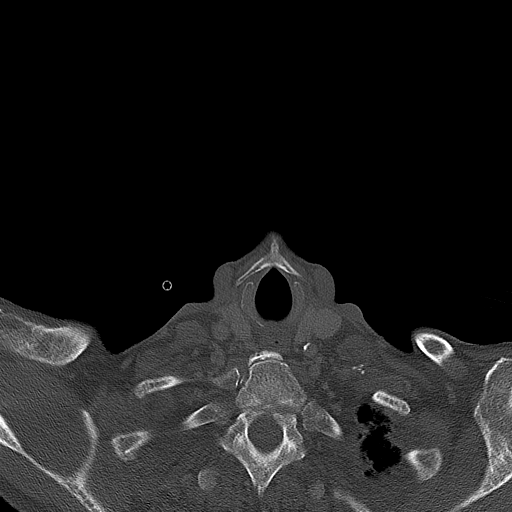
[im 65/129  bone]
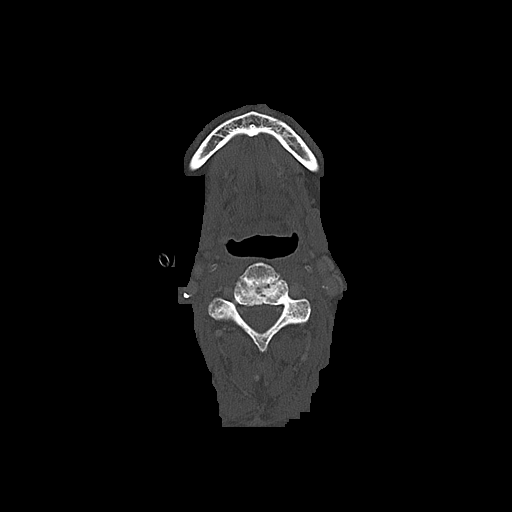
[im 97/129  bone]
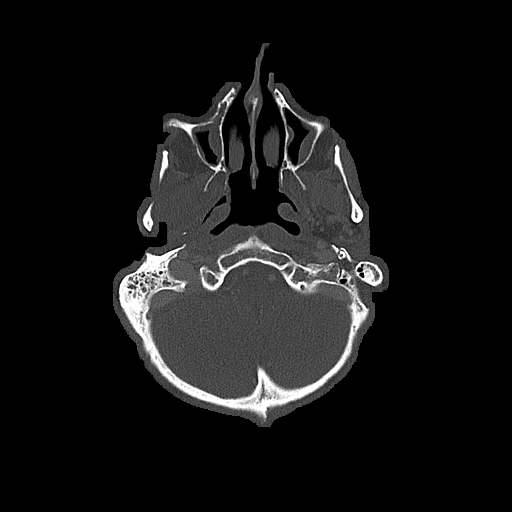

[Series 5: orthogonal (person_name) · axial · 0.43mm/px · z∈[-299,-169]mm · 3 of 127 slices shown]
[im 32/127  bone]
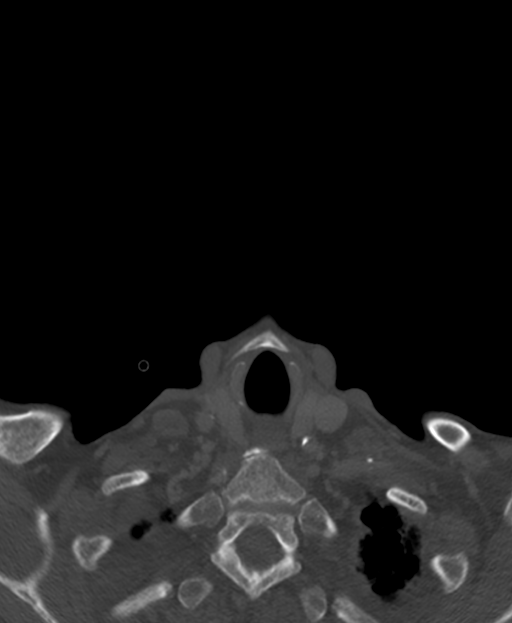
[im 64/127  bone]
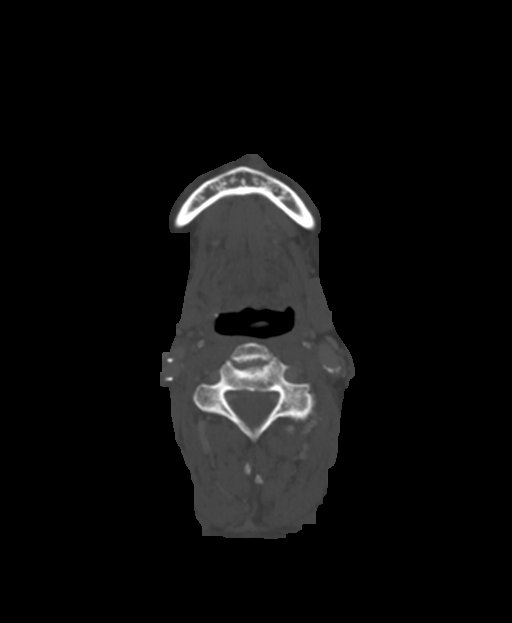
[im 95/127  bone]
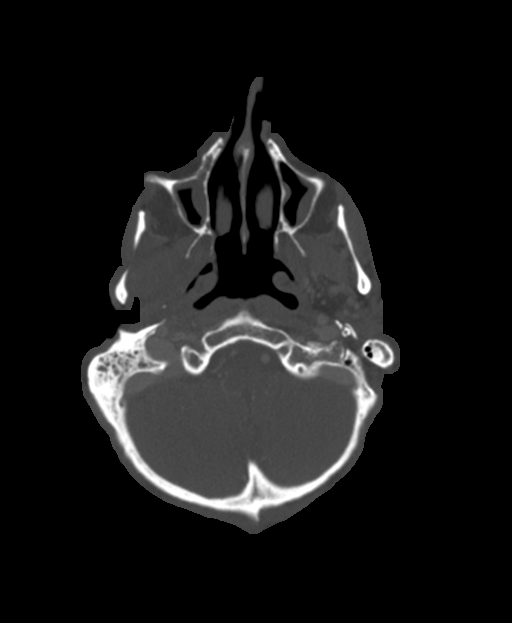

[Series 6: cor neck · coronal · 0.40mm/px · 3 of 117 slices shown]
[im 24/117  bone]
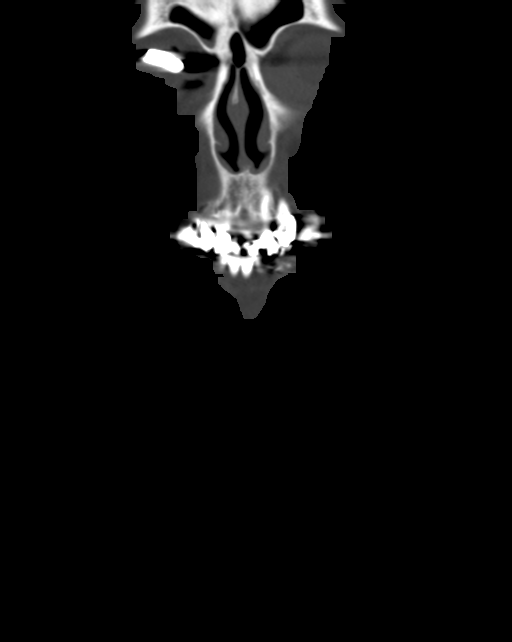
[im 47/117  bone]
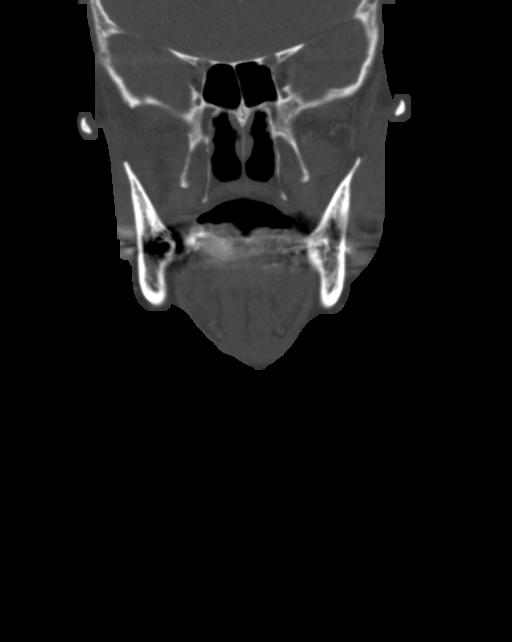
[im 70/117  bone]
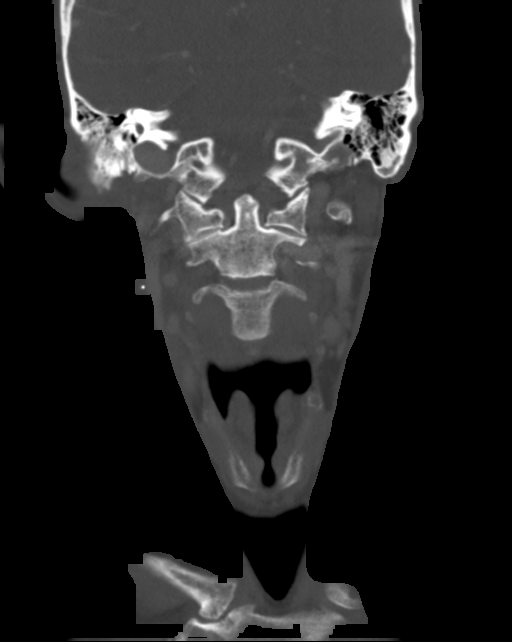

[Series 7: sag neck · sagittal · 0.50mm/px · 5 of 106 slices shown, 6 images]
[im 36/106  bone]
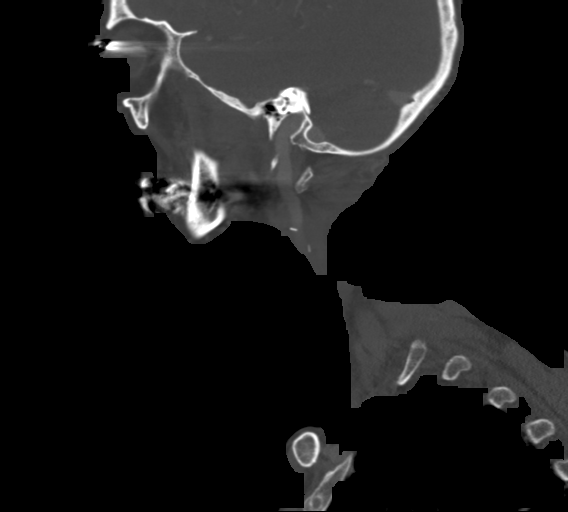
[im 44/106  bone]
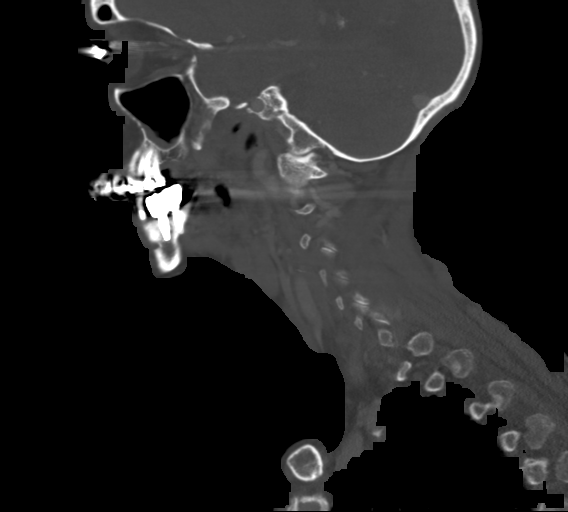
[im 53/106  soft-tissue]
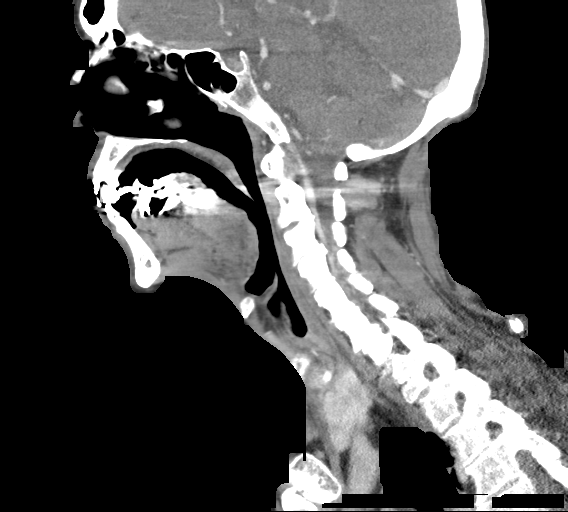
[im 53/106  bone]
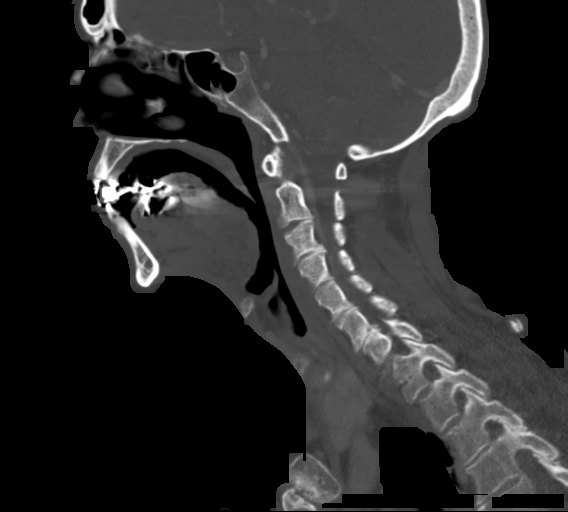
[im 62/106  bone]
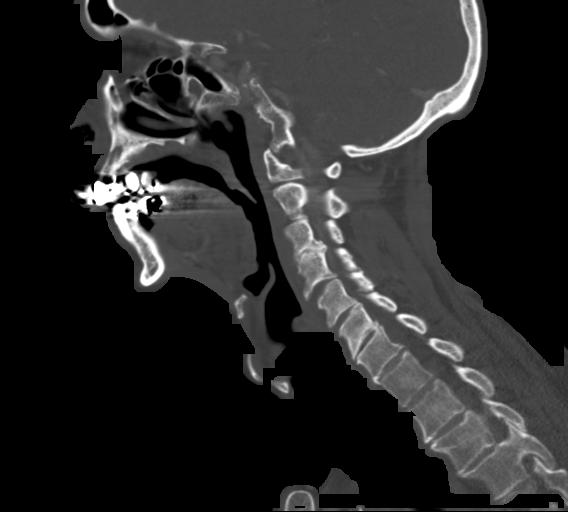
[im 71/106  bone]
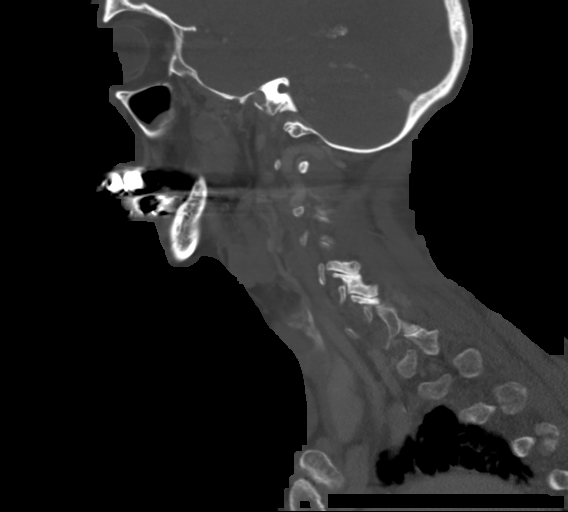

[14 of 33 positions shown; findings below may reference images not displayed]

FINDINGS: PHARYNX AND LARYNX: The nasopharynx, oropharynx and larynx are
normal. Visible portions of the oral cavity, tongue base and floor
of mouth are normal. Normal epiglottis, vallecula and pyriform
sinuses. The larynx is normal. No retropharyngeal abscess, effusion
or lymphadenopathy.

SALIVARY GLANDS: Right parotid gland is absent. The region of the
right parotid gland is obscured by streak artifact from dental
amalgam but there is no visible soft tissue mass. Left parotid gland
is normal. Normal submandibular glands.

THYROID: Normal.

LYMPH NODES: No enlarged or abnormal density lymph nodes.

VASCULAR: Calcific atherosclerosis of the aorta and the carotid
bifurcations

LIMITED INTRACRANIAL: Normal

VISUALIZED ORBITS: Metallic eyelid implant on the right.

MASTOIDS AND VISUALIZED PARANASAL SINUSES: No fluid levels or
advanced mucosal thickening. No mastoid effusion.

SKELETON: No bony spinal canal stenosis. No lytic or blastic
lesions.

UPPER CHEST: Severe biapical emphysema.

OTHER: None.
IMPRESSION: 1. Status post right parotid gland resection. The region of the
right parotid gland is obscured by streak artifact from dental
amalgam but there is no visible soft tissue mass.
2. No cervical lymphadenopathy.

Aortic Atherosclerosis (79C6N-8RS.S) and Emphysema (79C6N-M39.F).

## 2021-12-19 IMAGING — DX DG CHEST 1V PORT
1 series · 1 of 1 positions shown · non-contrast
Comparison: 11/28/2020

CLINICAL DATA: Shortness of breath

EXAM:
PORTABLE CHEST 1 VIEW

[chest ap]
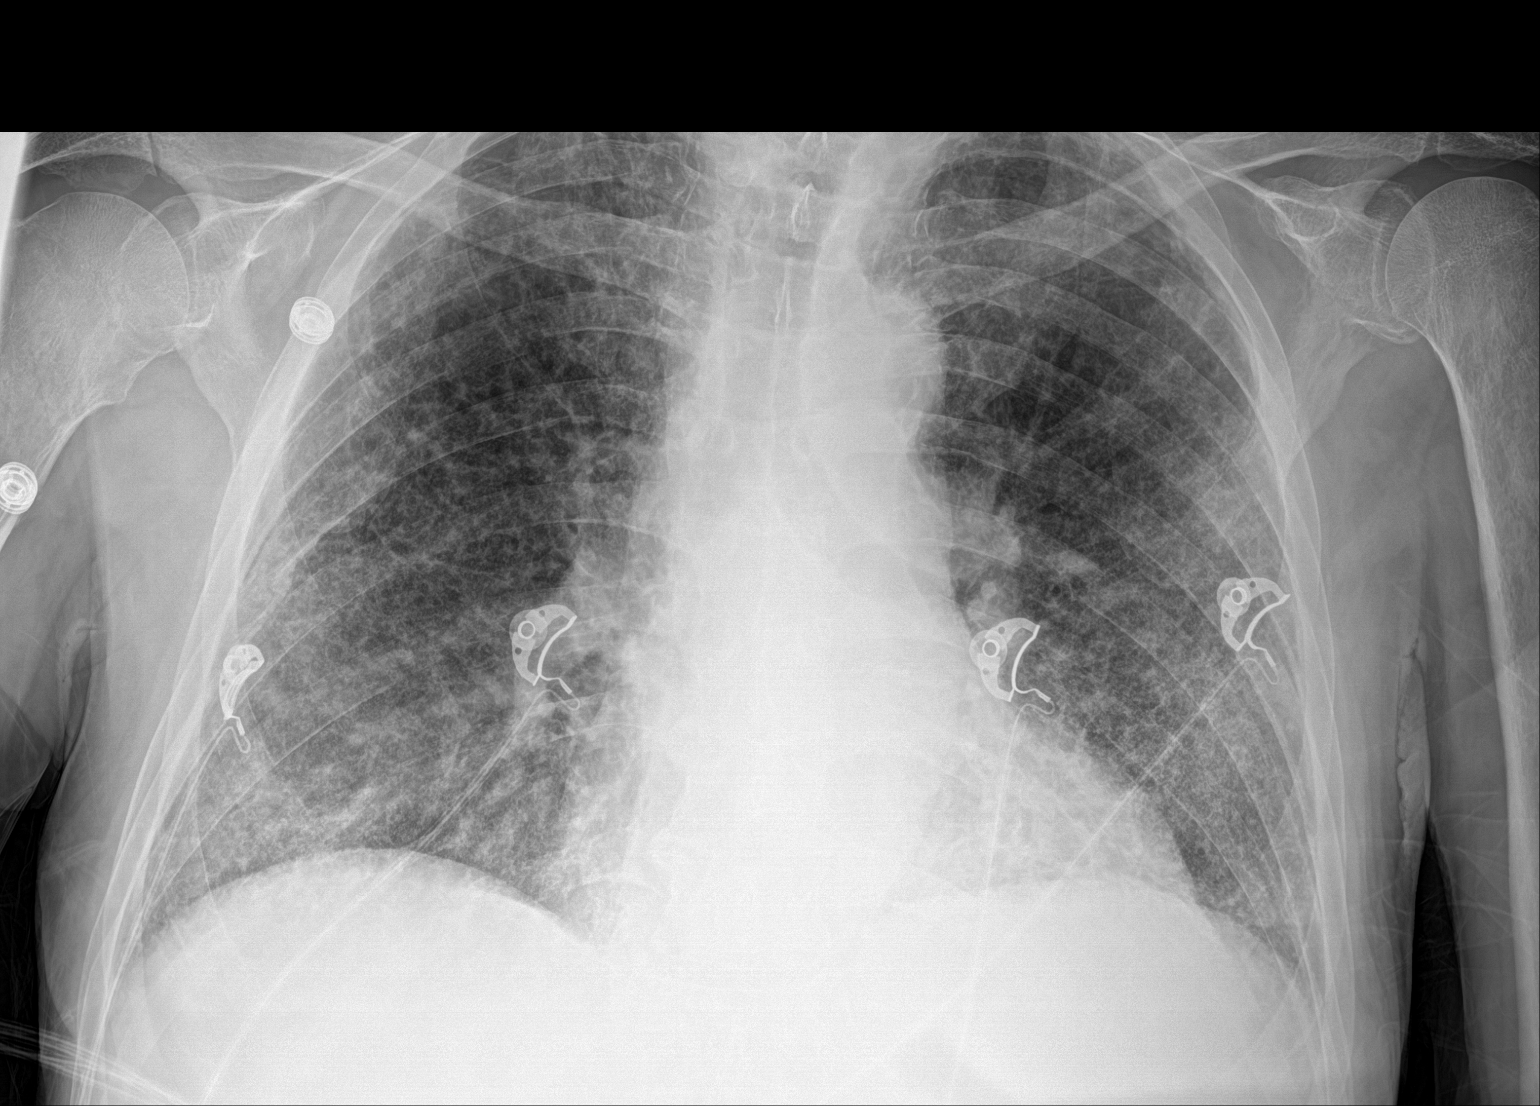

[1 of 1 positions shown; findings below may reference images not displayed]

FINDINGS: Cardiac shadow is stable. Diffuse fibrotic changes are again
identified throughout both lungs. No focal confluent infiltrate or
effusion is seen. Bony structures are within normal limits.
IMPRESSION: Chronic fibrotic changes bilaterally.  No acute abnormality noted.

## 2024-05-15 NOTE — Telephone Encounter (Signed)
 error
